# Patient Record
Sex: Female | Born: 1945
Health system: Southern US, Community
[De-identification: ages and names within clinical notes are randomized; demographics above are authoritative.]

## PROBLEM LIST (undated history)

## (undated) DIAGNOSIS — K219 Gastro-esophageal reflux disease without esophagitis: Secondary | ICD-10-CM

## (undated) DIAGNOSIS — M1990 Unspecified osteoarthritis, unspecified site: Secondary | ICD-10-CM

## (undated) DIAGNOSIS — F29 Unspecified psychosis not due to a substance or known physiological condition: Secondary | ICD-10-CM

## (undated) DIAGNOSIS — G4733 Obstructive sleep apnea (adult) (pediatric): Secondary | ICD-10-CM

## (undated) DIAGNOSIS — E041 Nontoxic single thyroid nodule: Secondary | ICD-10-CM

## (undated) DIAGNOSIS — G4700 Insomnia, unspecified: Secondary | ICD-10-CM

## (undated) DIAGNOSIS — M549 Dorsalgia, unspecified: Secondary | ICD-10-CM

## (undated) DIAGNOSIS — E785 Hyperlipidemia, unspecified: Secondary | ICD-10-CM

## (undated) DIAGNOSIS — I10 Essential (primary) hypertension: Secondary | ICD-10-CM

## (undated) DIAGNOSIS — R42 Dizziness and giddiness: Secondary | ICD-10-CM

## (undated) HISTORY — DX: Insomnia, unspecified: G47.00

## (undated) HISTORY — PX: UTERINE FIBROID SURGERY: SHX826

## (undated) HISTORY — DX: Unspecified psychosis not due to a substance or known physiological condition: F29

## (undated) HISTORY — DX: Nontoxic single thyroid nodule: E04.1

## (undated) HISTORY — DX: Obstructive sleep apnea (adult) (pediatric): G47.33

## (undated) HISTORY — DX: Unspecified osteoarthritis, unspecified site: M19.90

## (undated) HISTORY — PX: TONSILLECTOMY: SUR1361

## (undated) HISTORY — DX: Essential (primary) hypertension: I10

## (undated) HISTORY — DX: Hyperlipidemia, unspecified: E78.5

## (undated) HISTORY — DX: Dizziness and giddiness: R42

---

## 2004-01-06 ENCOUNTER — Encounter (HOSPITAL_COMMUNITY): Admission: RE | Admit: 2004-01-06 | Discharge: 2004-04-05 | Payer: Self-pay | Admitting: Family Medicine

## 2004-06-26 ENCOUNTER — Ambulatory Visit (HOSPITAL_COMMUNITY): Admission: RE | Admit: 2004-06-26 | Discharge: 2004-06-26 | Payer: Self-pay | Admitting: Internal Medicine

## 2004-06-26 ENCOUNTER — Ambulatory Visit: Payer: Self-pay | Admitting: Internal Medicine

## 2004-07-07 ENCOUNTER — Encounter (INDEPENDENT_AMBULATORY_CARE_PROVIDER_SITE_OTHER): Payer: Self-pay | Admitting: Cardiology

## 2004-07-07 ENCOUNTER — Ambulatory Visit (HOSPITAL_COMMUNITY): Admission: RE | Admit: 2004-07-07 | Discharge: 2004-07-07 | Payer: Self-pay | Admitting: Internal Medicine

## 2004-07-14 ENCOUNTER — Ambulatory Visit: Payer: Self-pay | Admitting: Internal Medicine

## 2004-07-27 ENCOUNTER — Ambulatory Visit: Payer: Self-pay | Admitting: Internal Medicine

## 2004-08-10 ENCOUNTER — Ambulatory Visit: Payer: Self-pay | Admitting: Internal Medicine

## 2004-09-24 ENCOUNTER — Ambulatory Visit: Payer: Self-pay | Admitting: Psychiatry

## 2004-09-24 ENCOUNTER — Ambulatory Visit: Payer: Self-pay | Admitting: Internal Medicine

## 2004-09-24 ENCOUNTER — Inpatient Hospital Stay (HOSPITAL_COMMUNITY): Admission: RE | Admit: 2004-09-24 | Discharge: 2004-10-01 | Payer: Self-pay | Admitting: Psychiatry

## 2004-11-18 ENCOUNTER — Ambulatory Visit: Payer: Self-pay | Admitting: Internal Medicine

## 2004-12-26 ENCOUNTER — Emergency Department (HOSPITAL_COMMUNITY): Admission: EM | Admit: 2004-12-26 | Discharge: 2004-12-26 | Payer: Self-pay | Admitting: Family Medicine

## 2005-03-05 ENCOUNTER — Ambulatory Visit: Payer: Self-pay | Admitting: Internal Medicine

## 2005-04-07 ENCOUNTER — Ambulatory Visit: Payer: Self-pay | Admitting: Internal Medicine

## 2005-04-09 ENCOUNTER — Ambulatory Visit: Payer: Self-pay | Admitting: Internal Medicine

## 2005-04-23 ENCOUNTER — Ambulatory Visit: Payer: Self-pay | Admitting: Internal Medicine

## 2005-05-07 ENCOUNTER — Ambulatory Visit: Payer: Self-pay | Admitting: Internal Medicine

## 2005-07-29 ENCOUNTER — Ambulatory Visit: Payer: Self-pay | Admitting: Internal Medicine

## 2005-11-30 DIAGNOSIS — E785 Hyperlipidemia, unspecified: Secondary | ICD-10-CM | POA: Insufficient documentation

## 2005-11-30 DIAGNOSIS — M67919 Unspecified disorder of synovium and tendon, unspecified shoulder: Secondary | ICD-10-CM | POA: Insufficient documentation

## 2005-11-30 DIAGNOSIS — R002 Palpitations: Secondary | ICD-10-CM | POA: Insufficient documentation

## 2005-11-30 DIAGNOSIS — F411 Generalized anxiety disorder: Secondary | ICD-10-CM | POA: Insufficient documentation

## 2005-11-30 DIAGNOSIS — I10 Essential (primary) hypertension: Secondary | ICD-10-CM | POA: Insufficient documentation

## 2005-11-30 DIAGNOSIS — G43909 Migraine, unspecified, not intractable, without status migrainosus: Secondary | ICD-10-CM | POA: Insufficient documentation

## 2005-11-30 DIAGNOSIS — R87615 Unsatisfactory cytologic smear of cervix: Secondary | ICD-10-CM | POA: Insufficient documentation

## 2005-11-30 DIAGNOSIS — M719 Bursopathy, unspecified: Secondary | ICD-10-CM

## 2005-11-30 DIAGNOSIS — M545 Low back pain, unspecified: Secondary | ICD-10-CM | POA: Insufficient documentation

## 2005-11-30 DIAGNOSIS — F29 Unspecified psychosis not due to a substance or known physiological condition: Secondary | ICD-10-CM | POA: Insufficient documentation

## 2005-12-10 ENCOUNTER — Ambulatory Visit: Payer: Self-pay | Admitting: Internal Medicine

## 2005-12-10 ENCOUNTER — Ambulatory Visit (HOSPITAL_COMMUNITY): Admission: RE | Admit: 2005-12-10 | Discharge: 2005-12-10 | Payer: Self-pay | Admitting: Internal Medicine

## 2006-01-20 ENCOUNTER — Ambulatory Visit: Payer: Self-pay | Admitting: Internal Medicine

## 2006-01-22 ENCOUNTER — Ambulatory Visit (HOSPITAL_COMMUNITY): Admission: RE | Admit: 2006-01-22 | Discharge: 2006-01-22 | Payer: Self-pay | Admitting: Internal Medicine

## 2006-01-25 ENCOUNTER — Ambulatory Visit: Payer: Self-pay | Admitting: Internal Medicine

## 2006-02-18 ENCOUNTER — Telehealth (INDEPENDENT_AMBULATORY_CARE_PROVIDER_SITE_OTHER): Payer: Self-pay | Admitting: *Deleted

## 2006-03-17 ENCOUNTER — Encounter: Admission: RE | Admit: 2006-03-17 | Discharge: 2006-03-17 | Payer: Self-pay | Admitting: Hospitalist

## 2006-03-17 ENCOUNTER — Encounter (INDEPENDENT_AMBULATORY_CARE_PROVIDER_SITE_OTHER): Payer: Self-pay | Admitting: Hospitalist

## 2006-03-17 ENCOUNTER — Ambulatory Visit: Payer: Self-pay | Admitting: Internal Medicine

## 2006-03-17 LAB — CONVERTED CEMR LAB
Cholesterol: 135 mg/dL (ref 0–200)
HDL: 58 mg/dL (ref >39)
LDL Cholesterol: 59 mg/dL (ref 0–99)
Total CHOL/HDL Ratio: 2.3
Triglycerides: 88 mg/dL (ref <150)
VLDL: 18 mg/dL (ref 0–40)

## 2006-03-21 ENCOUNTER — Encounter: Admission: RE | Admit: 2006-03-21 | Discharge: 2006-05-25 | Payer: Self-pay | Admitting: Sports Medicine

## 2006-03-23 ENCOUNTER — Telehealth (INDEPENDENT_AMBULATORY_CARE_PROVIDER_SITE_OTHER): Payer: Self-pay | Admitting: *Deleted

## 2006-04-14 ENCOUNTER — Ambulatory Visit: Payer: Self-pay | Admitting: Internal Medicine

## 2006-07-28 ENCOUNTER — Ambulatory Visit: Payer: Self-pay | Admitting: Internal Medicine

## 2006-07-28 DIAGNOSIS — G4700 Insomnia, unspecified: Secondary | ICD-10-CM | POA: Insufficient documentation

## 2006-08-26 ENCOUNTER — Encounter (INDEPENDENT_AMBULATORY_CARE_PROVIDER_SITE_OTHER): Payer: Self-pay | Admitting: *Deleted

## 2006-11-03 ENCOUNTER — Telehealth: Payer: Self-pay | Admitting: *Deleted

## 2006-12-15 ENCOUNTER — Telehealth: Payer: Self-pay | Admitting: *Deleted

## 2007-01-25 ENCOUNTER — Ambulatory Visit: Payer: Self-pay | Admitting: *Deleted

## 2007-01-25 DIAGNOSIS — T148XXA Other injury of unspecified body region, initial encounter: Secondary | ICD-10-CM | POA: Insufficient documentation

## 2007-01-25 LAB — CONVERTED CEMR LAB
ALT: 10 units/L (ref 0–35)
AST: 13 units/L (ref 0–37)
Albumin: 4.3 g/dL (ref 3.5–5.2)
Alkaline Phosphatase: 57 units/L (ref 39–117)
BUN: 9 mg/dL (ref 6–23)
CO2: 27 meq/L (ref 19–32)
Calcium: 9.4 mg/dL (ref 8.4–10.5)
Chloride: 106 meq/L (ref 96–112)
Creatinine, Ser: 0.76 mg/dL (ref 0.40–1.20)
Glucose, Bld: 84 mg/dL (ref 70–99)
HCT: 40.8 % (ref 36.0–46.0)
Hemoglobin: 13.2 g/dL (ref 12.0–15.0)
MCHC: 32.4 g/dL (ref 30.0–36.0)
MCV: 87.7 fL (ref 78.0–100.0)
Platelets: 191 10*3/uL (ref 150–400)
Potassium: 4.3 meq/L (ref 3.5–5.3)
RBC: 4.65 M/uL (ref 3.87–5.11)
RDW: 15.2 % (ref 11.5–15.5)
Sodium: 144 meq/L (ref 135–145)
Total Bilirubin: 0.4 mg/dL (ref 0.3–1.2)
Total Protein: 7 g/dL (ref 6.0–8.3)
WBC: 7.5 10*3/uL (ref 4.0–10.5)

## 2007-07-03 ENCOUNTER — Encounter: Admission: RE | Admit: 2007-07-03 | Discharge: 2007-07-03 | Payer: Self-pay | Admitting: *Deleted

## 2007-07-03 ENCOUNTER — Ambulatory Visit (HOSPITAL_COMMUNITY): Admission: RE | Admit: 2007-07-03 | Discharge: 2007-07-03 | Payer: Self-pay | Admitting: Internal Medicine

## 2007-07-03 ENCOUNTER — Encounter: Payer: Self-pay | Admitting: Internal Medicine

## 2007-07-03 ENCOUNTER — Telehealth (INDEPENDENT_AMBULATORY_CARE_PROVIDER_SITE_OTHER): Payer: Self-pay | Admitting: *Deleted

## 2007-07-03 ENCOUNTER — Ambulatory Visit: Payer: Self-pay | Admitting: *Deleted

## 2007-07-03 LAB — CONVERTED CEMR LAB
ALT: 13 units/L (ref 0–35)
AST: 19 units/L (ref 0–37)
Albumin: 4 g/dL (ref 3.5–5.2)
Alkaline Phosphatase: 56 units/L (ref 39–117)
BUN: 9 mg/dL (ref 6–23)
Basophils Absolute: 0.1 10*3/uL (ref 0.0–0.1)
Basophils Relative: 1 % (ref 0–1)
CO2: 28 meq/L (ref 19–32)
Calcium: 9.4 mg/dL (ref 8.4–10.5)
Chloride: 104 meq/L (ref 96–112)
Creatinine, Ser: 0.78 mg/dL (ref 0.40–1.20)
Eosinophils Absolute: 0.4 10*3/uL (ref 0.0–0.7)
Eosinophils Relative: 5 % (ref 0–5)
Glucose, Bld: 98 mg/dL (ref 70–99)
HCT: 39.5 % (ref 36.0–46.0)
Hemoglobin: 13.6 g/dL (ref 12.0–15.0)
INR: 1 (ref 0.0–1.5)
Lymphocytes Relative: 25 % (ref 12–46)
Lymphs Abs: 2.1 10*3/uL (ref 0.7–4.0)
MCHC: 34.3 g/dL (ref 30.0–36.0)
MCV: 86.9 fL (ref 78.0–100.0)
Monocytes Absolute: 0.6 10*3/uL (ref 0.1–1.0)
Monocytes Relative: 7 % (ref 3–12)
Neutro Abs: 5.4 10*3/uL (ref 1.7–7.7)
Neutrophils Relative %: 63 % (ref 43–77)
Platelets: 155 10*3/uL (ref 150–400)
Potassium: 4.2 meq/L (ref 3.5–5.3)
Prothrombin Time: 13.6 s (ref 11.6–15.2)
RBC: 4.54 M/uL (ref 3.87–5.11)
RDW: 14.7 % (ref 11.5–15.5)
Sodium: 140 meq/L (ref 135–145)
TSH: 0.167 microintl units/mL — ABNORMAL LOW (ref 0.350–5.50)
Total Bilirubin: 0.6 mg/dL (ref 0.3–1.2)
Total Protein: 6.7 g/dL (ref 6.0–8.3)
WBC: 8.6 10*3/uL (ref 4.0–10.5)
aPTT: 31 s

## 2007-07-25 ENCOUNTER — Encounter (INDEPENDENT_AMBULATORY_CARE_PROVIDER_SITE_OTHER): Payer: Self-pay | Admitting: *Deleted

## 2007-07-25 ENCOUNTER — Ambulatory Visit: Payer: Self-pay | Admitting: *Deleted

## 2007-07-25 DIAGNOSIS — R791 Abnormal coagulation profile: Secondary | ICD-10-CM | POA: Insufficient documentation

## 2007-07-25 LAB — CONVERTED CEMR LAB
ALT: 9 units/L (ref 0–35)
AST: 14 units/L (ref 0–37)
Albumin: 4.4 g/dL (ref 3.5–5.2)
Alkaline Phosphatase: 55 units/L (ref 39–117)
BUN: 8 mg/dL (ref 6–23)
CO2: 24 meq/L (ref 19–32)
Calcium: 9.2 mg/dL (ref 8.4–10.5)
Chlamydia, DNA Probe: NEGATIVE
Chloride: 106 meq/L (ref 96–112)
Cholesterol: 159 mg/dL (ref 0–200)
Creatinine, Ser: 0.66 mg/dL (ref 0.40–1.20)
Free T4: 1.12 ng/dL (ref 0.89–1.80)
GC Probe Amp, Genital: NEGATIVE
Glucose, Bld: 88 mg/dL (ref 70–99)
HDL: 74 mg/dL (ref >39)
LDL Cholesterol: 71 mg/dL (ref 0–99)
Potassium: 3.8 meq/L (ref 3.5–5.3)
Sodium: 142 meq/L (ref 135–145)
Total Bilirubin: 0.4 mg/dL (ref 0.3–1.2)
Total CHOL/HDL Ratio: 2.1
Total Protein: 7 g/dL (ref 6.0–8.3)
Triglycerides: 71 mg/dL (ref <150)
VLDL: 14 mg/dL (ref 0–40)

## 2007-09-29 ENCOUNTER — Encounter (INDEPENDENT_AMBULATORY_CARE_PROVIDER_SITE_OTHER): Payer: Self-pay | Admitting: *Deleted

## 2007-09-29 LAB — HM MAMMOGRAPHY: HM Mammogram: NEGATIVE

## 2007-10-04 ENCOUNTER — Telehealth (INDEPENDENT_AMBULATORY_CARE_PROVIDER_SITE_OTHER): Payer: Self-pay | Admitting: *Deleted

## 2007-12-06 ENCOUNTER — Telehealth: Payer: Self-pay | Admitting: *Deleted

## 2007-12-07 DIAGNOSIS — M79609 Pain in unspecified limb: Secondary | ICD-10-CM | POA: Insufficient documentation

## 2008-02-16 DIAGNOSIS — E041 Nontoxic single thyroid nodule: Secondary | ICD-10-CM

## 2008-02-16 HISTORY — DX: Nontoxic single thyroid nodule: E04.1

## 2008-03-19 ENCOUNTER — Encounter (INDEPENDENT_AMBULATORY_CARE_PROVIDER_SITE_OTHER): Payer: Self-pay | Admitting: *Deleted

## 2008-04-09 ENCOUNTER — Ambulatory Visit: Payer: Self-pay | Admitting: *Deleted

## 2008-04-10 LAB — CONVERTED CEMR LAB
ALT: 9 units/L (ref 0–35)
AST: 14 units/L (ref 0–37)
Albumin: 4.2 g/dL (ref 3.5–5.2)
Alkaline Phosphatase: 64 units/L (ref 39–117)
BUN: 7 mg/dL (ref 6–23)
CO2: 25 meq/L (ref 19–32)
Calcium: 9 mg/dL (ref 8.4–10.5)
Chloride: 104 meq/L (ref 96–112)
Cholesterol: 147 mg/dL (ref 0–200)
Creatinine, Ser: 0.68 mg/dL (ref 0.40–1.20)
Glucose, Bld: 72 mg/dL (ref 70–99)
HCT: 43.6 % (ref 36.0–46.0)
HDL: 66 mg/dL (ref >39)
Hemoglobin: 14 g/dL (ref 12.0–15.0)
LDL Cholesterol: 61 mg/dL (ref 0–99)
MCHC: 32.1 g/dL (ref 30.0–36.0)
MCV: 89.7 fL (ref 78.0–100.0)
Platelets: 169 10*3/uL (ref 150–400)
Potassium: 3.6 meq/L (ref 3.5–5.3)
RBC: 4.86 M/uL (ref 3.87–5.11)
RDW: 14.8 % (ref 11.5–15.5)
Sodium: 143 meq/L (ref 135–145)
TSH: 0.19 microintl units/mL — ABNORMAL LOW (ref 0.350–4.50)
Total Bilirubin: 0.4 mg/dL (ref 0.3–1.2)
Total CHOL/HDL Ratio: 2.2
Total Protein: 6.9 g/dL (ref 6.0–8.3)
Triglycerides: 98 mg/dL (ref <150)
VLDL: 20 mg/dL (ref 0–40)
WBC: 6.4 10*3/uL (ref 4.0–10.5)

## 2008-04-11 ENCOUNTER — Encounter: Admission: RE | Admit: 2008-04-11 | Discharge: 2008-04-11 | Payer: Self-pay

## 2008-04-11 ENCOUNTER — Other Ambulatory Visit: Admission: RE | Admit: 2008-04-11 | Discharge: 2008-04-11 | Payer: Self-pay | Admitting: Interventional Radiology

## 2008-04-11 ENCOUNTER — Encounter (INDEPENDENT_AMBULATORY_CARE_PROVIDER_SITE_OTHER): Payer: Self-pay | Admitting: *Deleted

## 2008-04-11 ENCOUNTER — Encounter (INDEPENDENT_AMBULATORY_CARE_PROVIDER_SITE_OTHER): Payer: Self-pay | Admitting: Interventional Radiology

## 2008-04-11 ENCOUNTER — Ambulatory Visit: Payer: Self-pay | Admitting: Internal Medicine

## 2008-04-18 LAB — CONVERTED CEMR LAB
Free T4: 1 ng/dL (ref 0.89–1.80)
TSH: 0.179 microintl units/mL — ABNORMAL LOW (ref 0.350–4.50)

## 2008-05-06 ENCOUNTER — Encounter (INDEPENDENT_AMBULATORY_CARE_PROVIDER_SITE_OTHER): Payer: Self-pay | Admitting: Internal Medicine

## 2008-05-06 ENCOUNTER — Telehealth: Payer: Self-pay | Admitting: *Deleted

## 2008-05-06 ENCOUNTER — Ambulatory Visit: Payer: Self-pay | Admitting: Internal Medicine

## 2008-05-07 LAB — CONVERTED CEMR LAB
BUN: 11 mg/dL (ref 6–23)
Basophils Absolute: 0 10*3/uL (ref 0.0–0.1)
Basophils Relative: 0 % (ref 0–1)
CO2: 28 meq/L (ref 19–32)
Calcium: 9.3 mg/dL (ref 8.4–10.5)
Chloride: 105 meq/L (ref 96–112)
Creatinine, Ser: 0.65 mg/dL (ref 0.40–1.20)
Eosinophils Absolute: 0.3 10*3/uL (ref 0.0–0.7)
Eosinophils Relative: 4 % (ref 0–5)
Free T4: 0.9 ng/dL (ref 0.89–1.80)
Glucose, Bld: 88 mg/dL (ref 70–99)
HCT: 40.1 % (ref 36.0–46.0)
Hemoglobin: 13.5 g/dL (ref 12.0–15.0)
Lymphocytes Relative: 37 % (ref 12–46)
Lymphs Abs: 3 10*3/uL (ref 0.7–4.0)
MCHC: 33.7 g/dL (ref 30.0–36.0)
MCV: 87 fL (ref 78.0–100.0)
Monocytes Absolute: 0.7 10*3/uL (ref 0.1–1.0)
Monocytes Relative: 8 % (ref 3–12)
Neutro Abs: 4.1 10*3/uL (ref 1.7–7.7)
Neutrophils Relative %: 51 % (ref 43–77)
Platelets: 156 10*3/uL (ref 150–400)
Potassium: 3.8 meq/L (ref 3.5–5.3)
RBC: 4.61 M/uL (ref 3.87–5.11)
RDW: 14.7 % (ref 11.5–15.5)
Sodium: 142 meq/L (ref 135–145)
TSH: 0.296 microintl units/mL — ABNORMAL LOW (ref 0.350–4.500)
WBC: 8 10*3/uL (ref 4.0–10.5)

## 2008-07-17 ENCOUNTER — Ambulatory Visit: Payer: Self-pay | Admitting: Internal Medicine

## 2008-07-17 ENCOUNTER — Ambulatory Visit (HOSPITAL_COMMUNITY): Admission: RE | Admit: 2008-07-17 | Discharge: 2008-07-17 | Payer: Self-pay | Admitting: Internal Medicine

## 2008-08-06 ENCOUNTER — Ambulatory Visit: Payer: Self-pay | Admitting: *Deleted

## 2008-08-06 LAB — CONVERTED CEMR LAB
OCCULT 1: NEGATIVE
OCCULT 2: NEGATIVE
OCCULT 3: NEGATIVE

## 2008-08-06 LAB — FECAL OCCULT BLOOD, GUAIAC: Fecal Occult Blood: NEGATIVE

## 2008-09-18 ENCOUNTER — Telehealth: Payer: Self-pay | Admitting: *Deleted

## 2008-10-14 ENCOUNTER — Telehealth: Payer: Self-pay | Admitting: Internal Medicine

## 2008-10-29 ENCOUNTER — Telehealth: Payer: Self-pay | Admitting: Internal Medicine

## 2008-10-29 DIAGNOSIS — M949 Disorder of cartilage, unspecified: Secondary | ICD-10-CM

## 2008-10-29 DIAGNOSIS — M899 Disorder of bone, unspecified: Secondary | ICD-10-CM | POA: Insufficient documentation

## 2008-11-05 ENCOUNTER — Encounter: Payer: Self-pay | Admitting: Internal Medicine

## 2008-11-07 ENCOUNTER — Telehealth: Payer: Self-pay | Admitting: Internal Medicine

## 2008-11-07 ENCOUNTER — Encounter: Payer: Self-pay | Admitting: Internal Medicine

## 2008-11-14 ENCOUNTER — Telehealth: Payer: Self-pay | Admitting: Internal Medicine

## 2008-11-15 ENCOUNTER — Emergency Department (HOSPITAL_COMMUNITY): Admission: EM | Admit: 2008-11-15 | Discharge: 2008-11-15 | Payer: Self-pay | Admitting: Emergency Medicine

## 2008-11-15 ENCOUNTER — Telehealth: Payer: Self-pay | Admitting: Internal Medicine

## 2008-11-19 ENCOUNTER — Ambulatory Visit: Payer: Self-pay | Admitting: Internal Medicine

## 2008-11-19 DIAGNOSIS — H8110 Benign paroxysmal vertigo, unspecified ear: Secondary | ICD-10-CM | POA: Insufficient documentation

## 2009-03-24 ENCOUNTER — Emergency Department (HOSPITAL_COMMUNITY): Admission: EM | Admit: 2009-03-24 | Discharge: 2009-03-25 | Payer: Self-pay | Admitting: Emergency Medicine

## 2009-03-24 ENCOUNTER — Emergency Department (HOSPITAL_COMMUNITY): Admission: EM | Admit: 2009-03-24 | Discharge: 2009-03-24 | Payer: Self-pay | Admitting: Emergency Medicine

## 2009-07-22 ENCOUNTER — Telehealth: Payer: Self-pay | Admitting: Internal Medicine

## 2009-08-08 ENCOUNTER — Other Ambulatory Visit: Payer: Self-pay | Admitting: Emergency Medicine

## 2009-08-08 ENCOUNTER — Ambulatory Visit: Payer: Self-pay | Admitting: Psychiatry

## 2009-08-08 ENCOUNTER — Inpatient Hospital Stay (HOSPITAL_COMMUNITY): Admission: AD | Admit: 2009-08-08 | Discharge: 2009-08-12 | Payer: Self-pay | Admitting: Psychiatry

## 2009-09-04 ENCOUNTER — Encounter: Payer: Self-pay | Admitting: Internal Medicine

## 2009-09-09 ENCOUNTER — Ambulatory Visit: Payer: Self-pay | Admitting: Psychiatry

## 2010-01-06 ENCOUNTER — Encounter: Payer: Self-pay | Admitting: Internal Medicine

## 2010-03-17 NOTE — Progress Notes (Signed)
Summary: med refill/gp  Phone Note Refill Request Message from:  Fax from Pharmacy on July 22, 2009 12:27 PM  Refills Requested: Medication #1:  ACCUPRIL 20 MG TABS Take 1 tablet by mouth once a day   Last Refilled: 06/17/2009  Medication #2:  ZOCOR 80 MG TABS Take 1 tablet by mouth once a day   Last Refilled: 06/17/2009 Last appt. Oct. 5, 2010.   Method Requested: Electronic Initial call taken by: Chinita Pester RN,  July 22, 2009 12:27 PM  Follow-up for Phone Call        completed refill, thank you Iskra  Follow-up by: Mliss Sax MD,  July 22, 2009 3:33 PM    Prescriptions: NORVASC 5 MG TABS (AMLODIPINE BESYLATE) Take 1 tablet by mouth once a day  #30 Tablet x 11   Entered and Authorized by:   Mliss Sax MD   Signed by:   Mliss Sax MD on 07/22/2009   Method used:   Electronically to        [facility] (retail)       [address]       [place], [place]  [address]       Ph: [phone]       Fax: [phone]   RxID:   [id number] ZOCOR 80 MG TABS (SIMVASTATIN) Take 1 tablet by mouth once a day  #30 x 11   Entered and Authorized by:   Mliss Sax MD   Signed by:   Mliss Sax MD on 07/22/2009   Method used:   Electronically to        [facility] (retail)       [address]       [place], [place]  [address]       Ph: [phone]       Fax: [phone]   RxID:   [id number] ACCUPRIL 20 MG TABS (QUINAPRIL HCL) Take 1 tablet by mouth once a day  #30 x 11   Entered and Authorized by:   Mliss Sax MD   Signed by:   Mliss Sax MD on 07/22/2009   Method used:   Electronically to        [facility] (retail)       [address]       [place], [place]  [address]       Ph: [phone]       Fax: [phone]   RxID:   [id number]

## 2010-03-17 NOTE — Assessment & Plan Note (Signed)
Summary: med change  Prescriptions: ZOCOR 40 MG TABS (SIMVASTATIN) Take 1 tab by mouth at bedtime  #30 x 5   Entered and Authorized by:   Mliss Sax MD   Signed by:   Mliss Sax MD on 09/04/2009   Method used:   Electronically to        [facility] [address] (retail)       [phone]       [place], [place]  [address]       Ph: [phone]       Fax: [phone]   RxID:   [id number]

## 2010-03-21 ENCOUNTER — Other Ambulatory Visit: Payer: Self-pay | Admitting: *Deleted

## 2010-03-23 MED ORDER — AMLODIPINE BESYLATE 5 MG PO TABS: 5.0000 mg | ORAL_TABLET | Freq: Every day | ORAL | Status: DC

## 2010-03-27 ENCOUNTER — Encounter: Payer: Self-pay | Admitting: Internal Medicine

## 2010-03-27 ENCOUNTER — Ambulatory Visit (INDEPENDENT_AMBULATORY_CARE_PROVIDER_SITE_OTHER): Payer: Medicare Other | Admitting: Internal Medicine

## 2010-03-27 VITALS — BP 125/85 | HR 86 | Temp 98.3°F | Ht 63.0 in | Wt 150.0 lb

## 2010-03-27 DIAGNOSIS — M545 Low back pain, unspecified: Secondary | ICD-10-CM

## 2010-03-27 DIAGNOSIS — G4700 Insomnia, unspecified: Secondary | ICD-10-CM

## 2010-03-27 DIAGNOSIS — E785 Hyperlipidemia, unspecified: Secondary | ICD-10-CM

## 2010-03-27 DIAGNOSIS — I10 Essential (primary) hypertension: Secondary | ICD-10-CM

## 2010-03-27 DIAGNOSIS — E041 Nontoxic single thyroid nodule: Secondary | ICD-10-CM

## 2010-03-27 LAB — COMPREHENSIVE METABOLIC PANEL
Alkaline Phosphatase: 67 U/L (ref 39–117)
Glucose, Bld: 86 mg/dL (ref 70–99)
Sodium: 142 mEq/L (ref 135–145)
Total Bilirubin: 0.4 mg/dL (ref 0.3–1.2)
Total Protein: 7.2 g/dL (ref 6.0–8.3)

## 2010-03-27 LAB — LIPID PANEL
Cholesterol: 179 mg/dL (ref 0–200)
HDL: 60 mg/dL (ref >39)
LDL Cholesterol: 97 mg/dL (ref 0–99)
Total CHOL/HDL Ratio: 3 Ratio
Triglycerides: 110 mg/dL (ref <150)
VLDL: 22 mg/dL (ref 0–40)

## 2010-03-27 MED ORDER — ZOLPIDEM TARTRATE 5 MG PO TABS: 5.0000 mg | ORAL_TABLET | Freq: Every evening | ORAL | Status: DC | PRN

## 2010-03-27 MED ORDER — ACETAMINOPHEN-CODEINE 300-30 MG PO TABS: 1.0000 | ORAL_TABLET | Freq: Three times a day (TID) | ORAL | Status: DC | PRN

## 2010-03-27 MED ORDER — CLORAZEPATE DIPOTASSIUM 7.5 MG PO TABS: 7.5000 mg | ORAL_TABLET | Freq: Three times a day (TID) | ORAL | Status: DC

## 2010-03-27 MED ORDER — MECLIZINE HCL 25 MG PO TABS: 25.0000 mg | ORAL_TABLET | Freq: Three times a day (TID) | ORAL | Status: DC | PRN

## 2010-03-27 MED ORDER — QUINAPRIL HCL 20 MG PO TABS: 20.0000 mg | ORAL_TABLET | Freq: Every day | ORAL | Status: DC

## 2010-03-27 MED ORDER — AMLODIPINE BESYLATE 5 MG PO TABS: 5.0000 mg | ORAL_TABLET | Freq: Every day | ORAL | Status: DC

## 2010-03-27 MED ORDER — RANITIDINE HCL 150 MG PO TABS: 150.0000 mg | ORAL_TABLET | Freq: Every day | ORAL | Status: DC

## 2010-03-27 MED ORDER — SIMVASTATIN 40 MG PO TABS: 40.0000 mg | ORAL_TABLET | Freq: Every day | ORAL | Status: DC

## 2010-03-27 MED ORDER — CITALOPRAM HYDROBROMIDE 40 MG PO TABS: 60.0000 mg | ORAL_TABLET | Freq: Every day | ORAL | Status: DC

## 2010-03-27 NOTE — Assessment & Plan Note (Signed)
Well controlled on current pain medication, I have also encouraged exercise daily and continuous weight loss.

## 2010-03-27 NOTE — Assessment & Plan Note (Signed)
Pt only takes as needed and not every day, will provide refills as needed.

## 2010-03-27 NOTE — Progress Notes (Signed)
  Subjective:    Patient ID: Doris [name], female    DOB: 10-16-1945, 65 y.o.   MRN: [id number]  HPI 65 yo female with PMH outlined below presents to [facility] for regular follow up and would like to get refills on her medications. She reports compliance with her BP and cholesterol medications and also occasionally has to take pain medication for her low back pain. She tells me that this is chronic pain for her and it is in the low back area, only occasionally radiates down to her right leg, pain medication help with symptoms and she takes 1-2 tablets per day. She is able to walk and work out and is trying to exercise daily. She denies any falls or recent traumas, no recent sicknesses or hospitalizations, no episodes of chest pain or palpitations, no abdominal or urinary concerns, no blood in urine or stool. She also reports having recent mammogram done which she was told has some densities and she would like to know the results.    Review of Systems  Constitutional: Negative.   HENT: Negative.   Respiratory: Negative.   Cardiovascular: Negative.   Gastrointestinal: Negative.   Genitourinary: Negative.   Musculoskeletal: Positive for back pain. Negative for joint swelling, arthralgias and gait problem.  Hematological: Negative.   Psychiatric/Behavioral: Negative.        Objective:   Physical Exam  Constitutional: She appears well-developed and well-nourished. No distress.  HENT:  Head: Normocephalic and atraumatic.  Right Ear: External ear normal.  Left Ear: External ear normal.  Nose: Nose normal.  Mouth/Throat: Oropharynx is clear and moist. No oropharyngeal exudate.  Eyes: Conjunctivae and EOM are normal. Pupils are equal, round, and reactive to light. Right eye exhibits no discharge. Left eye exhibits no discharge. No scleral icterus.  Neck: Normal range of motion. Neck supple. No JVD present. No tracheal deviation present. No thyromegaly present.  Cardiovascular: Normal rate,  regular rhythm, normal heart sounds and intact distal pulses.  Exam reveals no gallop and no friction rub.   No murmur heard. Pulmonary/Chest: Effort normal and breath sounds normal. No stridor. No respiratory distress. She has no wheezes. She has no rales. She exhibits tenderness.  Abdominal: Soft. Bowel sounds are normal. She exhibits no distension and no mass. There is no tenderness. There is no rebound and no guarding.  Musculoskeletal: Normal range of motion. She exhibits no edema.       Lumbar back: She exhibits tenderness and pain. She exhibits normal range of motion, no bony tenderness, no swelling, no edema, no deformity, no laceration, no spasm and normal pulse.  Lymphadenopathy:    She has no cervical adenopathy.  Skin: Skin is warm and dry. No rash noted. She is not diaphoretic. No erythema. No pallor.  Psychiatric: She has a normal mood and affect. Her behavior is normal. Judgment and thought content normal.          Assessment & Plan:

## 2010-03-27 NOTE — Assessment & Plan Note (Signed)
Will recheck TSH today and will follow up on results.

## 2010-03-27 NOTE — Patient Instructions (Signed)
Please schedule an appointment for follow up in 3 months.

## 2010-03-27 NOTE — Assessment & Plan Note (Signed)
Will check FLP today and will readjust the regimen as indicated.

## 2010-03-27 NOTE — Assessment & Plan Note (Signed)
At goal, will continue same medications today and will also check electrolyte panel.

## 2010-05-03 LAB — DIFFERENTIAL
Basophils Relative: 1 % (ref 0–1)
Eosinophils Absolute: 0.3 10*3/uL (ref 0.0–0.7)
Eosinophils Relative: 5 % (ref 0–5)
Monocytes Absolute: 0.5 10*3/uL (ref 0.1–1.0)
Monocytes Relative: 8 % (ref 3–12)
Neutro Abs: 3.3 10*3/uL (ref 1.7–7.7)

## 2010-05-03 LAB — COMPREHENSIVE METABOLIC PANEL
Alkaline Phosphatase: 55 U/L (ref 39–117)
BUN: 4 mg/dL — ABNORMAL LOW (ref 6–23)
CO2: 27 mEq/L (ref 19–32)
Calcium: 8.7 mg/dL (ref 8.4–10.5)
Creatinine, Ser: 0.65 mg/dL (ref 0.4–1.2)
GFR calc Af Amer: >60 mL/min (ref >60)
Glucose, Bld: 94 mg/dL (ref 70–99)
Total Bilirubin: 0.5 mg/dL (ref 0.3–1.2)

## 2010-05-03 LAB — URINALYSIS, ROUTINE W REFLEX MICROSCOPIC
Glucose, UA: NEGATIVE mg/dL
Ketones, ur: NEGATIVE mg/dL
pH: 7.5 (ref 5.0–8.0)

## 2010-05-03 LAB — CBC
HCT: 40.7 % (ref 36.0–46.0)
Hemoglobin: 13.8 g/dL (ref 12.0–15.0)
MCH: 30.3 pg (ref 26.0–34.0)
MCHC: 33.8 g/dL (ref 30.0–36.0)
MCV: 89.7 fL (ref 78.0–100.0)

## 2010-05-03 LAB — ACETAMINOPHEN LEVEL: Acetaminophen (Tylenol), Serum: <10 ug/mL — ABNORMAL LOW (ref 10–30)

## 2010-05-03 LAB — RAPID URINE DRUG SCREEN, HOSP PERFORMED
Benzodiazepines: POSITIVE — AB
Cocaine: NOT DETECTED

## 2010-05-06 LAB — POCT CARDIAC MARKERS
CKMB, poc: <1 ng/mL — ABNORMAL LOW (ref 1.0–8.0)
Myoglobin, poc: 44.6 ng/mL (ref 12–200)
Myoglobin, poc: 47.3 ng/mL (ref 12–200)

## 2010-05-06 LAB — POCT I-STAT, CHEM 8
BUN: 9 mg/dL (ref 6–23)
Creatinine, Ser: 0.9 mg/dL (ref 0.4–1.2)
Glucose, Bld: 89 mg/dL (ref 70–99)
Sodium: 138 mEq/L (ref 135–145)
TCO2: 28 mmol/L (ref 0–100)

## 2010-05-21 LAB — URINALYSIS, ROUTINE W REFLEX MICROSCOPIC
Glucose, UA: NEGATIVE mg/dL
Hgb urine dipstick: NEGATIVE
Ketones, ur: NEGATIVE mg/dL
Protein, ur: NEGATIVE mg/dL
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
## 2010-07-03 NOTE — Discharge Summary (Signed)
NAMESALONI, LABLANC              ACCOUNT NO.:  [id number]   MEDICAL RECORD NO.:  [id number]          PATIENT TYPE:  IPS   LOCATION:  0400                          [facility]ILITY:  [facility]   PHYSICIAN:  Anselm Jungling, MD  DATE OF BIRTH:  08/05/1945   DATE OF ADMISSION:  09/24/2004  DATE OF DISCHARGE:  10/01/2004                                 DISCHARGE SUMMARY   IDENTIFYING DATA/REASON FOR ADMISSION:  This was a [facility]  admission for Herbst, a 65 year old divorced African-American female who  was referred via the [facility].  She was admitted  due to increasing auditory and visual hallucinations, depression, agitation,  pacing, suicidal ideation, and decreased sleep and appetite.  She came to [place]  with a prior history of psychosis that was poorly defined in the historical  database at the time of admission.  She had been seeing Frederick Peers at  [facility] of the [place].   ADMISSION MEDICATIONS:  Prozac 20 mg daily, Valium 5 mg b.i.d. p.r.n.,  Tranxene 7.5 mg q.h.s., and medical medications including atenolol, Vytorin,  hydrochlorothiazide, Pepcid, ASA, lisinopril, calcium, and vitamin D.   Please refer to the admission note for further details pertaining to the  symptoms, circumstances and history that led to her hospitalization at [facility].   INITIAL DIAGNOSTIC IMPRESSION:  AXIS I:  Rule out psychosis not otherwise  specified.  Rule out conversion disorder.  AXIS II:  Deferred.  AXIS III:  History of hypertension and migraines.  AXIS IV:  Stressors:  Severe.  AXIS V:  GAF on admission 30.   MEDICAL/LABORATORY:  The patient had just been medically evaluated at [facility].  A TSH level was reduced at 0.14.  CBC was  within normal limits.   HOSPITAL COURSE:  The patient was admitted to the adult inpatient  psychiatric service.  She was continued on medications as above for  hypertension, GERD, and calcium  with vitamin D.  She was started on a trial  of Zyprexa 2.5 mg q.a.m. and 5 mg q.h.s.  The morning dose of Zyprexa  appeared to be too sedating for her, so her order was changed such that she  received all 7.5 mg of Zyprexa at q.h.s. which was better tolerated.  She  was also started on a trial of Risperdal 0.5 mg b.i.d.  On the fifth  hospital day, Valium was increased to 5 mg t.i.d.  At the time of admission,  she had not been continued on her usual Valium 5 mg b.i.d., but it was clear  that without Valium, her anxiety level was rising precipitously.  Also,  Cogentin 1 mg b.i.d. was instituted to address emerging EPS.   By the seventh hospital day, the patient was feeling fairly well, and  reported significantly reduced auditory hallucinations.  Her mood was also  improved with a reduction in depression and sadness.  Her eating and  sleeping were stable.   AFTERCARE:  The patient was discharged on the eighth hospital day.  She was  to have an appointment at the crisis  center in [place], [place] on  October 02, 2004.  She was to follow up with Frederick Peers on October 05, 2004.   DISCHARGE MEDICATIONS:  1.  Prozac 20 mg p.o. q.d.  2.  Zyprexa 7.5 mg q.h.s.  3.  Risperdal 0.5 mg b.i.d.  4.  Cogentin 1 mg b.i.d.  5.  Valium 5 mg t.i.d.  6.  Vytorin 10/40 mg q.d.  7.  Hydrochlorothiazide 25 mg.  8.  ASA 81 mg q.d.  9.  Calcium carbonate 1 tablet q.d.  10. Pepcid 40 mg q.d.  11. Lisinopril 20 mg q.d.  12. Tenormin 100 mg p.o. q.d.   DISCHARGE DIAGNOSES:  AXIS I:  Psychosis not otherwise specified.  Mood  disorder not otherwise specified.  AXIS II:  Deferred.  AXIS III:  History of hypertension, migraines, gastroesophageal reflux  disease.  AXIS IV:  Stressors:  Severe.  AXIS V:  GAF on discharge 55.   FOLLOW UP:  The patient was to continue to have medical follow-up at the  [facility].           ______________________________  Anselm Jungling, MD  Electronically Signed     SPB/MEDQ  D:  10/27/2004  T:  10/27/2004  Job:  [id number]

## 2010-09-28 ENCOUNTER — Ambulatory Visit (INDEPENDENT_AMBULATORY_CARE_PROVIDER_SITE_OTHER): Payer: Medicare Other | Admitting: Internal Medicine

## 2010-09-28 ENCOUNTER — Encounter: Payer: Self-pay | Admitting: Internal Medicine

## 2010-09-28 VITALS — BP 122/77 | HR 85 | Temp 98.3°F | Ht 65.0 in | Wt 150.1 lb

## 2010-09-28 DIAGNOSIS — E041 Nontoxic single thyroid nodule: Secondary | ICD-10-CM

## 2010-09-28 DIAGNOSIS — I10 Essential (primary) hypertension: Secondary | ICD-10-CM

## 2010-09-28 DIAGNOSIS — E785 Hyperlipidemia, unspecified: Secondary | ICD-10-CM

## 2010-09-28 DIAGNOSIS — M545 Low back pain, unspecified: Secondary | ICD-10-CM

## 2010-09-28 MED ORDER — MECLIZINE HCL 25 MG PO TABS: 25.0000 mg | ORAL_TABLET | Freq: Three times a day (TID) | ORAL | Status: DC | PRN

## 2010-09-28 MED ORDER — ALPRAZOLAM 0.5 MG PO TABS: 0.5000 mg | ORAL_TABLET | Freq: Every evening | ORAL | Status: DC | PRN

## 2010-09-28 MED ORDER — CITALOPRAM HYDROBROMIDE 40 MG PO TABS: 60.0000 mg | ORAL_TABLET | Freq: Every day | ORAL | Status: DC

## 2010-09-28 MED ORDER — ZOLPIDEM TARTRATE 5 MG PO TABS: 5.0000 mg | ORAL_TABLET | Freq: Every evening | ORAL | Status: DC | PRN

## 2010-09-28 MED ORDER — RANITIDINE HCL 150 MG PO TABS: 150.0000 mg | ORAL_TABLET | Freq: Every day | ORAL | Status: DC

## 2010-09-28 MED ORDER — AMLODIPINE BESYLATE 5 MG PO TABS: 5.0000 mg | ORAL_TABLET | Freq: Every day | ORAL | Status: DC

## 2010-09-28 MED ORDER — SIMVASTATIN 40 MG PO TABS: 40.0000 mg | ORAL_TABLET | Freq: Every day | ORAL | Status: DC

## 2010-09-28 MED ORDER — CLORAZEPATE DIPOTASSIUM 7.5 MG PO TABS: 7.5000 mg | ORAL_TABLET | Freq: Three times a day (TID) | ORAL | Status: DC

## 2010-09-28 MED ORDER — ACETAMINOPHEN-CODEINE 300-30 MG PO TABS: 1.0000 | ORAL_TABLET | Freq: Three times a day (TID) | ORAL | Status: DC | PRN

## 2010-09-28 MED ORDER — QUINAPRIL HCL 20 MG PO TABS: 20.0000 mg | ORAL_TABLET | Freq: Every day | ORAL | Status: DC

## 2010-09-28 NOTE — Assessment & Plan Note (Signed)
We will continue same medication regimen with Tylenol and codeine. Pain is well controlled on current medication. Encouraged exercise daily 20-30 minutes.

## 2010-09-28 NOTE — Assessment & Plan Note (Signed)
Blood pressure is well controlled on current medication regimen. We will provide refills today. I have encouraged patient to check blood pressure regularly and to call us back if the numbers are higher than 140/90. In addition will check electrolyte panel today.

## 2010-09-28 NOTE — Progress Notes (Signed)
  Subjective:    Patient ID: Doris [name], female    DOB: 09-11-1945, 65 y.o.   MRN: [id number]  HPI Patient 65 year old female with past medical history outlined below who presents to clinic for regular followup on blood pressure and preventive medicine. She denies recent sicknesses or hospitalizations, no episodes of chest pain or shortness of breath, no abdominal or urinary concerns, no systemic symptoms, no weight loss or weight gain. She reports compliance with current medications as well as recommended diet and exercise. She also needs refills on her medications.   Review of Systems  Constitutional: Denies fever, chills, diaphoresis, appetite change and fatigue.  HEENT: Denies photophobia, eye pain, redness, hearing loss, ear pain, congestion, sore throat, rhinorrhea, sneezing, mouth sores, trouble swallowing, neck pain, neck stiffness and tinnitus.   Respiratory: Denies SOB, DOE, cough, chest tightness,  and wheezing.   Cardiovascular: Denies chest pain, palpitations and leg swelling.  Gastrointestinal: Denies nausea, vomiting, abdominal pain, diarrhea, constipation, blood in stool and abdominal distention.  Genitourinary: Denies dysuria, urgency, frequency, hematuria, flank pain and difficulty urinating.  Musculoskeletal: Denies myalgias, back pain, joint swelling, arthralgias and gait problem.  Skin: Denies pallor, rash and wound.  Neurological: Denies dizziness, seizures, syncope, weakness, light-headedness, numbness and headaches.  Hematological: Denies adenopathy. Easy bruising, personal or family bleeding history  Psychiatric/Behavioral: Denies suicidal ideation, mood changes, confusion, nervousness, sleep disturbance and agitation        Objective:   Physical Exam  Constitutional: Vital signs reviewed.  Patient is a well-developed and well-nourished in no acute distress and cooperative with exam. Alert and oriented x3.  Cardiovascular: RRR, S1 normal, S2 normal, no MRG,  pulses symmetric and intact bilaterally Pulmonary/Chest: CTAB, no wheezes, rales, or rhonchi Abdominal: Soft. Non-tender, non-distended, bowel sounds are normal, no masses, organomegaly, or guarding present.  Psychiatric: Normal mood and affect. speech and behavior is normal. Judgment and thought content normal. Cognition and memory are normal.          Assessment & Plan:

## 2010-09-28 NOTE — Progress Notes (Deleted)
Addended byMliss Sax on: 09/28/2010 09:20 AM   Modules accepted: Orders

## 2010-09-28 NOTE — Assessment & Plan Note (Signed)
Cholesterol is well controlled on current medication regimen as well as recommended diet. Patient appears compliant with both. Will check fasting lipid panel today and readjust medication regimen is indicated.

## 2010-09-29 ENCOUNTER — Other Ambulatory Visit: Payer: Self-pay | Admitting: Internal Medicine

## 2010-09-29 LAB — COMPREHENSIVE METABOLIC PANEL
CO2: 26 mEq/L (ref 19–32)
Creat: 0.68 mg/dL (ref 0.50–1.10)
Glucose, Bld: 81 mg/dL (ref 70–99)
Total Bilirubin: 0.3 mg/dL (ref 0.3–1.2)

## 2010-09-29 LAB — LIPID PANEL
Cholesterol: 197 mg/dL (ref 0–200)
Total CHOL/HDL Ratio: 3.3 Ratio
Triglycerides: 118 mg/dL (ref <150)
VLDL: 24 mg/dL (ref 0–40)

## 2010-09-29 NOTE — Progress Notes (Signed)
Addended byMliss Sax on: 09/29/2010 01:21 PM   Modules accepted: Orders

## 2010-10-24 ENCOUNTER — Other Ambulatory Visit: Payer: Self-pay | Admitting: Internal Medicine

## 2010-11-17 ENCOUNTER — Encounter: Payer: Self-pay | Admitting: *Deleted

## 2010-11-17 ENCOUNTER — Emergency Department (HOSPITAL_COMMUNITY)
Admission: EM | Admit: 2010-11-17 | Discharge: 2010-11-17 | Disposition: A | Discharge status: Home / Self Care | Payer: Medicare Other | Attending: Emergency Medicine | Admitting: Emergency Medicine

## 2010-11-17 ENCOUNTER — Emergency Department (HOSPITAL_COMMUNITY): Payer: Medicare Other

## 2010-11-17 DIAGNOSIS — M545 Low back pain, unspecified: Secondary | ICD-10-CM | POA: Insufficient documentation

## 2010-11-17 DIAGNOSIS — N200 Calculus of kidney: Secondary | ICD-10-CM | POA: Insufficient documentation

## 2010-11-17 DIAGNOSIS — K8020 Calculus of gallbladder without cholecystitis without obstruction: Secondary | ICD-10-CM | POA: Insufficient documentation

## 2010-11-17 DIAGNOSIS — I10 Essential (primary) hypertension: Secondary | ICD-10-CM | POA: Insufficient documentation

## 2010-11-17 DIAGNOSIS — R109 Unspecified abdominal pain: Secondary | ICD-10-CM | POA: Insufficient documentation

## 2010-11-17 DIAGNOSIS — E785 Hyperlipidemia, unspecified: Secondary | ICD-10-CM | POA: Insufficient documentation

## 2010-11-17 DIAGNOSIS — R110 Nausea: Secondary | ICD-10-CM | POA: Insufficient documentation

## 2010-11-17 LAB — DIFFERENTIAL
Basophils Relative: 0 % (ref 0–1)
Lymphs Abs: 1.7 10*3/uL (ref 0.7–4.0)
Monocytes Relative: 10 % (ref 3–12)
Neutro Abs: 5.1 10*3/uL (ref 1.7–7.7)
Neutrophils Relative %: 65 % (ref 43–77)

## 2010-11-17 LAB — COMPREHENSIVE METABOLIC PANEL WITH GFR
ALT: 10 U/L (ref 0–35)
AST: 14 U/L (ref 0–37)
Albumin: 3.6 g/dL (ref 3.5–5.2)
Alkaline Phosphatase: 64 U/L (ref 39–117)
BUN: 9 mg/dL (ref 6–23)
CO2: 29 meq/L (ref 19–32)
Calcium: 9.4 mg/dL (ref 8.4–10.5)
Chloride: 105 meq/L (ref 96–112)
Creatinine, Ser: 0.58 mg/dL (ref 0.50–1.10)
GFR calc Af Amer: >90 mL/min
GFR calc non Af Amer: >90 mL/min
Glucose, Bld: 96 mg/dL (ref 70–99)
Potassium: 3.6 meq/L (ref 3.5–5.1)
Sodium: 141 meq/L (ref 135–145)
Total Bilirubin: 0.4 mg/dL (ref 0.3–1.2)
Total Protein: 6.7 g/dL (ref 6.0–8.3)

## 2010-11-17 LAB — URINALYSIS, ROUTINE W REFLEX MICROSCOPIC
Glucose, UA: NEGATIVE mg/dL
Hgb urine dipstick: NEGATIVE
Specific Gravity, Urine: 1.01 (ref 1.005–1.030)

## 2010-11-17 LAB — CBC
Hemoglobin: 13.4 g/dL (ref 12.0–15.0)
MCH: 28.9 pg (ref 26.0–34.0)
RBC: 4.64 MIL/uL (ref 3.87–5.11)

## 2010-11-17 LAB — LIPASE, BLOOD: Lipase: 25 U/L (ref 11–59)

## 2010-11-17 NOTE — Progress Notes (Unsigned)
Pt presents to front desk as walk-in, c/o low back pain and low abd pain since Saturday, denies all injuries. Admits to having pain in past but no where as bad as now. Has taken TY#3 w/ no relief also motrin w/ no relief. Last ty#3 10/1 pm, motrin Saturday. On scale 0-10, pt states "it's past 10, it's very crippling". She wishes to be seen. Spoke w/ dr Phillips Odor, will take to ED Taken via wh/ch to ED, report and snapshot given to triage in ED

## 2010-11-17 NOTE — Progress Notes (Signed)
Needs stat ED w/u. Agree with plan.

## 2010-11-24 ENCOUNTER — Ambulatory Visit (INDEPENDENT_AMBULATORY_CARE_PROVIDER_SITE_OTHER): Payer: Medicare Other | Admitting: Internal Medicine

## 2010-11-24 ENCOUNTER — Encounter: Payer: Self-pay | Admitting: Internal Medicine

## 2010-11-24 VITALS — BP 121/75 | HR 92 | Temp 98.7°F | Ht 65.0 in | Wt 152.3 lb

## 2010-11-24 DIAGNOSIS — M545 Low back pain, unspecified: Secondary | ICD-10-CM

## 2010-11-24 DIAGNOSIS — N200 Calculus of kidney: Secondary | ICD-10-CM

## 2010-11-24 DIAGNOSIS — I10 Essential (primary) hypertension: Secondary | ICD-10-CM

## 2010-11-24 DIAGNOSIS — E041 Nontoxic single thyroid nodule: Secondary | ICD-10-CM

## 2010-11-24 LAB — TSH: TSH: 0.106 u[IU]/mL — ABNORMAL LOW (ref 0.350–4.500)

## 2010-11-24 LAB — T4, FREE: Free T4: 0.75 ng/dL — ABNORMAL LOW (ref 0.80–1.80)

## 2010-11-24 MED ORDER — GABAPENTIN 300 MG PO CAPS: 300.0000 mg | ORAL_CAPSULE | Freq: Three times a day (TID) | ORAL | Status: DC

## 2010-11-24 MED ORDER — QUINAPRIL HCL 10 MG PO TABS: 10.0000 mg | ORAL_TABLET | Freq: Every day | ORAL | Status: DC

## 2010-11-24 NOTE — Progress Notes (Deleted)
  Subjective:    Patient ID: Doris [name], female    DOB: [birth year], 65 y.o.   MRN: [id number]  HPI    Review of Systems     Objective:   Physical Exam        Assessment & Plan:

## 2010-11-24 NOTE — Patient Instructions (Signed)
Take Gabapentin 300 mg once a day for one week. Then take 300 mg twice a day for one week . Then take 300 mg three times a day.

## 2010-11-24 NOTE — Assessment & Plan Note (Signed)
Patient thought that her hair loss is related to Accupril and she has been taking 10 mg instead of 20 mg. Her blood pressure is well controlled on current regimen. I will therefore continue he ron 10 mg Accupril.

## 2010-11-24 NOTE — Assessment & Plan Note (Signed)
Will recheck TSH and free T4. Thyroid nodule found on ultrasound in 2010 was noted as non-neoplastic goiter. No follow up imaging is needed at this point except if it is increasing in size and patient is symptomatic.

## 2010-11-24 NOTE — Assessment & Plan Note (Signed)
Patient noted that she would like to discontinue Tylenol/codeine but would like to have something stronger like oxycodone for her pain all over the body. I am reluctant to start her any narcotics especially when patient noted that ibuprofen was working sometimes better. I will start her on gabapentin. D/c narcotics. And will refer to pain clinic for further evaluation and management.

## 2010-11-24 NOTE — Progress Notes (Signed)
Subjective:   Patient ID: Doris [name] female   DOB: 07-27-1945 66 y.o.   MRN: [id number]  HPI: Doris [name] [name] is a 65 y.o. female with PMH significant as outlined below who presented to the clinic for a follow up from the ED after being evaluated for acute back  Pain associated with nausea. She was found to have on CT abdomen  Mild degenerative changes in the lower lumbar spine. Low-density lesions within the liver and left kidney most likely represents cysts although there cannot be fully characterize without IV contrast. Left nephrolithiasis. Cholelithiasis. She was discharged with pain (oxycodone) and nausea medication . She noted that her pain has improved significantly but she still has some aching pain in the left flank area. She further noted she would like to discontinue the tylenol/codeine since she thinks th tylenol is not good for liver. She further noted that she would like to have refills for oxycodone since that is helping with her pain in her back, knee, feet. She has been using ibuprofen which actually working well for the pain.    Past Medical History  Diagnosis Date  . Hypertension   . Insomnia   . Vertigo   . Migraine   . Thyroid nodule 2010    TSH 04/2008 = 0.296, no follow up noted in [facility]  . HLD (hyperlipidemia)   . Psychosis     followed by Dr. Harrison Mons [phone]   Current Outpatient Prescriptions  Medication Sig Dispense Refill  . Acetaminophen-Codeine (TYLENOL/CODEINE #3) 300-30 MG per tablet Take 1 tablet by mouth 3 (three) times daily as needed. For pain  90 tablet  5  . amLODipine (NORVASC) 5 MG tablet TAKE ONE TABLET BY MOUTH ONE TIME DAILY  30 tablet  4  . citalopram (CELEXA) 40 MG tablet Take 1.5 tablets (60 mg total) by mouth daily.  45 tablet  11  . ibuprofen (ADVIL,MOTRIN) 200 MG tablet Take 200 mg by mouth every 6 (six) hours as needed.        . orphenadrine (NORFLEX) 100 MG tablet Given by ER      . oxyCODONE (OXY IR/ROXICODONE) 5 MG  immediate release tablet Given in ER      . quinapril (ACCUPRIL) 20 MG tablet Take 1 tablet (20 mg total) by mouth daily.  30 tablet  11  . ranitidine (ZANTAC) 150 MG tablet Take 1 tablet (150 mg total) by mouth daily.  30 tablet  11  . simvastatin (ZOCOR) 40 MG tablet Take 1 tablet (40 mg total) by mouth at bedtime.  30 tablet  11  . zolpidem (AMBIEN) 5 MG tablet Take 1 tablet (5 mg total) by mouth at bedtime as needed.  30 tablet  5  . ALPRAZolam (XANAX) 0.5 MG tablet Take 1 tablet (0.5 mg total) by mouth at bedtime as needed.  30 tablet  5  . hydrOXYzine (ATARAX/VISTARIL) 50 MG tablet       Review of Systems: Constitutional: Denies fever, chills, diaphoresis, appetite change and fatigue.  HEENT: Denies photophobia, eye pain, redness, hearing loss, ear pain, eck pain, neck stiffness and tinnitus.   Respiratory: Denies SOB, DOE, cough, chest tightness,  and wheezing.   Cardiovascular: Denies chest pain, palpitations and leg swelling.  Gastrointestinal: Denies nausea, vomiting, abdominal pain, diarrhea, constipation, blood in stool and abdominal distention.  Genitourinary: Denies dysuria, urgency, frequency, hematuria, flank pain and difficulty urinating. .  Skin: Denies pallor, rash and wound.  Neurological: Denies dizziness, seizures, syncope, weakness, light-headedness, numbness and  headaches.    Objective:  Physical Exam: Filed Vitals:   11/24/10 1554  BP: 121/75  Pulse: 92  Temp: 98.7 F (37.1 C)  TempSrc: Oral  Height: 5\' 5"  (1.651 m)  Weight: 152 lb 4.8 oz (69.083 kg)   Constitutional: Vital signs reviewed.  Patient is a well-developed and well-nourished  in no acute distress and cooperative with exam. Alert and oriented x3.   Neck: Supple, Trachea midline normal ROM, No JVD, mass, thyromegaly, or carotid bruit present.  Cardiovascular: RRR, S1 normal, S2 normal, no MRG, pulses symmetric and intact bilaterally Pulmonary/Chest: CTAB, no wheezes, rales, or rhonchi Abdominal:  Soft. Non-tender, non-distended, bowel sounds are normal, no masses, organomegaly, or guarding present.  GU: no CVA tenderness Musculoskeletal: No joint deformities, erythema, or stiffness, ROM full and no nontender Neurological: A&O x3, , no focal motor deficit, sensory intact to light touch bilaterally.  Skin: Warm, dry and intact. No rash, cyanosis, or clubbing.

## 2010-11-24 NOTE — Assessment & Plan Note (Signed)
Likely the acute pain which patient presented with on 10/2 was due to Nephrolithiasis. Now improved. Patient not sure if she passed the stone. Will continue to monitor. First event.

## 2010-11-25 LAB — BASIC METABOLIC PANEL
BUN: 14 mg/dL (ref 6–23)
CO2: 25 mEq/L (ref 19–32)
Calcium: 9.5 mg/dL (ref 8.4–10.5)
Creat: 0.71 mg/dL (ref 0.50–1.10)
Glucose, Bld: 93 mg/dL (ref 70–99)
Sodium: 140 mEq/L (ref 135–145)

## 2010-11-30 ENCOUNTER — Telehealth: Payer: Self-pay | Admitting: Internal Medicine

## 2010-11-30 DIAGNOSIS — E041 Nontoxic single thyroid nodule: Secondary | ICD-10-CM

## 2010-11-30 NOTE — Telephone Encounter (Signed)
Informed patient about Thyroid function test. Will refer patient to Endocrinology for further evaluation and management.

## 2010-12-22 ENCOUNTER — Telehealth: Payer: Self-pay | Admitting: *Deleted

## 2010-12-22 NOTE — Telephone Encounter (Signed)
Pt calls and leaves message she wants a mammogram and bone density test, i noted in chart it is time for a f/u  From 10/9 appt, appt is made for 11/12 at 0915 w/ dr sidhu per chilonb. Pt is agreeable

## 2010-12-28 ENCOUNTER — Encounter: Payer: Self-pay | Admitting: Internal Medicine

## 2010-12-28 ENCOUNTER — Ambulatory Visit (INDEPENDENT_AMBULATORY_CARE_PROVIDER_SITE_OTHER): Payer: Medicare Other | Admitting: Internal Medicine

## 2010-12-28 VITALS — BP 141/89 | HR 84 | Temp 97.6°F | Ht 65.0 in | Wt 150.0 lb

## 2010-12-28 DIAGNOSIS — I10 Essential (primary) hypertension: Secondary | ICD-10-CM

## 2010-12-28 DIAGNOSIS — Z0000 Encounter for general adult medical examination without abnormal findings: Secondary | ICD-10-CM

## 2010-12-28 NOTE — Assessment & Plan Note (Signed)
Mammogram and Bone density scan have been scheduled.

## 2010-12-28 NOTE — Assessment & Plan Note (Signed)
Lab Results  Component Value Date   NA 140 11/24/2010   K 4.1 11/24/2010   CL 104 11/24/2010   CO2 25 11/24/2010   BUN 14 11/24/2010   CREATININE 0.71 11/24/2010   CREATININE 0.58 11/17/2010    BP Readings from Last 3 Encounters:  12/28/10 141/89  11/24/10 121/75  09/28/10 122/77    Assessment: Hypertension control:  controlled  Progress toward goals:  at goal Barriers to meeting goals:  no barriers identified  Plan: Hypertension treatment:  continue current medications

## 2010-12-28 NOTE — Progress Notes (Signed)
  Subjective:    Patient ID: Doris [name], female    DOB: Dec 14, 1945, 65 y.o.   MRN: [id number]  HPI [name] is a 65 year old woman with past medical history significant for hyperlipidemia, hypertension and lower back pain that presents today for request of mammogram and bone density scan. Patient does not have any complaints or concerns today. Patient states that she is feeling well and is compliant with her medication therapy.    Review of Systems  All other systems reviewed and are negative.       Objective:   Physical Exam  Constitutional: She appears well-developed.  Cardiovascular: Normal rate.   Pulmonary/Chest: Effort normal.          Assessment & Plan:

## 2010-12-31 NOTE — Progress Notes (Signed)
Addended by: Dorie Rank E on: 12/31/2010 04:01 PM   Modules accepted: Orders

## 2011-02-19 DIAGNOSIS — R946 Abnormal results of thyroid function studies: Secondary | ICD-10-CM | POA: Diagnosis not present

## 2011-02-19 DIAGNOSIS — E042 Nontoxic multinodular goiter: Secondary | ICD-10-CM | POA: Diagnosis not present

## 2011-03-04 ENCOUNTER — Encounter: Payer: Self-pay | Admitting: *Deleted

## 2011-03-22 ENCOUNTER — Other Ambulatory Visit: Payer: Self-pay | Admitting: Internal Medicine

## 2011-03-22 DIAGNOSIS — E042 Nontoxic multinodular goiter: Secondary | ICD-10-CM | POA: Insufficient documentation

## 2011-03-22 DIAGNOSIS — E039 Hypothyroidism, unspecified: Secondary | ICD-10-CM | POA: Insufficient documentation

## 2011-03-22 DIAGNOSIS — E0590 Thyrotoxicosis, unspecified without thyrotoxic crisis or storm: Secondary | ICD-10-CM

## 2011-03-26 ENCOUNTER — Telehealth: Payer: Self-pay | Admitting: *Deleted

## 2011-03-26 NOTE — Telephone Encounter (Signed)
Pt dropped off letter today from her insurance co([employer]) stating they will not longer cover Celexa (Citalopram) 40mg . I called the co; stated  Prior authorization  Needs to be done or change in medication. Thanks

## 2011-03-29 ENCOUNTER — Other Ambulatory Visit: Payer: Self-pay | Admitting: *Deleted

## 2011-03-29 NOTE — Telephone Encounter (Signed)
Hi Glenda- As discussed, just let me know how we can get preapproval for the celexa  Thanks, ben

## 2011-03-29 NOTE — Telephone Encounter (Signed)
Last dispensed 2/8

## 2011-03-29 NOTE — Telephone Encounter (Signed)
[facility] states pt's insurance will cover 1 tab(40mg ) not 1.5tab. But a request for an exception can be called to [phone] by pt's MD.

## 2011-03-30 MED ORDER — ALPRAZOLAM 0.5 MG PO TABS: 0.5000 mg | ORAL_TABLET | Freq: Every evening | ORAL | Status: DC | PRN

## 2011-03-30 NOTE — Telephone Encounter (Signed)
Hi Doris [name]-  I tried to refill this but I'm not sure it worked.  What are you seeing from your end?  Thanks, [employer]

## 2011-04-01 ENCOUNTER — Encounter: Payer: Self-pay | Admitting: Internal Medicine

## 2011-04-01 DIAGNOSIS — F323 Major depressive disorder, single episode, severe with psychotic features: Secondary | ICD-10-CM | POA: Insufficient documentation

## 2011-04-01 NOTE — Telephone Encounter (Signed)
Spoke with insurance co for preapproval.  My request has to go through their clinical dept.  Reference # is [id number], and we are expecting a fax to [phone] with their response in 24 hours.  Please let me know when it comes.

## 2011-04-05 NOTE — Telephone Encounter (Signed)
This med has been called into pharmacy. thanks

## 2011-04-08 ENCOUNTER — Encounter (HOSPITAL_COMMUNITY)
Admission: RE | Admit: 2011-04-08 | Discharge: 2011-04-08 | Disposition: A | Discharge status: Home / Self Care | Payer: Medicare Other | Source: Ambulatory Visit | Attending: Internal Medicine | Admitting: Internal Medicine

## 2011-04-08 DIAGNOSIS — E0590 Thyrotoxicosis, unspecified without thyrotoxic crisis or storm: Secondary | ICD-10-CM

## 2011-04-08 DIAGNOSIS — E042 Nontoxic multinodular goiter: Secondary | ICD-10-CM

## 2011-04-08 MED ORDER — SODIUM IODIDE I 131 CAPSULE
9.8000 | Freq: Once | INTRAVENOUS | Status: AC | PRN
  Administered 2011-04-08: 9.8 via ORAL

## 2011-04-08 NOTE — Telephone Encounter (Signed)
Received PA approval for pt's Citalopram 40 mg which will remain in effect as long as patient is on this insurance policy.Kingsley Spittle Cassady2/21/20139:33 AM

## 2011-04-09 ENCOUNTER — Ambulatory Visit (HOSPITAL_COMMUNITY)
Admission: RE | Admit: 2011-04-09 | Discharge: 2011-04-09 | Disposition: A | Discharge status: Home / Self Care | Payer: Medicare Other | Source: Ambulatory Visit | Attending: Internal Medicine | Admitting: Internal Medicine

## 2011-04-09 DIAGNOSIS — E0590 Thyrotoxicosis, unspecified without thyrotoxic crisis or storm: Secondary | ICD-10-CM | POA: Insufficient documentation

## 2011-04-09 MED ORDER — SODIUM IODIDE I 131 CAPSULE
9.8000 | Freq: Once | INTRAVENOUS | Status: AC | PRN
  Administered 2011-04-08: 9.8 via ORAL

## 2011-04-09 MED ORDER — SODIUM PERTECHNETATE TC 99M INJECTION
10.0000 | Freq: Once | INTRAVENOUS | Status: AC | PRN
  Administered 2011-04-09: 10 via INTRAVENOUS

## 2011-04-13 ENCOUNTER — Telehealth: Payer: Self-pay | Admitting: *Deleted

## 2011-04-13 NOTE — Telephone Encounter (Signed)
Pt called in with c/o SOB for past 4 days.   This is new for her, she "can't catch her breath with exertion".   Denies any swelling in ankles, she did say she had episode of chest pain last night with radiation to left arm.  She has Hx of angina and used nitro.but she has lost weight and no longer uses nitro.   She had thyroid uptake test last Thursday and SOB after that. She got SOB just with our conversation. Pt wants to be seen tomorrow.  Pt # [id number] She does NOT want to go to ED

## 2011-04-13 NOTE — Telephone Encounter (Signed)
Pt called back and I told her Dr Aundria Rud Strongly advises ED now, he does not want her to wait until tomorrow for clinic appointment. Pt states she will probably go to ED I

## 2011-04-13 NOTE — Telephone Encounter (Signed)
This patient should be strongly advised to go to the ER now, probably by 911.  ONLY if she refuses should she be encouraged to come here in the morning.

## 2011-04-16 DIAGNOSIS — E042 Nontoxic multinodular goiter: Secondary | ICD-10-CM | POA: Diagnosis not present

## 2011-04-22 ENCOUNTER — Other Ambulatory Visit: Payer: Self-pay | Admitting: *Deleted

## 2011-04-23 MED ORDER — ALPRAZOLAM 0.5 MG PO TABS: 0.5000 mg | ORAL_TABLET | Freq: Every evening | ORAL | Status: DC | PRN

## 2011-04-27 ENCOUNTER — Other Ambulatory Visit: Payer: Self-pay | Admitting: *Deleted

## 2011-04-27 MED ORDER — ZOLPIDEM TARTRATE 5 MG PO TABS: 5.0000 mg | ORAL_TABLET | Freq: Every evening | ORAL | Status: DC | PRN

## 2011-04-27 NOTE — Telephone Encounter (Signed)
Phoned pharmacy

## 2011-04-30 NOTE — Telephone Encounter (Signed)
Called into pharmacy

## 2011-05-11 ENCOUNTER — Other Ambulatory Visit: Payer: Self-pay | Admitting: *Deleted

## 2011-05-11 MED ORDER — SIMVASTATIN 20 MG PO TABS: 20.0000 mg | ORAL_TABLET | Freq: Every day | ORAL | Status: DC

## 2011-05-11 NOTE — Telephone Encounter (Signed)
Pt aware of dose change on simvastatin from 40mg  to 20mg  and will call and sch appt per Dr Meredith Pel.

## 2011-05-11 NOTE — Telephone Encounter (Signed)
Note on refill - pt receiving simvastatin 40mg  daily and amlodipine 5 mg daily - per update FDA guidelines for pt receiving amlodipine - simvastatin dose should not exceed 20mg /day due to risk of muscle toxicity. Would you like to change the dose on Rx?

## 2011-05-11 NOTE — Telephone Encounter (Signed)
I changed simvastatin dose to 20 mg daily.  Please inform patient; also, please schedule a follow up appointment with her PCP.

## 2011-05-14 ENCOUNTER — Encounter (HOSPITAL_COMMUNITY): Payer: Self-pay | Admitting: *Deleted

## 2011-05-14 ENCOUNTER — Emergency Department (INDEPENDENT_AMBULATORY_CARE_PROVIDER_SITE_OTHER)
Admission: EM | Admit: 2011-05-14 | Discharge: 2011-05-14 | Disposition: A | Discharge status: Home / Self Care | Payer: Medicare Other | Source: Home / Self Care | Attending: Family Medicine | Admitting: Family Medicine

## 2011-05-14 DIAGNOSIS — M5430 Sciatica, unspecified side: Secondary | ICD-10-CM | POA: Diagnosis not present

## 2011-05-14 MED ORDER — KETOROLAC TROMETHAMINE 30 MG/ML IJ SOLN
30.0000 mg | Freq: Once | INTRAMUSCULAR | Status: AC
  Administered 2011-05-14: 30 mg via INTRAMUSCULAR

## 2011-05-14 MED ORDER — KETOROLAC TROMETHAMINE 30 MG/ML IJ SOLN
INTRAMUSCULAR | Status: AC
  Filled 2011-05-14: qty 1

## 2011-05-14 MED ORDER — METHYLPREDNISOLONE 4 MG PO KIT: PACK | ORAL | Status: AC

## 2011-05-14 NOTE — ED Notes (Signed)
Pt  Reports pain/  Numbness   r  Leg         For  sev  Days  She  denys  Any  specefic  Injury      She  Is  Sitting  Upright on  The  Exam  Table  In no  Severe  Distress   Speaking  In  Complete  sentances

## 2011-05-14 NOTE — ED Provider Notes (Signed)
History     CSN: [id number]  Arrival date & time 05/14/11  1024   First MD Initiated Contact with Patient 05/14/11 1306      Chief Complaint  Patient presents with  . Leg Pain    (Consider location/radiation/quality/duration/timing/severity/associated sxs/prior treatment) Patient is a 66 y.o. female presenting with leg pain. The history is provided by the patient.  Leg Pain  The incident occurred more than 2 days ago (known bulging disc in back, has had shooting pains in leg but noticed numbness for past 2 days.). The incident occurred at home. There was no injury mechanism. The pain is present in the right foot. The quality of the pain is described as sharp. The pain is mild. Associated symptoms include numbness and tingling. Pertinent negatives include no inability to bear weight and no muscle weakness.    Past Medical History  Diagnosis Date  . Hypertension   . Insomnia   . Vertigo   . Migraine   . Thyroid nodule 2010    TSH 04/2008 = 0.296, no follow up noted in [facility]  . HLD (hyperlipidemia)   . Psychosis     followed by Dr. Harrison Mons [phone]    History reviewed. No pertinent past surgical history.  Family History  Problem Relation Age of Onset  . Stroke Neg Hx   . Heart disease Neg Hx   . Cancer Neg Hx     History  Substance Use Topics  . Smoking status: Never Smoker   . Smokeless tobacco: Not on file  . Alcohol Use: No    OB History    Grav Para Term Preterm Abortions TAB SAB Ect Mult Living                  Review of Systems  Genitourinary: Negative.   Musculoskeletal: Positive for back pain. Negative for joint swelling and gait problem.  Neurological: Positive for tingling and numbness. Negative for weakness.    Allergies  Lidocaine and Olanzapine  Home Medications   Current Outpatient Rx  Name Route Sig Dispense Refill  . ALPRAZOLAM 0.5 MG PO TABS Oral Take 1 tablet (0.5 mg total) by mouth at bedtime as needed. 30 tablet 1    Please  do not fill until 04/27/11  . AMLODIPINE BESYLATE 5 MG PO TABS  TAKE ONE TABLET BY MOUTH ONE TIME DAILY 30 tablet 4  . CITALOPRAM HYDROBROMIDE 40 MG PO TABS Oral Take 1.5 tablets (60 mg total) by mouth daily. 45 tablet 11  . GABAPENTIN 300 MG PO CAPS Oral Take 1 capsule (300 mg total) by mouth 3 (three) times daily. 90 capsule 3  . HYDROXYZINE HCL 50 MG PO TABS      . IBUPROFEN 200 MG PO TABS Oral Take 200 mg by mouth every 6 (six) hours as needed.      . METHYLPREDNISOLONE 4 MG PO KIT  follow package directions until finished, start on sat.3/30. 21 tablet 0  . ORPHENADRINE CITRATE ER 100 MG PO TB12  Given by ER    . QUINAPRIL HCL 10 MG PO TABS Oral Take 1 tablet (10 mg total) by mouth daily. 30 tablet 11  . RANITIDINE HCL 150 MG PO TABS Oral Take 1 tablet (150 mg total) by mouth daily. 30 tablet 11  . SIMVASTATIN 20 MG PO TABS Oral Take 1 tablet (20 mg total) by mouth at bedtime. 30 tablet 1  . ZOLPIDEM TARTRATE 5 MG PO TABS Oral Take 1 tablet (5 mg total) by  mouth at bedtime as needed. 30 tablet 1    BP 171/98  Pulse 76  Temp(Src) 98.3 F (36.8 C) (Oral)  Resp 14  SpO2 100%  LMP 03/27/1984  Physical Exam  Nursing note and vitals reviewed. Constitutional: She is oriented to person, place, and time. She appears well-developed and well-nourished.  Abdominal: Soft. Bowel sounds are normal. There is no tenderness.  Musculoskeletal: Normal range of motion. She exhibits no tenderness.       Back:  Neurological: She is alert and oriented to person, place, and time.  Skin: Skin is warm and dry.  Psychiatric: She has a normal mood and affect.    ED Course  Procedures (including critical care time)  Labs Reviewed - No data to display No results found.   1. Sciatica neuralgia       MDM          Linna Hoff, MD 05/17/11 1530

## 2011-05-14 NOTE — Discharge Instructions (Signed)
Take medicine as prescribed and see your doctor if further problems

## 2011-05-25 ENCOUNTER — Encounter: Payer: Self-pay | Admitting: Internal Medicine

## 2011-05-25 ENCOUNTER — Ambulatory Visit (INDEPENDENT_AMBULATORY_CARE_PROVIDER_SITE_OTHER): Payer: Medicare Other | Admitting: Internal Medicine

## 2011-05-25 VITALS — BP 121/78 | HR 91 | Temp 99.2°F | Ht 65.0 in | Wt 154.6 lb

## 2011-05-25 DIAGNOSIS — I10 Essential (primary) hypertension: Secondary | ICD-10-CM | POA: Diagnosis not present

## 2011-05-25 DIAGNOSIS — F323 Major depressive disorder, single episode, severe with psychotic features: Secondary | ICD-10-CM | POA: Diagnosis not present

## 2011-05-25 DIAGNOSIS — K219 Gastro-esophageal reflux disease without esophagitis: Secondary | ICD-10-CM | POA: Diagnosis not present

## 2011-05-25 DIAGNOSIS — R52 Pain, unspecified: Secondary | ICD-10-CM | POA: Diagnosis not present

## 2011-05-25 DIAGNOSIS — M545 Low back pain, unspecified: Secondary | ICD-10-CM | POA: Diagnosis not present

## 2011-05-25 MED ORDER — QUINAPRIL HCL 10 MG PO TABS: 5.0000 mg | ORAL_TABLET | Freq: Every day | ORAL | Status: DC

## 2011-05-25 MED ORDER — QUINAPRIL HCL 5 MG PO TABS: 5.0000 mg | ORAL_TABLET | Freq: Every day | ORAL | Status: DC

## 2011-05-25 MED ORDER — IBUPROFEN 800 MG PO TABS: 800.0000 mg | ORAL_TABLET | Freq: Three times a day (TID) | ORAL | Status: DC | PRN

## 2011-05-25 MED ORDER — AMITRIPTYLINE HCL 25 MG PO TABS: 25.0000 mg | ORAL_TABLET | Freq: Every day | ORAL | Status: DC

## 2011-05-25 MED ORDER — OMEPRAZOLE 20 MG PO CPDR: 20.0000 mg | DELAYED_RELEASE_CAPSULE | Freq: Every day | ORAL | Status: DC

## 2011-05-25 NOTE — Assessment & Plan Note (Signed)
Pt currently on xanax prn, celexa 60mg  daily for depression and anxiety.  Clearly anxious on exam, but overall well appearing.  Consoled by my reassurance.  Her total body pain and lumbago is likely related to psychiatric disease rather than physiologic process.  I don't think she has true fibromyalgia, but she has muscular tenderness bilaterally.  Also not sleeping well, ambien occasionally causing sleepwalking.  After d/w Dr. Josem Kaufmann, do not think rheum/inflammatory w/u is indicated. - amitriptyline 25mg  qhs - con't celexa - d/c ambien - see low back pain for further details

## 2011-05-25 NOTE — Assessment & Plan Note (Signed)
BP Readings from Last 3 Encounters:  05/25/11 121/78  05/14/11 171/98  12/28/10 141/89   BP well controlled today despite pain/anxiety.  C/o orthostatic symptoms, but wary to change meds given concern over heart. - lower quinapril to 5mg  - con't norvasc

## 2011-05-25 NOTE — Assessment & Plan Note (Signed)
Seen in ED for sciatica, but negative SLR test and good ROM on exam.  Says steroids did not help very much.  Likely a combination of organic lumbago and psychiatric disease.  Pt is hesitant to use NSAIDS, and we discussed that higher doses of NSAIDS in limited doses could be helpful for her.  She is amenable. - ibuprofen 800mg  TID PRN - switch H2 blocker to PPI - amitriptyline as outlined in depression section

## 2011-05-25 NOTE — Progress Notes (Signed)
Subjective:     Patient ID: Doris [name], female   DOB: 06-06-1945, 66 y.o.   MRN: [id number]  HPI Pt is a 66 yo F with a h/o depression with psychotic features, lumbago, and subclinical hyperthyroidism who presents for f/u.  Pt was recently seen in the ED for sciatica, given a steroid injection and taper.  Pt now c/o con't shooting pains down the back of the R leg as well as total body pain.  She has pain in her b/l biceps, b/l calves, back, and R leg.  She is follwed by Dr. Sharl Ma for subclinical hyperthyroidism, and recently had a thyroid scan that was normal.  Also complaining of orthostatic symptoms when she sits up or stands too fast.  Review of Systems Palpitations, difficulty breathing following thyroid scan    Objective:   Physical Exam Gen: very pleasant, mildly anxious appearing MSK: TTP over b/l bicep, b/l calves, back.  Good ROM and strength of RLE, negative SLR test.    Assessment:         Plan:

## 2011-06-21 DIAGNOSIS — E042 Nontoxic multinodular goiter: Secondary | ICD-10-CM | POA: Diagnosis not present

## 2011-06-24 DIAGNOSIS — E042 Nontoxic multinodular goiter: Secondary | ICD-10-CM | POA: Diagnosis not present

## 2011-06-24 DIAGNOSIS — E0590 Thyrotoxicosis, unspecified without thyrotoxic crisis or storm: Secondary | ICD-10-CM | POA: Diagnosis not present

## 2011-07-21 ENCOUNTER — Other Ambulatory Visit: Payer: Self-pay | Admitting: *Deleted

## 2011-07-21 MED ORDER — ALPRAZOLAM 0.5 MG PO TABS: 0.5000 mg | ORAL_TABLET | Freq: Every evening | ORAL | Status: DC | PRN

## 2011-07-22 NOTE — Telephone Encounter (Signed)
Rx called in to pharmacy. 

## 2011-08-02 ENCOUNTER — Ambulatory Visit (INDEPENDENT_AMBULATORY_CARE_PROVIDER_SITE_OTHER): Payer: Medicare Other | Admitting: Internal Medicine

## 2011-08-02 ENCOUNTER — Encounter: Payer: Self-pay | Admitting: Internal Medicine

## 2011-08-02 VITALS — BP 128/82 | HR 76 | Temp 98.9°F | Ht 65.0 in | Wt 153.1 lb

## 2011-08-02 DIAGNOSIS — M545 Low back pain, unspecified: Secondary | ICD-10-CM | POA: Diagnosis not present

## 2011-08-02 DIAGNOSIS — M5430 Sciatica, unspecified side: Secondary | ICD-10-CM

## 2011-08-02 MED ORDER — OXYCODONE HCL 5 MG PO TABS: 5.0000 mg | ORAL_TABLET | Freq: Three times a day (TID) | ORAL | Status: DC | PRN

## 2011-08-02 MED ORDER — PREGABALIN 75 MG PO CAPS: 75.0000 mg | ORAL_CAPSULE | Freq: Two times a day (BID) | ORAL | Status: DC

## 2011-08-02 NOTE — Assessment & Plan Note (Signed)
Persistent symptoms consistent with sciatica. Had tried Elavil with no relief.  Has tried gabapentin with poor response in the past. - Lyrica 75 mg twice a day, can titrate up - Patient wanted multiple prescriptions for oxycodone. I explained this was not a good solution to her pain. I did prescribe her 15 pills, as she says that she wants to use them on occasion when her pain is very bad. In my opinion, this patient should not be on daily narcotics. Please try to resist this in the future. - Physical therapy referral - RTC 6 weeks to reevaluate - If continued pain, consider MRI and pain clinic referral, possibly sports medicine (provider preference)

## 2011-08-02 NOTE — Progress Notes (Signed)
Subjective:     Patient ID: Doris [name], female   DOB: [birth year], 66 y.o.   MRN: [id number]  HPI Patient is a 66 year old woman with history of depression, subclinical hyperthyroidism, and sciatica who presents for followup.  Patient presents with persistent right-sided sciatica, with sharp pain in the buttock and electric radiation down all the way to the toes. She had tried Elavil without relief. The pain is constant, worse with lying down. The pain is interfering with her daily life.  Review of Systems Stable mood    Objective:   Physical Exam Gen: NAD, cooperative MSK: good rom, neg SLR, mild TTP in R lumbar spine    Assessment:         Plan:

## 2011-08-30 ENCOUNTER — Ambulatory Visit: Payer: Medicare Other | Attending: Internal Medicine

## 2011-08-30 DIAGNOSIS — M2569 Stiffness of other specified joint, not elsewhere classified: Secondary | ICD-10-CM | POA: Insufficient documentation

## 2011-08-30 DIAGNOSIS — R293 Abnormal posture: Secondary | ICD-10-CM | POA: Diagnosis not present

## 2011-08-30 DIAGNOSIS — R5381 Other malaise: Secondary | ICD-10-CM | POA: Insufficient documentation

## 2011-08-30 DIAGNOSIS — IMO0001 Reserved for inherently not codable concepts without codable children: Secondary | ICD-10-CM | POA: Diagnosis not present

## 2011-08-30 DIAGNOSIS — M6281 Muscle weakness (generalized): Secondary | ICD-10-CM | POA: Diagnosis not present

## 2011-08-30 DIAGNOSIS — M2550 Pain in unspecified joint: Secondary | ICD-10-CM | POA: Diagnosis not present

## 2011-09-07 DIAGNOSIS — E042 Nontoxic multinodular goiter: Secondary | ICD-10-CM | POA: Diagnosis not present

## 2011-09-09 DIAGNOSIS — E0590 Thyrotoxicosis, unspecified without thyrotoxic crisis or storm: Secondary | ICD-10-CM | POA: Diagnosis not present

## 2011-09-09 DIAGNOSIS — E042 Nontoxic multinodular goiter: Secondary | ICD-10-CM | POA: Diagnosis not present

## 2011-09-11 ENCOUNTER — Emergency Department (INDEPENDENT_AMBULATORY_CARE_PROVIDER_SITE_OTHER)
Admission: EM | Admit: 2011-09-11 | Discharge: 2011-09-11 | Disposition: A | Discharge status: Home / Self Care | Payer: Medicare Other | Source: Home / Self Care | Attending: Emergency Medicine | Admitting: Emergency Medicine

## 2011-09-11 ENCOUNTER — Emergency Department (INDEPENDENT_AMBULATORY_CARE_PROVIDER_SITE_OTHER): Payer: Medicare Other

## 2011-09-11 ENCOUNTER — Encounter (HOSPITAL_COMMUNITY): Payer: Self-pay | Admitting: *Deleted

## 2011-09-11 DIAGNOSIS — S42209A Unspecified fracture of upper end of unspecified humerus, initial encounter for closed fracture: Secondary | ICD-10-CM | POA: Diagnosis not present

## 2011-09-11 DIAGNOSIS — S42213A Unspecified displaced fracture of surgical neck of unspecified humerus, initial encounter for closed fracture: Secondary | ICD-10-CM

## 2011-09-11 DIAGNOSIS — M79609 Pain in unspecified limb: Secondary | ICD-10-CM | POA: Diagnosis not present

## 2011-09-11 DIAGNOSIS — M25529 Pain in unspecified elbow: Secondary | ICD-10-CM | POA: Diagnosis not present

## 2011-09-11 HISTORY — DX: Dorsalgia, unspecified: M54.9

## 2011-09-11 HISTORY — DX: Gastro-esophageal reflux disease without esophagitis: K21.9

## 2011-09-11 MED ORDER — OXYCODONE-ACETAMINOPHEN 5-325 MG PO TABS: 1.0000 | ORAL_TABLET | Freq: Four times a day (QID) | ORAL | Status: DC | PRN

## 2011-09-11 NOTE — ED Notes (Signed)
Shoulder immobilizer applied while pt in xray - pt comfortable at discharge  Had taken pain medication at home prior to arrival once immobilizer in place pt pain level decreased

## 2011-09-11 NOTE — ED Notes (Signed)
Dr Lestine Box paged

## 2011-09-11 NOTE — ED Notes (Signed)
Dr Lestine Box pages x 2

## 2011-09-11 NOTE — ED Notes (Signed)
Pt fell this am tripped on box landed on left shoulder arm on hard kitchen floor - c/o left hand weakness since fall - unable to raise left arm - n other injury

## 2011-09-13 ENCOUNTER — Emergency Department (HOSPITAL_COMMUNITY)
Admission: EM | Admit: 2011-09-13 | Discharge: 2011-09-13 | Disposition: A | Discharge status: Home / Self Care | Payer: Medicare Other | Attending: Emergency Medicine | Admitting: Emergency Medicine

## 2011-09-13 ENCOUNTER — Emergency Department (HOSPITAL_COMMUNITY): Payer: Medicare Other

## 2011-09-13 ENCOUNTER — Encounter (HOSPITAL_COMMUNITY): Payer: Self-pay | Admitting: Physical Medicine and Rehabilitation

## 2011-09-13 DIAGNOSIS — G8929 Other chronic pain: Secondary | ICD-10-CM | POA: Diagnosis not present

## 2011-09-13 DIAGNOSIS — M549 Dorsalgia, unspecified: Secondary | ICD-10-CM | POA: Diagnosis not present

## 2011-09-13 DIAGNOSIS — K219 Gastro-esophageal reflux disease without esophagitis: Secondary | ICD-10-CM | POA: Insufficient documentation

## 2011-09-13 DIAGNOSIS — M79609 Pain in unspecified limb: Secondary | ICD-10-CM | POA: Diagnosis not present

## 2011-09-13 DIAGNOSIS — S42309A Unspecified fracture of shaft of humerus, unspecified arm, initial encounter for closed fracture: Secondary | ICD-10-CM

## 2011-09-13 DIAGNOSIS — E785 Hyperlipidemia, unspecified: Secondary | ICD-10-CM | POA: Diagnosis not present

## 2011-09-13 DIAGNOSIS — F29 Unspecified psychosis not due to a substance or known physiological condition: Secondary | ICD-10-CM | POA: Diagnosis not present

## 2011-09-13 DIAGNOSIS — I10 Essential (primary) hypertension: Secondary | ICD-10-CM | POA: Diagnosis not present

## 2011-09-13 DIAGNOSIS — S42213A Unspecified displaced fracture of surgical neck of unspecified humerus, initial encounter for closed fracture: Secondary | ICD-10-CM | POA: Insufficient documentation

## 2011-09-13 DIAGNOSIS — S42209A Unspecified fracture of upper end of unspecified humerus, initial encounter for closed fracture: Secondary | ICD-10-CM | POA: Diagnosis not present

## 2011-09-13 DIAGNOSIS — M7989 Other specified soft tissue disorders: Secondary | ICD-10-CM | POA: Diagnosis not present

## 2011-09-13 DIAGNOSIS — W19XXXA Unspecified fall, initial encounter: Secondary | ICD-10-CM | POA: Insufficient documentation

## 2011-09-13 NOTE — ED Provider Notes (Signed)
History     CSN: [id number]  Arrival date & time 09/13/11  6295   First MD Initiated Contact with Patient 09/13/11 0831      Chief Complaint  Patient presents with  . Arm Pain    (Consider location/radiation/quality/duration/timing/severity/associated sxs/prior treatment) HPI Comments: Pt presents for evaluation of left arm pain.  She states she was treated on 7/27 at the urgent care after falling at home.  She tripped over a box at home and fell landing on her left arm.  She was diagnosed with a humerus fracture and given instructions to follow-up with a local orthopedic specialist.  She states since leaving the urgent care she has experienced increased swelling, mildly increased pain, and feels as if her condition has worsened.  She denies numbness or weakness in her left hand, fever, CP, SOB, or tense swelling of the arm.    She also adds that she called the orthopedist's office to arrange a follow-up appointment and felt as if the receptionist was rude.  She states that there was a shift in the tenor of their conversation when she told her that she used medicaid as her Solicitor.  She is concerned that she may not receive good care at that office.  Patient is a 66 y.o. female presenting with arm pain. The history is provided by the patient. No language interpreter was used.  Arm Pain This is a new problem. The current episode started more than 2 days ago. The problem occurs constantly. The problem has been gradually worsening. Pertinent negatives include no chest pain, no abdominal pain and no shortness of breath. The symptoms are aggravated by walking and stress (moving the arm). Relieved by: partially by percocet. The treatment provided mild relief.    Past Medical History  Diagnosis Date  . Hypertension   . Insomnia   . Vertigo   . Migraine   . Thyroid nodule 2010    TSH 04/2008 = 0.296, no follow up noted in [facility]  . HLD (hyperlipidemia)   . Psychosis     followed  by Dr. Harrison Mons [phone]  . Acid reflux   . Back pain     Past Surgical History  Procedure Date  . Uterine fibroid surgery   . Tonsillectomy     Family History  Problem Relation Age of Onset  . Stroke Neg Hx   . Heart disease Neg Hx   . Cancer Neg Hx     History  Substance Use Topics  . Smoking status: Never Smoker   . Smokeless tobacco: Not on file  . Alcohol Use: No    OB History    Grav Para Term Preterm Abortions TAB SAB Ect Mult Living                  Review of Systems  Constitutional: Negative.   HENT: Negative.   Respiratory: Negative for cough, chest tightness and shortness of breath.   Cardiovascular: Negative for chest pain and palpitations.  Gastrointestinal: Negative.  Negative for abdominal pain.  Genitourinary: Negative.   Musculoskeletal: Positive for back pain. Negative for joint swelling.       Chronic  Neurological: Negative for dizziness, syncope, weakness, light-headedness and numbness.    Allergies  Lidocaine and Olanzapine  Home Medications   Current Outpatient Rx  Name Route Sig Dispense Refill  . ALPRAZOLAM 0.5 MG PO TABS Oral Take 0.5 mg by mouth at bedtime as needed. For anxiety    . AMLODIPINE BESYLATE 5 MG  PO TABS  TAKE ONE TABLET BY MOUTH ONE TIME DAILY 30 tablet 4  . CALCIUM CARBONATE 1250 MG PO TABS Oral Take 1 tablet by mouth daily.    Marland Kitchen CITALOPRAM HYDROBROMIDE 40 MG PO TABS Oral Take 1.5 tablets (60 mg total) by mouth daily. 45 tablet 11  . IBUPROFEN 800 MG PO TABS Oral Take 1 tablet (800 mg total) by mouth 3 (three) times daily as needed. 30 tablet 2  . ADULT MULTIVITAMIN W/MINERALS CH Oral Take 1 tablet by mouth daily.    Marland Kitchen OMEPRAZOLE 20 MG PO CPDR Oral Take 1 capsule (20 mg total) by mouth daily. 30 capsule 1  . OXYCODONE-ACETAMINOPHEN 5-325 MG PO TABS Oral Take 1 tablet by mouth every 4 (four) hours as needed. For pain    . PREGABALIN 75 MG PO CAPS Oral Take 1 capsule (75 mg total) by mouth 2 (two) times daily. 90  capsule 2  . QUINAPRIL HCL 5 MG PO TABS Oral Take 1 tablet (5 mg total) by mouth daily. 30 tablet 11  . RANITIDINE HCL 150 MG PO TABS Oral Take 150 mg by mouth 2 (two) times daily.    Marland Kitchen SIMVASTATIN 20 MG PO TABS Oral Take 1 tablet (20 mg total) by mouth at bedtime. 30 tablet 1    BP 139/71  Pulse 96  Temp 98.6 F (37 C) (Oral)  Resp 18  SpO2 98%  LMP 03/27/1984  Physical Exam  Constitutional: She is oriented to person, place, and time. She appears well-developed and well-nourished. No distress.  HENT:  Head: Normocephalic and atraumatic.  Right Ear: External ear normal.  Left Ear: External ear normal.  Mouth/Throat: Oropharynx is clear and moist. No oropharyngeal exudate.  Eyes: Conjunctivae and EOM are normal. Pupils are equal, round, and reactive to light. Right eye exhibits no discharge. Left eye exhibits no discharge. No scleral icterus.  Neck: Normal range of motion. Neck supple. No JVD present. No tracheal deviation present.  Cardiovascular: Normal rate, regular rhythm and intact distal pulses.  Exam reveals no gallop and no friction rub.   No murmur heard. Pulmonary/Chest: Effort normal and breath sounds normal. No respiratory distress. She has no wheezes. She has no rales. She exhibits no tenderness.  Abdominal: Soft. Bowel sounds are normal.  Musculoskeletal: She exhibits tenderness. She exhibits no edema.       Left shoulder: She exhibits decreased range of motion, tenderness, bony tenderness and swelling. She exhibits no effusion, no crepitus, no laceration, no spasm, normal pulse and normal strength.       Arms:      Noted swelling and bruising associated with are  Of tenderness that extends distally almost to elbow  Neurological: She is alert and oriented to person, place, and time. She has normal strength. She displays no tremor. No cranial nerve deficit or sensory deficit. She exhibits normal muscle tone. GCS eye subscore is 4. GCS verbal subscore is 5. GCS motor  subscore is 6.       Sensation  In left forearm and hand are intact.  Skin: Skin is warm and dry. She is not diaphoretic. No erythema. No pallor.       Mild bruising noted of left upper arm with associated circumferential swelling.  Compartments of arm are however soft.  Psychiatric: She has a normal mood and affect. Her behavior is normal. Judgment and thought content normal.    ED Course  Procedures (including critical care time)  Labs Reviewed - No data to display  Dg Elbow Complete Left  09/11/2011  *RADIOLOGY REPORT*  Clinical Data: Fall,.  pain, proximal humeral fracture  LEFT ELBOW - COMPLETE 3+ VIEW  Comparison: None.  Findings: Two views of the left elbow submitted.  No acute fracture or subluxation.  No posterior fat pad sign.  IMPRESSION: No definite fracture or subluxation.  No posterior fat pad sign.  Original Report Authenticated By: Natasha Mead, M.D.   Dg Humerus Left  09/11/2011  *RADIOLOGY REPORT*  Clinical Data: Fall with left humerus pain.  LEFT HUMERUS - 2+ VIEW  Comparison: None  Findings: An oblique fracture through the humeral neck extending into the proximal diaphysis is noted.  2 mm medial-anterior displacement is noted. This fracture does not appear to extend into the humeral head. There is no evidence of subluxation or dislocation.  IMPRESSION: Humeral neck/proximal diaphyseal fracture with 2 mm medial - anterior displacement.  Original Report Authenticated By: Rosendo Gros, M.D.     No diagnosis found.    MDM  Pt stable, NAD.  Will repeat xray to see if there has been further displacement of this fracture that may explain worsening discomfort.  As results become available, will contact the on-call orthopedic surgeon to secure appropriate f/u.  Pt stable, NAD.  No signif changes noted on xray, plan d/c home to f/u with the orthopedic specialist for further evaluation and treatment of this fracture.        Tobin Chad, MD 09/13/11 1045

## 2011-09-13 NOTE — ED Notes (Signed)
Pt discharged home. Had no further questions. 

## 2011-09-13 NOTE — ED Notes (Signed)
Pt presents to department for evaluation of L arm pain. States she was seen on 09/11/11 for fractured L humerus. States shat she has appointment with [facility] this afternoon, but pain became worse this morning. 7/10 at the time. Sling in place upon arrival. Pt also requesting medication refill on oxycodone for chronic back pain. She is alert and oriented x4.

## 2011-09-13 NOTE — ED Notes (Signed)
Pt resting quietly at the time. 4/10 L arm pain. No signs of distress noted.

## 2011-09-14 ENCOUNTER — Encounter: Payer: Medicare Other | Admitting: Rehabilitation

## 2011-09-15 ENCOUNTER — Encounter: Payer: Medicare Other | Admitting: Rehabilitation

## 2011-09-16 ENCOUNTER — Encounter: Payer: Medicare Other | Admitting: Rehabilitation

## 2011-09-17 NOTE — ED Provider Notes (Signed)
History     CSN: [id number]  Arrival date & time 09/11/11  1300   First MD Initiated Contact with Patient 09/11/11 1705      Chief Complaint  Patient presents with  . Fall  . Arm Pain    (Consider location/radiation/quality/duration/timing/severity/associated sxs/prior treatment) HPI Comments: Pt tripped over a box in her kitchen, landing on L side, injuring L upper arm/elbow  Patient is a 66 y.o. female presenting with fall. The history is provided by the patient.  Fall The accident occurred 3 to 5 hours ago. The fall occurred while walking. Distance fallen: from standing. She landed on a hard floor. There was no blood loss. Point of impact: L upper arm. The pain is present in the left elbow (L upper arm). The pain is at a severity of 9/10. The pain is severe. She was ambulatory at the scene. Pertinent negatives include no visual change, no fever, no numbness and no vomiting. The symptoms are aggravated by use of the injured limb (palpation of injured area). She has tried nothing for the symptoms.    Past Medical History  Diagnosis Date  . Hypertension   . Insomnia   . Vertigo   . Migraine   . Thyroid nodule 2010    TSH 04/2008 = 0.296, no follow up noted in [facility]  . HLD (hyperlipidemia)   . Psychosis     followed by Dr. Harrison Mons [phone]  . Acid reflux   . Back pain     Past Surgical History  Procedure Date  . Uterine fibroid surgery   . Tonsillectomy     Family History  Problem Relation Age of Onset  . Stroke Neg Hx   . Heart disease Neg Hx   . Cancer Neg Hx     History  Substance Use Topics  . Smoking status: Never Smoker   . Smokeless tobacco: Not on file  . Alcohol Use: No    OB History    Grav Para Term Preterm Abortions TAB SAB Ect Mult Living                  Review of Systems  Constitutional: Negative for fever and chills.  Gastrointestinal: Negative for vomiting.  Musculoskeletal:       L upper arm pain/injury  Skin: Negative for  color change and wound.  Neurological: Negative for numbness.    Allergies  Lidocaine and Olanzapine  Home Medications   Current Outpatient Rx  Name Route Sig Dispense Refill  . AMLODIPINE BESYLATE 5 MG PO TABS  TAKE ONE TABLET BY MOUTH ONE TIME DAILY 30 tablet 4  . CITALOPRAM HYDROBROMIDE 40 MG PO TABS Oral Take 1.5 tablets (60 mg total) by mouth daily. 45 tablet 11  . IBUPROFEN 800 MG PO TABS Oral Take 1 tablet (800 mg total) by mouth 3 (three) times daily as needed. 30 tablet 2  . PREGABALIN 75 MG PO CAPS Oral Take 1 capsule (75 mg total) by mouth 2 (two) times daily. 90 capsule 2  . QUINAPRIL HCL 5 MG PO TABS Oral Take 1 tablet (5 mg total) by mouth daily. 30 tablet 11  . RANITIDINE HCL 150 MG PO TABS Oral Take 150 mg by mouth 2 (two) times daily.    Marland Kitchen SIMVASTATIN 20 MG PO TABS Oral Take 1 tablet (20 mg total) by mouth at bedtime. 30 tablet 1  . ALPRAZOLAM 0.5 MG PO TABS Oral Take 0.5 mg by mouth at bedtime as needed. For anxiety    .  CALCIUM CARBONATE 1250 MG PO TABS Oral Take 1 tablet by mouth daily.    . ADULT MULTIVITAMIN W/MINERALS CH Oral Take 1 tablet by mouth daily.    Marland Kitchen OMEPRAZOLE 20 MG PO CPDR Oral Take 1 capsule (20 mg total) by mouth daily. 30 capsule 1  . OXYCODONE-ACETAMINOPHEN 5-325 MG PO TABS Oral Take 1 tablet by mouth every 4 (four) hours as needed. For pain      BP 132/77  Pulse 92  Temp 98.7 F (37.1 C) (Oral)  Resp 22  SpO2 100%  LMP 03/27/1984  Physical Exam  Constitutional: She appears well-developed and well-nourished. No distress.       Uncomfortable appearing  HENT:  Head: Normocephalic and atraumatic.  Pulmonary/Chest: Effort normal.  Musculoskeletal:       Left shoulder: She exhibits no tenderness, no bony tenderness and no deformity.       Left elbow: She exhibits swelling. She exhibits normal range of motion and no deformity. tenderness found.       Left upper arm: She exhibits tenderness and bony tenderness. She exhibits no swelling, no  deformity and no laceration.       Vague, generalized tenderness to L elbow, laxity L elbow with ROM. Significantly more tender to palp L upper arm  Skin: Skin is warm, dry and intact.    ED Course  Procedures (including critical care time)  Labs Reviewed - No data to display No results found.   1. Fx humeral neck       MDM  Discussed with Dr. Magnus Ivan, who requests L shoulder immobilizer and pt to f/u in clinic.         Cathlyn Parsons, NP 09/17/11 2234

## 2011-09-18 NOTE — ED Provider Notes (Signed)
Medical screening examination/treatment/procedure(s) were performed by non-physician practitioner and as supervising physician I was immediately available for consultation/collaboration.  Raynald Blend, MD 09/18/11 1038

## 2011-09-22 ENCOUNTER — Encounter: Payer: Medicare Other | Admitting: Rehabilitation

## 2011-09-23 ENCOUNTER — Telehealth: Payer: Self-pay | Admitting: *Deleted

## 2011-09-23 NOTE — Telephone Encounter (Signed)
Thanks

## 2011-09-23 NOTE — Telephone Encounter (Signed)
Pt's daughter calls and states pt fractured L arm 09/13/2011 and is in need of home health assist, she has not had f/u w/ primary since this so she will be here for eval and referral, daughter states she is not receiving physical therapy at this time, may need a home health eval and treat visit. appt 8/9 1315 dr Dorise Hiss per chilonb.

## 2011-09-27 ENCOUNTER — Ambulatory Visit (INDEPENDENT_AMBULATORY_CARE_PROVIDER_SITE_OTHER): Payer: Medicare Other | Admitting: Internal Medicine

## 2011-09-27 ENCOUNTER — Encounter: Payer: Medicare Other | Admitting: Internal Medicine

## 2011-09-27 ENCOUNTER — Encounter: Payer: Self-pay | Admitting: Licensed Clinical Social Worker

## 2011-09-27 ENCOUNTER — Encounter: Payer: Self-pay | Admitting: Internal Medicine

## 2011-09-27 VITALS — BP 133/84 | HR 90 | Temp 98.8°F | Ht 65.0 in | Wt 155.5 lb

## 2011-09-27 DIAGNOSIS — S42309A Unspecified fracture of shaft of humerus, unspecified arm, initial encounter for closed fracture: Secondary | ICD-10-CM | POA: Diagnosis not present

## 2011-09-27 DIAGNOSIS — S42302A Unspecified fracture of shaft of humerus, left arm, initial encounter for closed fracture: Secondary | ICD-10-CM

## 2011-09-27 MED ORDER — OXYCODONE-ACETAMINOPHEN 7.5-325 MG PO TABS: 1.0000 | ORAL_TABLET | ORAL | Status: DC | PRN

## 2011-09-27 NOTE — Patient Instructions (Signed)
You were seen today for your arm pain. We are going to give you a stronger pain medicine that you can use every 4-6 hours. We will give you a prescription for a cane and also for some help with personal care at home. Please go to see the orthopedic doctor. If you have any questions or problems please call us at [phone]. Please come back to see [place] in 1 month to check on your arm.

## 2011-09-27 NOTE — Progress Notes (Signed)
Doris [name] presents has a walk-in to [facility] today.  Pt has appt scheduled today at 1345 with Dr. Dorise Hiss.  Doris [name] recently had an ED visit for a fractured humerus, it was immobilized in the ED.  Pt has cancelled several Rehab appt's, CSW unsure if related to this injury or previous.  CSW encouraged pt to discuss with physician today regarding rehab services outpt/home.  Pt in to speak with CSW today for [facility] services.  Doris [name] states she is unable to provide her personal care or home care.  CSW will initiate request and route to Dr. Dorise Hiss.

## 2011-09-27 NOTE — Progress Notes (Signed)
Subjective:     Patient ID: Doris [name], female   DOB: 12-06-1945, 67 y.o.   MRN: [id number]  HPI The patient is a 66 year old woman who comes in for some home health assistance while her left arm is fractured at the humeral neck. She did fall and break her arm end of July and is coming in because she has it in an immobilizer and cannot take care of all her needs. She is having moderate relief with her current pain medication however cannot have it controlled all the time. She is doing well but needs some help. No other concerns at today's visit. No other refills needed.   Review of Systems  Constitutional: Positive for activity change. Negative for fever, chills, diaphoresis, appetite change, fatigue and unexpected weight change.       Patient is unable to do her usual activities with recent fracture.   Respiratory: Negative for cough, choking, chest tightness and shortness of breath.   Cardiovascular: Negative for chest pain, palpitations and leg swelling.  Gastrointestinal: Negative.   Musculoskeletal: Positive for arthralgias. Negative for myalgias, back pain, joint swelling and gait problem.  Skin: Negative.   Neurological: Negative.   Hematological: Negative.        Objective:   Physical Exam  Constitutional: She is oriented to person, place, and time. She appears well-developed and well-nourished. No distress.  HENT:  Head: Normocephalic and atraumatic.  Eyes: EOM are normal. Pupils are equal, round, and reactive to light.  Neck: Normal range of motion. Neck supple.  Cardiovascular: Normal rate and regular rhythm.   Pulmonary/Chest: Effort normal and breath sounds normal.  Abdominal: Soft. Bowel sounds are normal.  Musculoskeletal: She exhibits tenderness.       L arm is in an immobilizer.   Neurological: She is alert and oriented to person, place, and time.  Skin: Skin is warm. She is not diaphoretic.       Assessment:   1. L humeral fracture - The patient does have  follow up with orthopedics to determine stability of her arm fracture and for further plan. She is currently in immobilizer and is unable to perform her ADLs and housework. Will put in referral to social work and fill out paperwork regarding home health and PCS services. Will also give her refill of percocet 7.5/325 60 tabs. Strongly encouraged the importance of following with orthopedics for this fracture as it may need surgery if it is not healing appropriately.   2. Disposition - Will see the patient back in 1 month for follow up and to see if she is still needing PCS services. Refill percocet 7.5/325 60 tabs.

## 2011-09-29 DIAGNOSIS — S42209A Unspecified fracture of upper end of unspecified humerus, initial encounter for closed fracture: Secondary | ICD-10-CM | POA: Diagnosis not present

## 2011-10-08 ENCOUNTER — Other Ambulatory Visit: Payer: Self-pay | Admitting: *Deleted

## 2011-10-08 DIAGNOSIS — I10 Essential (primary) hypertension: Secondary | ICD-10-CM

## 2011-10-11 MED ORDER — AMLODIPINE BESYLATE 5 MG PO TABS: 5.0000 mg | ORAL_TABLET | Freq: Every day | ORAL | Status: DC

## 2011-10-13 ENCOUNTER — Other Ambulatory Visit: Payer: Self-pay | Admitting: *Deleted

## 2011-10-13 MED ORDER — ALPRAZOLAM 0.5 MG PO TABS: 0.5000 mg | ORAL_TABLET | Freq: Every evening | ORAL | Status: DC | PRN

## 2011-10-13 NOTE — Telephone Encounter (Signed)
Patient was prescribed 0.25mg  xanax for help with insomnia and anxiety before bedtime.

## 2011-10-13 NOTE — Telephone Encounter (Signed)
Last dispensed 09/15/11

## 2011-10-13 NOTE — Telephone Encounter (Signed)
Rx called in 

## 2011-10-14 DIAGNOSIS — S42209A Unspecified fracture of upper end of unspecified humerus, initial encounter for closed fracture: Secondary | ICD-10-CM | POA: Diagnosis not present

## 2011-11-08 ENCOUNTER — Other Ambulatory Visit: Payer: Self-pay | Admitting: *Deleted

## 2011-11-08 DIAGNOSIS — S42209A Unspecified fracture of upper end of unspecified humerus, initial encounter for closed fracture: Secondary | ICD-10-CM | POA: Diagnosis not present

## 2011-11-08 MED ORDER — CITALOPRAM HYDROBROMIDE 40 MG PO TABS: 60.0000 mg | ORAL_TABLET | Freq: Every day | ORAL | Status: DC

## 2011-11-08 NOTE — Telephone Encounter (Signed)
Pt has an appt 12/09/11.

## 2011-11-11 ENCOUNTER — Ambulatory Visit: Payer: Medicare Other | Attending: Internal Medicine | Admitting: Physical Therapy

## 2011-11-11 DIAGNOSIS — R5381 Other malaise: Secondary | ICD-10-CM | POA: Insufficient documentation

## 2011-11-11 DIAGNOSIS — M6281 Muscle weakness (generalized): Secondary | ICD-10-CM | POA: Insufficient documentation

## 2011-11-11 DIAGNOSIS — M2569 Stiffness of other specified joint, not elsewhere classified: Secondary | ICD-10-CM | POA: Insufficient documentation

## 2011-11-11 DIAGNOSIS — M2550 Pain in unspecified joint: Secondary | ICD-10-CM | POA: Diagnosis not present

## 2011-11-11 DIAGNOSIS — IMO0001 Reserved for inherently not codable concepts without codable children: Secondary | ICD-10-CM | POA: Diagnosis not present

## 2011-11-11 DIAGNOSIS — R293 Abnormal posture: Secondary | ICD-10-CM | POA: Diagnosis not present

## 2011-11-25 ENCOUNTER — Ambulatory Visit: Payer: Medicare Other | Attending: Internal Medicine

## 2011-11-25 DIAGNOSIS — M2569 Stiffness of other specified joint, not elsewhere classified: Secondary | ICD-10-CM | POA: Diagnosis not present

## 2011-11-25 DIAGNOSIS — M2550 Pain in unspecified joint: Secondary | ICD-10-CM | POA: Diagnosis not present

## 2011-11-25 DIAGNOSIS — R293 Abnormal posture: Secondary | ICD-10-CM | POA: Insufficient documentation

## 2011-11-25 DIAGNOSIS — M6281 Muscle weakness (generalized): Secondary | ICD-10-CM | POA: Diagnosis not present

## 2011-11-25 DIAGNOSIS — IMO0001 Reserved for inherently not codable concepts without codable children: Secondary | ICD-10-CM | POA: Insufficient documentation

## 2011-11-25 DIAGNOSIS — R5381 Other malaise: Secondary | ICD-10-CM | POA: Insufficient documentation

## 2011-11-30 ENCOUNTER — Ambulatory Visit: Payer: Medicare Other | Admitting: Physical Therapy

## 2011-12-02 ENCOUNTER — Ambulatory Visit: Payer: Medicare Other | Admitting: Physical Therapy

## 2011-12-07 ENCOUNTER — Ambulatory Visit: Payer: Medicare Other | Admitting: Physical Therapy

## 2011-12-08 ENCOUNTER — Ambulatory Visit: Payer: Medicare Other | Admitting: Physical Therapy

## 2011-12-09 ENCOUNTER — Encounter: Payer: Self-pay | Admitting: Internal Medicine

## 2011-12-09 ENCOUNTER — Ambulatory Visit (INDEPENDENT_AMBULATORY_CARE_PROVIDER_SITE_OTHER): Payer: Medicare Other | Admitting: Internal Medicine

## 2011-12-09 ENCOUNTER — Other Ambulatory Visit: Payer: Self-pay | Admitting: *Deleted

## 2011-12-09 VITALS — BP 141/82 | HR 86 | Temp 98.4°F | Ht 65.0 in | Wt 158.3 lb

## 2011-12-09 DIAGNOSIS — F411 Generalized anxiety disorder: Secondary | ICD-10-CM | POA: Diagnosis not present

## 2011-12-09 DIAGNOSIS — E785 Hyperlipidemia, unspecified: Secondary | ICD-10-CM

## 2011-12-09 DIAGNOSIS — K219 Gastro-esophageal reflux disease without esophagitis: Secondary | ICD-10-CM

## 2011-12-09 DIAGNOSIS — Z23 Encounter for immunization: Secondary | ICD-10-CM | POA: Diagnosis not present

## 2011-12-09 DIAGNOSIS — I10 Essential (primary) hypertension: Secondary | ICD-10-CM

## 2011-12-09 DIAGNOSIS — F419 Anxiety disorder, unspecified: Secondary | ICD-10-CM

## 2011-12-09 LAB — COMPLETE METABOLIC PANEL WITH GFR
ALT: 12 U/L (ref 0–35)
CO2: 27 mEq/L (ref 19–32)
Chloride: 102 mEq/L (ref 96–112)
GFR, Est African American: >89 mL/min
Sodium: 139 mEq/L (ref 135–145)
Total Bilirubin: 0.3 mg/dL (ref 0.3–1.2)
Total Protein: 6.9 g/dL (ref 6.0–8.3)

## 2011-12-09 LAB — CBC WITH DIFFERENTIAL/PLATELET
Basophils Relative: 0 % (ref 0–1)
Eosinophils Absolute: 0.2 10*3/uL (ref 0.0–0.7)
Eosinophils Relative: 3 % (ref 0–5)
Hemoglobin: 13.9 g/dL (ref 12.0–15.0)
MCH: 28.5 pg (ref 26.0–34.0)
MCHC: 34.1 g/dL (ref 30.0–36.0)
MCV: 83.6 fL (ref 78.0–100.0)
Monocytes Absolute: 0.6 10*3/uL (ref 0.1–1.0)
Monocytes Relative: 10 % (ref 3–12)
Neutrophils Relative %: 57 % (ref 43–77)

## 2011-12-09 LAB — LIPID PANEL
HDL: 55 mg/dL (ref >39)
LDL Cholesterol: 191 mg/dL — ABNORMAL HIGH (ref 0–99)

## 2011-12-09 MED ORDER — CITALOPRAM HYDROBROMIDE 40 MG PO TABS: 60.0000 mg | ORAL_TABLET | Freq: Every day | ORAL | Status: DC

## 2011-12-09 MED ORDER — QUINAPRIL HCL 5 MG PO TABS: 5.0000 mg | ORAL_TABLET | Freq: Every day | ORAL | Status: DC

## 2011-12-09 MED ORDER — SIMVASTATIN 20 MG PO TABS: 20.0000 mg | ORAL_TABLET | Freq: Every day | ORAL | Status: DC

## 2011-12-09 MED ORDER — LORAZEPAM 0.5 MG PO TABS: 0.5000 mg | ORAL_TABLET | Freq: Every day | ORAL | Status: DC | PRN

## 2011-12-09 MED ORDER — AMLODIPINE BESYLATE 5 MG PO TABS: 5.0000 mg | ORAL_TABLET | Freq: Every day | ORAL | Status: DC

## 2011-12-09 MED ORDER — RANITIDINE HCL 150 MG PO TABS: 150.0000 mg | ORAL_TABLET | Freq: Two times a day (BID) | ORAL | Status: DC

## 2011-12-09 NOTE — Progress Notes (Signed)
Subjective:    Patient ID: Doris [name], female    DOB: [birth year], 66 y.o.   MRN: [id number]  HPI Feels well. She states she is following physical therapy for left shoulder fracture.  She states she is following with [facility] Dr. Amanda Pea for her left humerus fx.  She states she fell after tripping over a box while she was doing "spring cleaning". Denies any further falls.  She feels a bit unsteady at times, but no further falls.  Denies fever, chills. Denies N/V/D/C.  Denies CP, SOB. Denies melena, hematochezia. She states she has not had a colonoscopy and wants to defer until "after the holidays". I advised her I would prefer her to have it done now, but she does not want to do this now. I advised her this is against my advice. She has not had the flu vaccine and states she would agree to one today. She has not had pneumovax and states she would agree to it today. She would like to defer mammogram for now until her left arm is more mobile. She would like to defer PAP smear at this time.  She states she "was going to behavioral health", but cannot at this time due to inability to drive.  She states we have been giving her Xanax.  Review of narcotic database shows she last filled Xanax 0.5 mg on 10/13/11 #30.  She states she takes it only when she feels "very anxious", but not daily.  She states she feels that [place] made her fall. She admits that she combines therapy with an antihistamine at times to help her sleep. I advised her of the consequences of these medications and increased risk of falls.  Follows with Dr. Sharl Ma for her thyroid. She is not currently on any medication.  Review of Systems Complete 12 points review of systems otherwise negative except for that stated in the HPI.    Objective:   Physical Exam Filed Vitals:   12/09/11 0945  BP: 141/82  Pulse: 86  Temp: 98.4 F (36.9 C)  GEN: AAOx3, NAD. HEENT: EOMI, PERRLA, no icterus, no masses, no adenopathy. CV:  S1S2, no m/r/g, RRR. PULM: CTA bilat. ABD/GI: Soft, NT, +BS, no guarding, no HSM. LE/UE: 2/4 pulses, no c/c/e. LUE with immobilizer proximally. NEURO: CN II - XII intact, no focal deficits.  She is able to get up out of chair in less than 3 secs, no instability in gait noted.     Assessment & Plan:  66 yr. Old female w/ hx depression w/ psychotic features, anxiety, GERD, subclinical hyperthyrodism (Dr. Sharl Ma), HL, HTN, hx proximal left humerus neck/diaphyseal fracture with mild  Displacement in 7/13, presents for follow up. 1) HTN: Refill her current medications, admits pain increases her BP. 2) HL: currently on statin, check lipid panel, LFT's. 3) GERD: refill zantac, states it helps her. I advised her to decrease the use of ibuprofen daily and attempt to wean off. 4) Left humerus fracture: She is undergoing physical therapy. She is following with Dr. Amanda Pea. 5) Subclinical hyperthyroidism: following with Dr. Sharl Ma. 6) Depression/anxiety: I would continue Celexa right now. I had a long discussion about use of BZD and falls, she states xanax helps her and takes it seldomly. I advised her she needs to go to behavioral health. Change to ativan 0.5 mg to take daily as needed. Advised her to take this with caution, not to drive while taking this. 7) Recent fall: Refer to physical therapy, but she refuses at this  time. I advised her this is important, but she declines. 8) Health Maintenance: Flu vaccine today, pneumovax today. She does not want colonoscopy or mammogram referral today, I discussed importance of this.   Return in 3 months. Jonah Blue

## 2011-12-09 NOTE — Telephone Encounter (Signed)
review 

## 2011-12-09 NOTE — Patient Instructions (Addendum)
-  blood work today. -pneumovax and flu vaccine today. -return in 3 months.

## 2011-12-13 ENCOUNTER — Ambulatory Visit: Payer: Medicare Other | Admitting: Physical Therapy

## 2011-12-15 ENCOUNTER — Ambulatory Visit: Payer: Medicare Other | Admitting: Physical Therapy

## 2011-12-16 DIAGNOSIS — S42209A Unspecified fracture of upper end of unspecified humerus, initial encounter for closed fracture: Secondary | ICD-10-CM | POA: Diagnosis not present

## 2011-12-21 ENCOUNTER — Ambulatory Visit: Payer: Medicare Other | Attending: Internal Medicine | Admitting: Physical Therapy

## 2011-12-21 DIAGNOSIS — IMO0001 Reserved for inherently not codable concepts without codable children: Secondary | ICD-10-CM | POA: Diagnosis not present

## 2011-12-21 DIAGNOSIS — R5381 Other malaise: Secondary | ICD-10-CM | POA: Insufficient documentation

## 2011-12-21 DIAGNOSIS — R293 Abnormal posture: Secondary | ICD-10-CM | POA: Insufficient documentation

## 2011-12-21 DIAGNOSIS — M2569 Stiffness of other specified joint, not elsewhere classified: Secondary | ICD-10-CM | POA: Insufficient documentation

## 2011-12-21 DIAGNOSIS — M6281 Muscle weakness (generalized): Secondary | ICD-10-CM | POA: Insufficient documentation

## 2011-12-21 DIAGNOSIS — M2550 Pain in unspecified joint: Secondary | ICD-10-CM | POA: Insufficient documentation

## 2011-12-22 ENCOUNTER — Ambulatory Visit: Payer: Medicare Other | Admitting: Physical Therapy

## 2011-12-28 ENCOUNTER — Ambulatory Visit: Payer: Medicare Other | Admitting: Physical Therapy

## 2011-12-30 ENCOUNTER — Ambulatory Visit: Payer: Medicare Other | Admitting: Physical Therapy

## 2012-01-04 ENCOUNTER — Ambulatory Visit: Payer: Medicare Other | Admitting: Physical Therapy

## 2012-01-05 DIAGNOSIS — F411 Generalized anxiety disorder: Secondary | ICD-10-CM | POA: Diagnosis not present

## 2012-01-05 DIAGNOSIS — F330 Major depressive disorder, recurrent, mild: Secondary | ICD-10-CM | POA: Diagnosis not present

## 2012-01-06 ENCOUNTER — Ambulatory Visit: Payer: Medicare Other | Admitting: Physical Therapy

## 2012-01-18 ENCOUNTER — Ambulatory Visit: Payer: Medicare Other | Attending: Internal Medicine | Admitting: Physical Therapy

## 2012-01-18 DIAGNOSIS — M6281 Muscle weakness (generalized): Secondary | ICD-10-CM | POA: Diagnosis not present

## 2012-01-18 DIAGNOSIS — R5381 Other malaise: Secondary | ICD-10-CM | POA: Diagnosis not present

## 2012-01-18 DIAGNOSIS — IMO0001 Reserved for inherently not codable concepts without codable children: Secondary | ICD-10-CM | POA: Diagnosis not present

## 2012-01-18 DIAGNOSIS — M2550 Pain in unspecified joint: Secondary | ICD-10-CM | POA: Insufficient documentation

## 2012-01-18 DIAGNOSIS — M2569 Stiffness of other specified joint, not elsewhere classified: Secondary | ICD-10-CM | POA: Diagnosis not present

## 2012-01-18 DIAGNOSIS — R293 Abnormal posture: Secondary | ICD-10-CM | POA: Diagnosis not present

## 2012-01-20 ENCOUNTER — Ambulatory Visit: Payer: Medicare Other | Admitting: Physical Therapy

## 2012-01-21 DIAGNOSIS — IMO0001 Reserved for inherently not codable concepts without codable children: Secondary | ICD-10-CM | POA: Diagnosis not present

## 2012-01-25 ENCOUNTER — Ambulatory Visit: Payer: Medicare Other | Admitting: Physical Therapy

## 2012-02-01 DIAGNOSIS — F330 Major depressive disorder, recurrent, mild: Secondary | ICD-10-CM | POA: Diagnosis not present

## 2012-02-01 DIAGNOSIS — F411 Generalized anxiety disorder: Secondary | ICD-10-CM | POA: Diagnosis not present

## 2012-02-02 ENCOUNTER — Ambulatory Visit: Payer: Medicare Other | Admitting: Physical Therapy

## 2012-02-17 ENCOUNTER — Ambulatory Visit: Payer: Medicare Other | Attending: Internal Medicine | Admitting: Physical Therapy

## 2012-02-17 DIAGNOSIS — M2550 Pain in unspecified joint: Secondary | ICD-10-CM | POA: Insufficient documentation

## 2012-02-17 DIAGNOSIS — R293 Abnormal posture: Secondary | ICD-10-CM | POA: Diagnosis not present

## 2012-02-17 DIAGNOSIS — M6281 Muscle weakness (generalized): Secondary | ICD-10-CM | POA: Insufficient documentation

## 2012-02-17 DIAGNOSIS — R5381 Other malaise: Secondary | ICD-10-CM | POA: Insufficient documentation

## 2012-02-17 DIAGNOSIS — M2569 Stiffness of other specified joint, not elsewhere classified: Secondary | ICD-10-CM | POA: Diagnosis not present

## 2012-02-17 DIAGNOSIS — IMO0001 Reserved for inherently not codable concepts without codable children: Secondary | ICD-10-CM | POA: Diagnosis not present

## 2012-02-22 ENCOUNTER — Ambulatory Visit: Payer: Medicare Other | Admitting: Physical Therapy

## 2012-02-22 DIAGNOSIS — IMO0001 Reserved for inherently not codable concepts without codable children: Secondary | ICD-10-CM | POA: Diagnosis not present

## 2012-02-22 DIAGNOSIS — M2569 Stiffness of other specified joint, not elsewhere classified: Secondary | ICD-10-CM | POA: Diagnosis not present

## 2012-02-22 DIAGNOSIS — R293 Abnormal posture: Secondary | ICD-10-CM | POA: Diagnosis not present

## 2012-02-22 DIAGNOSIS — M6281 Muscle weakness (generalized): Secondary | ICD-10-CM | POA: Diagnosis not present

## 2012-02-22 DIAGNOSIS — R5381 Other malaise: Secondary | ICD-10-CM | POA: Diagnosis not present

## 2012-02-22 DIAGNOSIS — M2550 Pain in unspecified joint: Secondary | ICD-10-CM | POA: Diagnosis not present

## 2012-02-24 ENCOUNTER — Ambulatory Visit: Payer: Medicare Other | Admitting: Physical Therapy

## 2012-02-24 DIAGNOSIS — M6281 Muscle weakness (generalized): Secondary | ICD-10-CM | POA: Diagnosis not present

## 2012-02-24 DIAGNOSIS — R293 Abnormal posture: Secondary | ICD-10-CM | POA: Diagnosis not present

## 2012-02-24 DIAGNOSIS — R5381 Other malaise: Secondary | ICD-10-CM | POA: Diagnosis not present

## 2012-02-24 DIAGNOSIS — M2550 Pain in unspecified joint: Secondary | ICD-10-CM | POA: Diagnosis not present

## 2012-02-24 DIAGNOSIS — IMO0001 Reserved for inherently not codable concepts without codable children: Secondary | ICD-10-CM | POA: Diagnosis not present

## 2012-02-24 DIAGNOSIS — M2569 Stiffness of other specified joint, not elsewhere classified: Secondary | ICD-10-CM | POA: Diagnosis not present

## 2012-02-29 DIAGNOSIS — F411 Generalized anxiety disorder: Secondary | ICD-10-CM | POA: Diagnosis not present

## 2012-02-29 DIAGNOSIS — F330 Major depressive disorder, recurrent, mild: Secondary | ICD-10-CM | POA: Diagnosis not present

## 2012-03-01 ENCOUNTER — Encounter: Payer: Self-pay | Admitting: Internal Medicine

## 2012-03-01 ENCOUNTER — Ambulatory Visit: Payer: Medicare Other | Admitting: Internal Medicine

## 2012-03-01 ENCOUNTER — Ambulatory Visit (INDEPENDENT_AMBULATORY_CARE_PROVIDER_SITE_OTHER): Payer: Medicare Other | Admitting: Internal Medicine

## 2012-03-01 VITALS — BP 129/78 | HR 84 | Temp 98.4°F | Ht 62.0 in | Wt 158.2 lb

## 2012-03-01 DIAGNOSIS — M1990 Unspecified osteoarthritis, unspecified site: Secondary | ICD-10-CM | POA: Insufficient documentation

## 2012-03-01 DIAGNOSIS — E785 Hyperlipidemia, unspecified: Secondary | ICD-10-CM

## 2012-03-01 DIAGNOSIS — F323 Major depressive disorder, single episode, severe with psychotic features: Secondary | ICD-10-CM

## 2012-03-01 NOTE — Assessment & Plan Note (Signed)
Greatly appreciate Psych assistance.

## 2012-03-01 NOTE — Patient Instructions (Signed)
General Instructions: Your knee pain is likely due to osteoarthritis.  We have sent a referral to orthopedic surgery for this issue.  I recommended a knee x-ray, but given your preference to have that performed at [facility], we can pursue that course of action.  For your cholesterol, it is very important to take your Simvastatin medication every day.  Walking daily can also help lower your cholesterol.   Treatment Goals:  Goals (1 Years of Data) as of 03/01/2012          As of Today 12/09/11 09/27/11 09/13/11 09/11/11     Blood Pressure    . Blood Pressure < 140/90  129/78 141/82 133/84 139/71 132/77     Result Component    . LDL CALC < 130   191         Progress Toward Treatment Goals:  Treatment Goal 03/01/2012  Blood pressure at goal    Self Care Goals & Plans:  Self Care Goal 03/01/2012  Manage my medications take my medicines as prescribed; bring my medications to every visit; refill my medications on time  Eat healthy foods drink diet soda or water instead of juice or soda; eat more vegetables       Care Management & Community Referrals:  Referral 03/01/2012  Referrals made for care management support none needed

## 2012-03-01 NOTE — Progress Notes (Signed)
HPI The patient is a 67 y.o. female with a history of HTN, HL, subclinical hyperthyroidism, depression, presenting for a follow-up visit, requesting orthopedic evaluation.  The patient notes a >10 year history of bilateral knee pain, left worse than right, secondary to osteoarthritis.  She previously worked as a Engineer, civil (consulting), and states she was "on my feet all the time".  The pain is worse when standing after being seated for a long time, and she notes morning stiffness of about 10 minutes.  She requests referral to an orthopedic surgeon, and already sees Dr. Amanda Pea.  The patient was previously taking ibuprofen, but has discontinued this at the advice of a previous physician.  The patient sees Psychiatry, Dr. Jannifer Franklin at the [facility] (p [phone]), who has prescribed clonazepam.  The patient has a history of HL, with high LDL noted at her last visit.  She is currently on simvastatin, but admits to occasional medication non-compliance.  She notes that has now made dietary changes and is starting to walk more.  ROS: General: no fevers, chills, changes in weight, changes in appetite Skin: no rash HEENT: no blurry vision, hearing changes, sore throat Pulm: no dyspnea, coughing, wheezing CV: no chest pain, palpitations, shortness of breath Abd: no abdominal pain, nausea/vomiting, diarrhea/constipation GU: no dysuria, hematuria, polyuria Ext: no arthralgias, myalgias Neuro: no weakness, numbness, or tingling  Filed Vitals:   03/01/12 1010  BP: 129/78  Pulse: 84  Temp: 98.4 F (36.9 C)    PEX General: alert, cooperative, and in no apparent distress HEENT: pupils equal round and reactive to light, vision grossly intact, oropharynx clear and non-erythematous  Neck: supple, no lymphadenopathy Lungs: clear to ascultation bilaterally, normal work of respiration, no wheezes, rales, ronchi Heart: regular rate and rhythm, no murmurs, gallops, or rubs Abdomen: soft, non-tender,  non-distended, normal bowel sounds Extremities: no cyanosis, clubbing, or edema.  Left knee with crepitus on passive ROM, though with no pain elicited on palpation. Neurologic: alert & oriented X3, cranial nerves II-XII intact, strength grossly intact, sensation intact to light touch  Current Outpatient Prescriptions on File Prior to Visit  Medication Sig Dispense Refill  . amLODipine (NORVASC) 5 MG tablet Take 1 tablet (5 mg total) by mouth daily.  30 tablet  4  . calcium carbonate (OS-CAL - DOSED IN MG OF ELEMENTAL CALCIUM) 1250 MG tablet Take 1 tablet by mouth daily.      . citalopram (CELEXA) 40 MG tablet Take 1.5 tablets (60 mg total) by mouth daily.  45 tablet  3  . ibuprofen (ADVIL,MOTRIN) 800 MG tablet Take 1 tablet (800 mg total) by mouth 3 (three) times daily as needed.  30 tablet  2  . LORazepam (ATIVAN) 0.5 MG tablet Take 1 tablet (0.5 mg total) by mouth daily as needed for anxiety.  30 tablet  0  . Multiple Vitamin (MULTIVITAMIN WITH MINERALS) TABS Take 1 tablet by mouth daily.      Marland Kitchen oxyCODONE-acetaminophen (PERCOCET) 7.5-325 MG per tablet Take 1 tablet by mouth every 4 (four) hours as needed for pain. Use every 4-6 hours as needed for pain.  60 tablet  0  . pregabalin (LYRICA) 75 MG capsule Take 1 capsule (75 mg total) by mouth 2 (two) times daily.  90 capsule  2  . quinapril (ACCUPRIL) 5 MG tablet Take 1 tablet (5 mg total) by mouth daily.  30 tablet  3  . ranitidine (ZANTAC) 150 MG tablet Take 1 tablet (150 mg total) by mouth 2 (two) times  daily.  60 tablet  3  . simvastatin (ZOCOR) 20 MG tablet Take 1 tablet (20 mg total) by mouth at bedtime.  30 tablet  3    Assessment/Plan

## 2012-03-01 NOTE — Assessment & Plan Note (Signed)
The patient has a history of bilateral knee osteoarthritis, L>R.  The patient requests referral to orthopedic surgery (patient already sees [facility] for her arms).  Patient seems resistant and almost hostile to discussing ways in which we can address her pain, and keeps repeating that she wants a referral.  At this point, I think referral is reasonable for knee osteoarthritis.  I advised the patient to obtain knee x-rays prior to appointment, but patient states she'd rather get the x-ray at [facility]. -will refer to orthopedics -patient declines all interventions from [place]

## 2012-03-01 NOTE — Assessment & Plan Note (Signed)
LDL 191 at last check is significantly higher than prior values.  May be an outlier or may represent patient's admission of medication non-compliance.  We discussed the importance of this medication and cholesterol control. -continue simvastatin, stressed the importance of daily compliance -encouraged patient to continue dietary/exercise changes -will recheck lipids in about 6 months, after patient has made the above changes

## 2012-03-03 DIAGNOSIS — M1710 Unilateral primary osteoarthritis, unspecified knee: Secondary | ICD-10-CM | POA: Diagnosis not present

## 2012-03-08 DIAGNOSIS — E042 Nontoxic multinodular goiter: Secondary | ICD-10-CM | POA: Diagnosis not present

## 2012-03-13 DIAGNOSIS — E042 Nontoxic multinodular goiter: Secondary | ICD-10-CM | POA: Diagnosis not present

## 2012-03-13 DIAGNOSIS — E0590 Thyrotoxicosis, unspecified without thyrotoxic crisis or storm: Secondary | ICD-10-CM | POA: Diagnosis not present

## 2012-03-13 DIAGNOSIS — G4700 Insomnia, unspecified: Secondary | ICD-10-CM | POA: Diagnosis not present

## 2012-04-19 DIAGNOSIS — F330 Major depressive disorder, recurrent, mild: Secondary | ICD-10-CM | POA: Diagnosis not present

## 2012-04-19 DIAGNOSIS — F411 Generalized anxiety disorder: Secondary | ICD-10-CM | POA: Diagnosis not present

## 2012-04-27 ENCOUNTER — Emergency Department (INDEPENDENT_AMBULATORY_CARE_PROVIDER_SITE_OTHER)
Admission: EM | Admit: 2012-04-27 | Discharge: 2012-04-27 | Disposition: A | Discharge status: Home / Self Care | Payer: Medicaid Other | Source: Home / Self Care | Attending: Emergency Medicine | Admitting: Emergency Medicine

## 2012-04-27 ENCOUNTER — Emergency Department (INDEPENDENT_AMBULATORY_CARE_PROVIDER_SITE_OTHER): Payer: Medicare Other

## 2012-04-27 ENCOUNTER — Encounter (HOSPITAL_COMMUNITY): Payer: Self-pay | Admitting: Emergency Medicine

## 2012-04-27 DIAGNOSIS — R059 Cough, unspecified: Secondary | ICD-10-CM | POA: Diagnosis not present

## 2012-04-27 DIAGNOSIS — J40 Bronchitis, not specified as acute or chronic: Secondary | ICD-10-CM

## 2012-04-27 DIAGNOSIS — H35369 Drusen (degenerative) of macula, unspecified eye: Secondary | ICD-10-CM | POA: Diagnosis not present

## 2012-04-27 DIAGNOSIS — H2510 Age-related nuclear cataract, unspecified eye: Secondary | ICD-10-CM | POA: Diagnosis not present

## 2012-04-27 DIAGNOSIS — R05 Cough: Secondary | ICD-10-CM | POA: Diagnosis not present

## 2012-04-27 MED ORDER — HYDROCODONE-HOMATROPINE 5-1.5 MG/5ML PO SYRP: 5.0000 mL | ORAL_SOLUTION | Freq: Four times a day (QID) | ORAL | Status: DC | PRN

## 2012-04-27 MED ORDER — AZITHROMYCIN 250 MG PO TABS: 250.0000 mg | ORAL_TABLET | Freq: Every day | ORAL | Status: DC

## 2012-04-27 NOTE — ED Notes (Signed)
Pt c/o cough x1 week  Cough is productive w/yellow mucous, chest/nasal congestion, runny nose, vomiting due to cough, occasional SOB due to cough Denies: f/n/d, wheezing Taking OTC allergy meds and cold meds w/little relief  She is alert w/no signs of acute respiratory distress.

## 2012-04-27 NOTE — ED Provider Notes (Signed)
History     CSN: [id number]  Arrival date & time 04/27/12  1646   First MD Initiated Contact with Patient 04/27/12 1720      Chief Complaint  Patient presents with  . Cough    (Consider location/radiation/quality/duration/timing/severity/associated sxs/prior treatment) Patient is a 67 y.o. female presenting with cough. The history is provided by the patient. No language interpreter was used.  Cough Cough characteristics:  Productive Sputum characteristics:  Green Severity:  Moderate Onset quality:  Sudden Duration:  1 week Timing:  Constant Progression:  Worsening Smoker: no   Relieved by:  Nothing Worsened by:  Deep breathing Ineffective treatments:  None tried Pt complains of a cough and congestion.  Pt reports hard cough.  Pt reports she can't hold back cough  Past Medical History  Diagnosis Date  . Hypertension   . Insomnia   . Vertigo   . Migraine   . Thyroid nodule 2010    TSH 04/2008 = 0.296, no follow up noted in [facility]  . HLD (hyperlipidemia)   . Psychosis     followed by Dr. Harrison Mons [phone]  . Acid reflux   . Back pain     Past Surgical History  Procedure Laterality Date  . Uterine fibroid surgery    . Tonsillectomy      Family History  Problem Relation Age of Onset  . Stroke Neg Hx   . Heart disease Neg Hx   . Cancer Neg Hx     History  Substance Use Topics  . Smoking status: Never Smoker   . Smokeless tobacco: Not on file  . Alcohol Use: No    OB History   Grav Para Term Preterm Abortions TAB SAB Ect Mult Living                  Review of Systems  Respiratory: Positive for cough.   All other systems reviewed and are negative.    Allergies  Lidocaine and Olanzapine  Home Medications   Current Outpatient Rx  Name  Route  Sig  Dispense  Refill  . amLODipine (NORVASC) 5 MG tablet   Oral   Take 1 tablet (5 mg total) by mouth daily.   30 tablet   4   . citalopram (CELEXA) 40 MG tablet   Oral   Take 1.5 tablets  (60 mg total) by mouth daily.   45 tablet   3   . simvastatin (ZOCOR) 20 MG tablet   Oral   Take 1 tablet (20 mg total) by mouth at bedtime.   30 tablet   3   . calcium carbonate (OS-CAL - DOSED IN MG OF ELEMENTAL CALCIUM) 1250 MG tablet   Oral   Take 1 tablet by mouth daily.         . clonazePAM (KLONOPIN) 0.5 MG tablet   Oral   Take 0.5 mg by mouth 2 (two) times daily as needed.         Marland Kitchen ibuprofen (ADVIL,MOTRIN) 800 MG tablet   Oral   Take 1 tablet (800 mg total) by mouth 3 (three) times daily as needed.   30 tablet   2   . Multiple Vitamin (MULTIVITAMIN WITH MINERALS) TABS   Oral   Take 1 tablet by mouth daily.         Marland Kitchen oxyCODONE-acetaminophen (PERCOCET) 7.5-325 MG per tablet   Oral   Take 1 tablet by mouth every 4 (four) hours as needed for pain. Use every 4-6 hours as needed  for pain.   60 tablet   0   . pregabalin (LYRICA) 75 MG capsule   Oral   Take 1 capsule (75 mg total) by mouth 2 (two) times daily.   90 capsule   2   . quinapril (ACCUPRIL) 5 MG tablet   Oral   Take 1 tablet (5 mg total) by mouth daily.   30 tablet   3   . ranitidine (ZANTAC) 150 MG tablet   Oral   Take 1 tablet (150 mg total) by mouth 2 (two) times daily.   60 tablet   3     BP 156/87  Pulse 91  Temp(Src) 98.7 F (37.1 C) (Oral)  Resp 24  SpO2 98%  LMP 03/27/1984  Physical Exam  Nursing note and vitals reviewed. Constitutional: She is oriented to person, place, and time. She appears well-developed and well-nourished.  HENT:  Head: Normocephalic.  Right Ear: External ear normal.  Left Ear: External ear normal.  Nose: Nose normal.  Mouth/Throat: Oropharynx is clear and moist.  Eyes: Conjunctivae are normal. Pupils are equal, round, and reactive to light.  Neck: Normal range of motion. Neck supple.  Cardiovascular: Normal rate.   Pulmonary/Chest: Effort normal.  Abdominal: Soft. Bowel sounds are normal.  Musculoskeletal: Normal range of motion.   Neurological: She is alert and oriented to person, place, and time.  Skin: Skin is warm.  Psychiatric: She has a normal mood and affect.    ED Course  Procedures (including critical care time)  Labs Reviewed - No data to display Dg Chest 2 View  04/27/2012  *RADIOLOGY REPORT*  Clinical Data: Cough, congestion  CHEST - 2 VIEW  Comparison: 03/24/2009  Findings: Normal heart size and vascularity.  Medial right lower lobe calcified granuloma noted.  No superimposed pneumonia, collapse, consolidation, edema, effusion or pneumothorax.  Right apical pleural scarring evident.  IMPRESSION: Remote granulomatous disease.  No acute process  Right apical scarring   Original Report Authenticated By: Judie Petit. Shick, M.D.      1. Bronchitis       MDM  Pt given rx for zithromax and hycodan.   I advised follow up with primary Md for recheck in 3-4 days        Elson Areas, [place] 04/27/12 9604

## 2012-04-28 DIAGNOSIS — Z1231 Encounter for screening mammogram for malignant neoplasm of breast: Secondary | ICD-10-CM | POA: Diagnosis not present

## 2012-04-28 NOTE — ED Provider Notes (Signed)
Medical screening examination/treatment/procedure(s) were performed by non-physician practitioner and as supervising physician I was immediately available for consultation/collaboration.  Leslee Home, M.D.  Reuben Likes, MD 04/28/12 [phone]

## 2012-05-01 ENCOUNTER — Encounter: Payer: Self-pay | Admitting: Gastroenterology

## 2012-05-01 ENCOUNTER — Ambulatory Visit (INDEPENDENT_AMBULATORY_CARE_PROVIDER_SITE_OTHER): Payer: Medicare Other | Admitting: Internal Medicine

## 2012-05-01 ENCOUNTER — Encounter: Payer: Self-pay | Admitting: Internal Medicine

## 2012-05-01 VITALS — BP 139/86 | HR 80 | Temp 98.2°F | Resp 20 | Ht 64.5 in | Wt 154.8 lb

## 2012-05-01 DIAGNOSIS — A150 Tuberculosis of lung: Secondary | ICD-10-CM

## 2012-05-01 DIAGNOSIS — Z1211 Encounter for screening for malignant neoplasm of colon: Secondary | ICD-10-CM

## 2012-05-01 DIAGNOSIS — J209 Acute bronchitis, unspecified: Secondary | ICD-10-CM

## 2012-05-01 DIAGNOSIS — Z0000 Encounter for general adult medical examination without abnormal findings: Secondary | ICD-10-CM | POA: Diagnosis not present

## 2012-05-01 DIAGNOSIS — Z227 Latent tuberculosis: Secondary | ICD-10-CM | POA: Insufficient documentation

## 2012-05-01 DIAGNOSIS — I10 Essential (primary) hypertension: Secondary | ICD-10-CM

## 2012-05-01 NOTE — Assessment & Plan Note (Signed)
BP Readings from Last 3 Encounters:  05/01/12 139/86  04/27/12 156/87  03/01/12 129/78   Lab Results  Component Value Date   NA 139 12/09/2011   K 4.2 12/09/2011   CREATININE 0.64 12/09/2011    Assessment:  Blood pressure control: controlled  Progress toward BP goal:  at goal  Plan:  Medications:  continue current medications on norvasc 5mg , accupril 5mg  daily  Educational resources provided: handout  Self management tools provided: home blood pressure logbook

## 2012-05-01 NOTE — Progress Notes (Signed)
Subjective:   Patient ID: Doris [name] female   DOB: Aug 03, 1945 67 y.o.   MRN: [id number]  HPI: [name] is a 67 y.o. female with PMH of HTN, subclincial hypothyroidism, and HLD presenting to clinic today for urgent care follow up.  She was seen in urgent care on 04/27/12 and treated for acute bronchitis with zithromax and hycodan.  Since starting treatment, she claims to have much improved cough, less in frequency and also less productive.  She did request to review her cxr which did show remote granulomatous disease noted in medial right lower lobe that was not present in prior cxr 2011.   She is a non-smoker.    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  She said it was not looked into any further but she does recall that to happen.  i have been unable to find any supporting documentation of this at this time.  Based on PPD hx and current cxr findings, concern for ?latent TB--will get quantiferon assay today and discuss with ID.  She denies any weight loss, fever, hemoptysis.  Does report occasional chills and sweating at night time but also says she has been having that chronically and due to her thyroid issues.    Otherwise, she claims to be doing okay, has mild sob with exertion since she has not been exercising much after her L arm fracture 2013.  She denies any chest pain, abdominal pain, or any urinary complaints at this time.  She continues to have a productive cough today but she says much improved than last week when she had to go to urgent care.  Past Medical History  Diagnosis Date  . Hypertension   . Insomnia   . Vertigo   . Migraine   . Thyroid nodule 2010    TSH 04/2008 = 0.296, no follow up noted in [facility]  . HLD (hyperlipidemia)   . Psychosis     followed by Dr. Harrison Mons [phone]  . Acid reflux   . Back pain    Current Outpatient Prescriptions   Medication Sig Dispense Refill  . amLODipine (NORVASC) 5 MG tablet Take 1 tablet (5 mg total) by mouth daily.  30 tablet  4  . azithromycin (ZITHROMAX) 250 MG tablet Take 1 tablet (250 mg total) by mouth daily. Take first 2 tablets together, then 1 every day until finished.  6 tablet  0  . calcium carbonate (OS-CAL - DOSED IN MG OF ELEMENTAL CALCIUM) 1250 MG tablet Take 1 tablet by mouth daily.      . citalopram (CELEXA) 40 MG tablet Take 1.5 tablets (60 mg total) by mouth daily.  45 tablet  3  . clonazePAM (KLONOPIN) 0.5 MG tablet Take 0.5 mg by mouth 2 (two) times daily as needed.      Marland Kitchen HYDROcodone-homatropine (HYCODAN) 5-1.5 MG/5ML syrup Take 5 mLs by mouth every 6 (six) hours as needed for cough.  120 mL  0  . ibuprofen (ADVIL,MOTRIN) 800 MG tablet Take 1 tablet (800 mg total) by mouth 3 (three) times daily as needed.  30 tablet  2  . Multiple Vitamin (MULTIVITAMIN WITH MINERALS) TABS Take 1 tablet by mouth daily.      Marland Kitchen oxyCODONE-acetaminophen (PERCOCET) 7.5-325 MG per tablet Take 1 tablet by mouth every 4 (four) hours as needed for pain. Use every 4-6 hours as needed for pain.  60 tablet  0  . pregabalin (LYRICA) 75 MG capsule Take 1 capsule (75 mg total) by mouth 2 (two) times daily.  90 capsule  2  . quinapril (ACCUPRIL) 5 MG tablet Take 1 tablet (5 mg total) by mouth daily.  30 tablet  3  . ranitidine (ZANTAC) 150 MG tablet Take 1 tablet (150 mg total) by mouth 2 (two) times daily.  60 tablet  3  . simvastatin (ZOCOR) 20 MG tablet Take 1 tablet (20 mg total) by mouth at bedtime.  30 tablet  3   No current facility-administered medications for this visit.   Family History  Problem Relation Age of Onset  . Stroke Neg Hx   . Heart disease Neg Hx   . Cancer Neg Hx    History   Social History  . Marital Status: Divorced    Spouse Name: N/A    Number of Children: N/A  . Years of Education: N/A   Social History Main Topics  . Smoking status: Never Smoker   . Smokeless tobacco:  Not on file  . Alcohol Use: No  . Drug Use: No  . Sexually Active: Not on file   Other Topics Concern  . Not on file   Social History Narrative  . No narrative on file   Review of Systems: Constitutional: occasional chills.  Denies fever, diaphoresis, appetite change and fatigue.  HEENT: Denies photophobia, eye pain, redness, hearing loss, ear pain, congestion, sore throat, rhinorrhea, sneezing, mouth sores, trouble swallowing, neck pain, neck stiffness and tinnitus.  Denies hemoptysis.   Respiratory: productive cough and sob with exertion.  Denies SOB, chest tightness,  and wheezing.   Cardiovascular: Denies chest pain, palpitations and leg swelling.  Gastrointestinal: Denies nausea, vomiting, abdominal pain, diarrhea, constipation, blood in stool and abdominal distention.  Genitourinary: Denies dysuria, urgency, frequency, hematuria, flank pain and difficulty urinating.  Musculoskeletal: Denies myalgias, back pain, joint swelling, arthralgias and gait problem.  Skin: Denies pallor, rash and wound.  Neurological: Denies dizziness, seizures, syncope, weakness, light-headedness, numbness and headaches.  Hematological: Denies adenopathy. Easy bruising, personal or family bleeding history  Psychiatric/Behavioral: Denies suicidal ideation, mood changes, confusion, nervousness, sleep disturbance and agitation  Objective:  Physical Exam: Filed Vitals:   05/01/12 0831  BP: 139/86  Pulse: 80  Temp: 98.2 F (36.8 C)  TempSrc: Oral  Resp: 20  Height: 5' 4.5" (1.638 m)  Weight: 154 lb 12.8 oz (70.217 kg)  SpO2: 97%   Constitutional: Vital signs reviewed.  Patient is a well-developed and well-nourished female in no acute distress and cooperative with exam. Alert and oriented x3.  Head: Normocephalic and atraumatic Ear: TM normal bilaterally Mouth: no erythema or exudates, MMM Eyes: PERRL, EOMI, conjunctivae normal, No scleral icterus.  Neck: Supple, Trachea midline normal ROM   Cardiovascular: RRR, S1 normal, S2 normal, no MRG, pulses symmetric and intact bilaterally Pulmonary/Chest: CTAB, no wheezes, rales, or rhonchi Abdominal: Soft. Non-tender, non-distended, bowel sounds are normal, no masses, organomegaly, or guarding present.  GU: no CVA tenderness Musculoskeletal: No joint deformities, erythema, or stiffness, ROM full and nontender Hematology: no cervical, inginal, or axillary adenopathy.  Neurological: A&O x3, Strength is normal and symmetric bilaterally, cranial nerve II-XII are grossly intact, no focal motor deficit, sensory intact to light touch bilaterally.  Skin: Warm, dry and intact. No rash, cyanosis, or clubbing.  Psychiatric: Normal mood and affect. speech and behavior is normal. Judgment and thought content normal. Cognition and memory are normal.   Assessment & Plan:  Discussed with  Dr. Aundria Rud  -?latent TB: new granulomatous findings on cxr with hx of ?positive ppd 5 years ago.  Discussed with Dr. Drue Second from ID--agreed with quantiferon and re-evaluated based on results.  -bronchitis--resolving.  On zithromax.

## 2012-05-01 NOTE — Assessment & Plan Note (Signed)
Treated with zithromax and hycodan syrup at urgent care visit on 04/27/12.  cxr showed Remote granulomatous disease. Cough improving per patient, mild productive sputum white-yellow.  Denies hemoptysis.  Baseline SOB on exertion, especially since she has not been exercising lately she claims.  Denies any chest pain.  Afebrile.  Occasional chills.    -complete course of zithromax -continue to monitor

## 2012-05-01 NOTE — Assessment & Plan Note (Addendum)
?  Latent TB given history of ppd reaction, new cxr finding, and working in healthcare setting.  Denies weightloss or hemoptysis.  Occasional sweating at night but says she has been having that since her thyroid problems.    -quantiferon assay today -discussed with ID on recommendations with Dr. Drue Second this morning: agreed with quantiferon assay and awaiting results--if negative likely may not need treatment.  Any concern for sarcoidosis? Non-smoker, no hypercalcemia noted in history or labs.   -will notify pcp as well

## 2012-05-01 NOTE — Assessment & Plan Note (Signed)
Colonoscopy referral made today  -defer tdap and zostavax for next visit with pcp

## 2012-05-01 NOTE — Patient Instructions (Addendum)
We will get a quantiferon TB test today just in case given the history you have provided and your cxr  I will also consult Infectious Disease clinic and get back to you in regards to their recommendations  We will refer you for colonoscopy today  Please complete your course of antibiotics as prescribed

## 2012-05-04 LAB — QUANTIFERON TB GOLD ASSAY (BLOOD)
Interferon Gamma Release Assay: NEGATIVE
Quantiferon Tb Ag Minus Nil Value: 0 IU/mL

## 2012-05-23 ENCOUNTER — Other Ambulatory Visit: Payer: Self-pay | Admitting: *Deleted

## 2012-05-23 DIAGNOSIS — E785 Hyperlipidemia, unspecified: Secondary | ICD-10-CM

## 2012-05-24 ENCOUNTER — Ambulatory Visit (AMBULATORY_SURGERY_CENTER): Payer: Medicare Other | Admitting: *Deleted

## 2012-05-24 VITALS — Ht 65.0 in | Wt 154.0 lb

## 2012-05-24 DIAGNOSIS — Z1211 Encounter for screening for malignant neoplasm of colon: Secondary | ICD-10-CM

## 2012-05-24 MED ORDER — MOVIPREP 100 G PO SOLR: ORAL | Status: DC

## 2012-05-24 MED ORDER — SIMVASTATIN 20 MG PO TABS: 20.0000 mg | ORAL_TABLET | Freq: Every day | ORAL | Status: DC

## 2012-06-07 ENCOUNTER — Encounter: Payer: Self-pay | Admitting: Gastroenterology

## 2012-06-07 ENCOUNTER — Ambulatory Visit (AMBULATORY_SURGERY_CENTER): Payer: Medicare Other | Admitting: Gastroenterology

## 2012-06-07 VITALS — BP 137/92 | HR 62 | Temp 98.6°F | Resp 14 | Ht 65.0 in | Wt 154.0 lb

## 2012-06-07 DIAGNOSIS — F329 Major depressive disorder, single episode, unspecified: Secondary | ICD-10-CM | POA: Diagnosis not present

## 2012-06-07 DIAGNOSIS — Z1211 Encounter for screening for malignant neoplasm of colon: Secondary | ICD-10-CM | POA: Diagnosis not present

## 2012-06-07 DIAGNOSIS — F3289 Other specified depressive episodes: Secondary | ICD-10-CM | POA: Diagnosis not present

## 2012-06-07 DIAGNOSIS — D126 Benign neoplasm of colon, unspecified: Secondary | ICD-10-CM

## 2012-06-07 DIAGNOSIS — I10 Essential (primary) hypertension: Secondary | ICD-10-CM | POA: Diagnosis not present

## 2012-06-07 DIAGNOSIS — F29 Unspecified psychosis not due to a substance or known physiological condition: Secondary | ICD-10-CM | POA: Diagnosis not present

## 2012-06-07 MED ORDER — SODIUM CHLORIDE 0.9 % IV SOLN: 500.0000 mL | INTRAVENOUS | Status: DC

## 2012-06-07 NOTE — Progress Notes (Signed)
Patient did not experience any of the following events: a burn prior to discharge; a fall within the facility; wrong site/side/patient/procedure/implant event; or a hospital transfer or hospital admission upon discharge from the facility. (G8907) Patient did not have preoperative order for IV antibiotic SSI prophylaxis. (G8918)  

## 2012-06-07 NOTE — Progress Notes (Signed)
Stable to RR 

## 2012-06-07 NOTE — Patient Instructions (Addendum)

## 2012-06-07 NOTE — Op Note (Signed)
[facility] [address] [place], [address]   COLONOSCOPY PROCEDURE REPORT  PATIENT: Doris [name], Doris [name]  MR#: [id number] BIRTHDATE: 1945/08/06 , 66  yrs. old GENDER: Female ENDOSCOPIST: Meryl Dare, MD, [facility] REFERRED BY:  Darden Palmer, MD PROCEDURE DATE:  06/07/2012 PROCEDURE:   Colonoscopy with biopsy and snare polypectomy ASA CLASS:   Class II INDICATIONS:average risk screening. MEDICATIONS: MAC sedation, administered by CRNA and propofol (Diprivan) 200mg  IV DESCRIPTION OF PROCEDURE:   After the risks benefits and alternatives of the procedure were thoroughly explained, informed consent was obtained.  A digital rectal exam revealed no abnormalities of the rectum.   The LB [id number]  endoscope was introduced through the anus and advanced to the cecum, which was identified by both the appendix and ileocecal valve. No adverse events experienced.   The quality of the prep was good, using MoviPrep  The instrument was then slowly withdrawn as the colon was fully examined.  COLON FINDINGS: A semi-pedunculated polyp measuring 6 mm in size was found in the sigmoid colon.  A polypectomy was performed with a cold snare.  The resection was complete and the polyp tissue was completely retrieved.   A sessile polyp measuring 4 mm in size was found in the distal sigmoid colon.  A polypectomy was performed with cold forceps.  The resection was complete and the polyp tissue was completely retrieved.   The colon was otherwise normal.  There was no diverticulosis, inflammation, polyps or cancers unless previously stated.  Retroflexed views revealed no abnormalities. The time to cecum=1 minutes 35 seconds.  Withdrawal time=11 minutes 31 seconds.  The scope was withdrawn and the procedure completed. COMPLICATIONS: There were no complications.  ENDOSCOPIC IMPRESSION: 1.   Semi-pedunculated polyp measuring 6 mm in the sigmoid colon; polypectomy performed with a cold  snare 2.   Sessile polyp measuring 4 mm in the distal sigmoid colon; polypectomy performed with cold forceps 3.   The colon was otherwise normal  RECOMMENDATIONS: 1.  Await pathology results 2.  Repeat colonoscopy in 5 years if polyp(s) adenomatous; otherwise 10 years   eSigned:  Meryl Dare, MD, [facility] 06/07/2012 1:42 PM

## 2012-06-08 ENCOUNTER — Telehealth: Payer: Self-pay | Admitting: *Deleted

## 2012-06-08 NOTE — Telephone Encounter (Signed)
  Follow up Call-  Call back number 06/07/2012  Post procedure Call Back phone  # [phone]  Permission to leave phone message Yes     Patient questions:  Do you have a fever, pain , or abdominal swelling? no Pain Score  0 *  Have you tolerated food without any problems? yes  Have you been able to return to your normal activities? yes  Do you have any questions about your discharge instructions: Diet   no Medications  no Follow up visit  no  Do you have questions or concerns about your Care? no  Actions: * If pain score is 4 or above: No action needed, pain <4.

## 2012-06-13 ENCOUNTER — Encounter: Payer: Self-pay | Admitting: Gastroenterology

## 2012-06-14 DIAGNOSIS — F330 Major depressive disorder, recurrent, mild: Secondary | ICD-10-CM | POA: Diagnosis not present

## 2012-06-14 DIAGNOSIS — F411 Generalized anxiety disorder: Secondary | ICD-10-CM | POA: Diagnosis not present

## 2012-07-13 ENCOUNTER — Telehealth: Payer: Self-pay | Admitting: Gastroenterology

## 2012-07-13 DIAGNOSIS — F411 Generalized anxiety disorder: Secondary | ICD-10-CM | POA: Diagnosis not present

## 2012-07-13 DIAGNOSIS — F330 Major depressive disorder, recurrent, mild: Secondary | ICD-10-CM | POA: Diagnosis not present

## 2012-07-13 NOTE — Telephone Encounter (Signed)
I have reviewed the path report and procedure report with the patient all questions answered.  She will call back for any additional questions or concerns

## 2012-09-12 DIAGNOSIS — F330 Major depressive disorder, recurrent, mild: Secondary | ICD-10-CM | POA: Diagnosis not present

## 2012-09-12 DIAGNOSIS — F411 Generalized anxiety disorder: Secondary | ICD-10-CM | POA: Diagnosis not present

## 2012-09-13 ENCOUNTER — Other Ambulatory Visit: Payer: Self-pay | Admitting: Internal Medicine

## 2012-09-13 DIAGNOSIS — E0590 Thyrotoxicosis, unspecified without thyrotoxic crisis or storm: Secondary | ICD-10-CM | POA: Diagnosis not present

## 2012-09-14 ENCOUNTER — Other Ambulatory Visit: Payer: Self-pay | Admitting: *Deleted

## 2012-09-14 DIAGNOSIS — I10 Essential (primary) hypertension: Secondary | ICD-10-CM

## 2012-09-14 DIAGNOSIS — K219 Gastro-esophageal reflux disease without esophagitis: Secondary | ICD-10-CM

## 2012-09-14 MED ORDER — AMLODIPINE BESYLATE 5 MG PO TABS: 5.0000 mg | ORAL_TABLET | Freq: Every day | ORAL | Status: DC

## 2012-09-14 MED ORDER — RANITIDINE HCL 150 MG PO TABS: 150.0000 mg | ORAL_TABLET | Freq: Two times a day (BID) | ORAL | Status: DC

## 2012-09-29 ENCOUNTER — Ambulatory Visit (INDEPENDENT_AMBULATORY_CARE_PROVIDER_SITE_OTHER): Payer: Medicare Other | Admitting: Internal Medicine

## 2012-09-29 ENCOUNTER — Encounter: Payer: Self-pay | Admitting: Internal Medicine

## 2012-09-29 ENCOUNTER — Other Ambulatory Visit: Payer: Self-pay | Admitting: *Deleted

## 2012-09-29 VITALS — BP 139/83 | HR 81 | Temp 99.5°F | Ht 63.0 in | Wt 155.4 lb

## 2012-09-29 DIAGNOSIS — R0602 Shortness of breath: Secondary | ICD-10-CM | POA: Diagnosis not present

## 2012-09-29 DIAGNOSIS — I10 Essential (primary) hypertension: Secondary | ICD-10-CM

## 2012-09-29 MED ORDER — QUINAPRIL HCL 5 MG PO TABS: 5.0000 mg | ORAL_TABLET | Freq: Every day | ORAL | Status: DC

## 2012-09-29 NOTE — Progress Notes (Signed)
Subjective:   Patient ID: Doris [name] female   DOB: Jan 14, 1946 67 y.o.   MRN: [id number]  HPI: Doris [name] is a 67 y.o.   Woman with a past medical history of thyroid nodule followed by Dr. Sharl Ma, psychosis followed by Dr. Harrison Mons, recent fracture in February of 2013, hypertension, and questionable granulomatous disease on previous chest x-ray on 04/2012 coming in for an acute visit regarding shortness of breath. Patient states that usually shortness of breath associated with exertion she does feel overly deconditioned since her fractures and multiple osteoarthritic pain that is semi-well-controlled. She does have a two-pillow orthopnea that is her baseline has been compliant with her blood pressure medications and was recently evaluated at Dr. Sharl Ma his office in regards to her thyroid disease and will be having a followup visit Dr. Sharl Ma "some new changes or very low readings." she does not have chest pain with shortness of breath, lower extremity edema, dizziness, loss of consciousness or presyncopal episodes. She does not have a personal history of previous heart attack. She is not a smoker and is not around smokers. Her episodes of shortness of breath or sometimes related to her anxiety and nothing really seems to exacerbate or relieve the symptoms. She was scheduled at some point to have a stress test but "had superhigh blood pressure readings and had to be hospitalized and therefore the test was canceled." The patient states that this was very remote. She does not have a very strong history of heart disease in her family.  Medications, past medical history, social history, and family history were all personally reviewed with the patient.    Past Medical History  Diagnosis Date  . Hypertension   . Insomnia   . Vertigo   . Migraine   . Thyroid nodule 2010    TSH 04/2008 = 0.296, no follow up noted in [facility]  . HLD (hyperlipidemia)   . Psychosis     followed by Dr. Harrison Mons [phone]   . Acid reflux   . Back pain   . Osteoarthritis     knee   Current Outpatient Prescriptions  Medication Sig Dispense Refill  . amLODipine (NORVASC) 5 MG tablet Take 1 tablet (5 mg total) by mouth daily.  30 tablet  4  . calcium carbonate (OS-CAL - DOSED IN MG OF ELEMENTAL CALCIUM) 1250 MG tablet Take 1 tablet by mouth daily.      . citalopram (CELEXA) 40 MG tablet Take 1.5 tablets (60 mg total) by mouth daily.  45 tablet  3  . clonazePAM (KLONOPIN) 0.5 MG tablet Take 0.5 mg by mouth 2 (two) times daily as needed.      Marland Kitchen ibuprofen (ADVIL,MOTRIN) 800 MG tablet Take 1 tablet (800 mg total) by mouth 3 (three) times daily as needed.  30 tablet  2  . Multiple Vitamin (MULTIVITAMIN WITH MINERALS) TABS Take 1 tablet by mouth daily.      . quinapril (ACCUPRIL) 5 MG tablet Take 1 tablet (5 mg total) by mouth daily.  30 tablet  3  . ranitidine (ZANTAC) 150 MG tablet Take 1 tablet (150 mg total) by mouth 2 (two) times daily.  60 tablet  3  . simvastatin (ZOCOR) 20 MG tablet TAKE 1 TABLET (20 MG TOTAL) BY MOUTH AT BEDTIME.  30 tablet  2   No current facility-administered medications for this visit.   Family History  Problem Relation Age of Onset  . Stroke Neg Hx   . Heart disease Neg Hx   .  Cancer Neg Hx   . Colon cancer Neg Hx    History   Social History  . Marital Status: Divorced    Spouse Name: N/A    Number of Children: N/A  . Years of Education: N/A   Social History Main Topics  . Smoking status: Never Smoker   . Smokeless tobacco: Never Used  . Alcohol Use: No  . Drug Use: No  . Sexual Activity: No   Other Topics Concern  . None   Social History Narrative  . None   Review of Systems: Constitutional: Denies fever, chills, diaphoresis, appetite change and fatigue.  HEENT: Denies photophobia, eye pain, redness, hearing loss, ear pain, congestion, sore throat, rhinorrhea, sneezing, mouth sores, trouble swallowing, neck pain, neck stiffness and tinnitus.   Respiratory: Denies  SOB, DOE, cough, chest tightness,  and wheezing.   Cardiovascular: Denies chest pain, palpitations and leg swelling.  Gastrointestinal: Denies nausea, vomiting, abdominal pain, diarrhea, constipation, blood in stool and abdominal distention.  Genitourinary: Denies dysuria, urgency, frequency, hematuria, flank pain and difficulty urinating.  Endocrine: Denies: hot or cold intolerance, sweats, changes in hair or nails, polyuria, polydipsia. Musculoskeletal: Denies myalgias, back pain, joint swelling, arthralgias and gait problem.  Skin: Denies pallor, rash and wound.  Neurological: Denies dizziness, seizures, syncope, weakness, light-headedness, numbness and headaches.  Hematological: Denies adenopathy. Easy bruising, personal or family bleeding history  Psychiatric/Behavioral: Denies suicidal ideation, mood changes, confusion, nervousness, sleep disturbance and agitation  Objective:  Physical Exam: Filed Vitals:   09/29/12 1316  BP: 139/83  Pulse: 81  Temp: 99.5 F (37.5 C)  TempSrc: Oral  Height: 5\' 3"  (1.6 m)  Weight: 155 lb 6.4 oz (70.489 kg)  SpO2: 97%   Constitutional: Vital signs reviewed.  Patient is a well-developed and well-nourished in no acute distress and cooperative with exam. Alert and oriented x3.  Head: Normocephalic and atraumatic Neck: Supple, Trachea midline normal ROM, No JVD, mass, thyromegaly, or carotid bruit present.  Cardiovascular: RRR, S1 normal, S2 normal, no MRG, pulses symmetric and intact bilaterally Pulmonary/Chest: normal respiratory effort, CTAB, no wheezes, rales, or rhonchi Abdominal: Soft. Non-tender, non-distended, bowel sounds are normal, no masses, organomegaly, or guarding present.  Skin: Warm, dry and intact. No rash, cyanosis, or clubbing.  Psychiatric: pt slightly anxious with examination and during interview,   Assessment & Plan:   1. SOB: multifactorial. Pt has been severly deconditioned given recent fractures and multiple osteoarthritic  joints that continued to have pain. There is also questionable change in regards to her hyperthyroidism which can also cause some of the patient's symptoms. The patient also has very strong psych history including psychosis and anxiety even throughout the interview and physical exam patient seemed anxious and was able to be reassured in terms of her symptoms. -echo outpatient -continue to reassure patient and followup if symptoms get worse or is associated with chest pain, lower extremity edema or increased orthopnea. -pt refused albuterol inhaler for symptomatic management -f/u with Dr. Sharl Ma in regards to thyroid disease -TB test were reviewed with patient and were negativealso very unlikely that granulomatous disease or the found nodules on the 04/2012 chest x-ray would be causing patient's symptoms  Pt discussed with Dr. Aundria Rud

## 2012-10-03 ENCOUNTER — Ambulatory Visit (HOSPITAL_COMMUNITY): Payer: Medicare Other | Attending: Internal Medicine

## 2012-10-12 DIAGNOSIS — M25569 Pain in unspecified knee: Secondary | ICD-10-CM | POA: Diagnosis not present

## 2012-10-12 DIAGNOSIS — M25539 Pain in unspecified wrist: Secondary | ICD-10-CM | POA: Diagnosis not present

## 2012-10-12 DIAGNOSIS — S42209A Unspecified fracture of upper end of unspecified humerus, initial encounter for closed fracture: Secondary | ICD-10-CM | POA: Diagnosis not present

## 2012-10-27 ENCOUNTER — Encounter: Payer: Medicare Other | Admitting: Internal Medicine

## 2012-11-25 DIAGNOSIS — Z23 Encounter for immunization: Secondary | ICD-10-CM | POA: Diagnosis not present

## 2012-12-14 DIAGNOSIS — F330 Major depressive disorder, recurrent, mild: Secondary | ICD-10-CM | POA: Diagnosis not present

## 2012-12-14 DIAGNOSIS — F411 Generalized anxiety disorder: Secondary | ICD-10-CM | POA: Diagnosis not present

## 2012-12-20 ENCOUNTER — Emergency Department (HOSPITAL_COMMUNITY): Payer: Medicare Other

## 2012-12-20 ENCOUNTER — Encounter (HOSPITAL_COMMUNITY): Payer: Self-pay | Admitting: Emergency Medicine

## 2012-12-20 ENCOUNTER — Emergency Department (HOSPITAL_COMMUNITY)
Admission: EM | Admit: 2012-12-20 | Discharge: 2012-12-20 | Disposition: A | Discharge status: Home / Self Care | Payer: Medicare Other | Attending: Emergency Medicine | Admitting: Emergency Medicine

## 2012-12-20 DIAGNOSIS — IMO0002 Reserved for concepts with insufficient information to code with codable children: Secondary | ICD-10-CM | POA: Diagnosis not present

## 2012-12-20 DIAGNOSIS — R079 Chest pain, unspecified: Secondary | ICD-10-CM | POA: Insufficient documentation

## 2012-12-20 DIAGNOSIS — J8410 Pulmonary fibrosis, unspecified: Secondary | ICD-10-CM | POA: Diagnosis not present

## 2012-12-20 DIAGNOSIS — Z79899 Other long term (current) drug therapy: Secondary | ICD-10-CM | POA: Diagnosis not present

## 2012-12-20 DIAGNOSIS — M1710 Unilateral primary osteoarthritis, unspecified knee: Secondary | ICD-10-CM | POA: Insufficient documentation

## 2012-12-20 DIAGNOSIS — K219 Gastro-esophageal reflux disease without esophagitis: Secondary | ICD-10-CM | POA: Diagnosis not present

## 2012-12-20 DIAGNOSIS — F411 Generalized anxiety disorder: Secondary | ICD-10-CM | POA: Insufficient documentation

## 2012-12-20 DIAGNOSIS — I10 Essential (primary) hypertension: Secondary | ICD-10-CM | POA: Diagnosis not present

## 2012-12-20 DIAGNOSIS — E785 Hyperlipidemia, unspecified: Secondary | ICD-10-CM | POA: Insufficient documentation

## 2012-12-20 DIAGNOSIS — R0789 Other chest pain: Secondary | ICD-10-CM | POA: Diagnosis not present

## 2012-12-20 LAB — POCT I-STAT TROPONIN I: Troponin i, poc: 0 ng/mL (ref 0.00–0.08)

## 2012-12-20 LAB — BASIC METABOLIC PANEL
Calcium: 9.6 mg/dL (ref 8.4–10.5)
Chloride: 102 mEq/L (ref 96–112)
GFR calc non Af Amer: 89 mL/min — ABNORMAL LOW (ref >90)
Glucose, Bld: 123 mg/dL — ABNORMAL HIGH (ref 70–99)
Potassium: 3.4 mEq/L — ABNORMAL LOW (ref 3.5–5.1)
Sodium: 140 mEq/L (ref 135–145)

## 2012-12-20 LAB — CBC
Hemoglobin: 14 g/dL (ref 12.0–15.0)
MCH: 29.3 pg (ref 26.0–34.0)
MCHC: 33.9 g/dL (ref 30.0–36.0)
Platelets: 170 10*3/uL (ref 150–400)
RBC: 4.78 MIL/uL (ref 3.87–5.11)
WBC: 9.4 10*3/uL (ref 4.0–10.5)

## 2012-12-20 NOTE — ED Notes (Signed)
Pt. reports left chest pain onset this evening radiating to left shoulder / left face and left arm . Denies SOB , nausea or diaphoresis .

## 2012-12-20 NOTE — ED Provider Notes (Signed)
CSN: [id number]     Arrival date & time 12/20/12  1909 History   First MD Initiated Contact with Patient 12/20/12 1958     Chief Complaint  Patient presents with  . Chest Pain   (Consider location/radiation/quality/duration/timing/severity/associated sxs/prior Treatment) HPI Comments: Doris [name] is a 67 y.o. female who complains of intermittent sensation of chest pressure radiating to jaw and left arm for one day. She has had 2 episodes. She has been using a combination of her usual medicines, including Ativan, Restoril, Norvasc, aspirin, and antacids with success in reducing the pain. The first episode resolved last night after she went to sleep. The second episode occurred today after work, today, at 5 PM,  and has resolved since arriving in the emergency department. She denies associated fever, chills, nausea, vomiting, diaphoresis, weakness, or dizziness. She denies cough or shortness of breath. She is somewhat concerned about an abnormal chest x-ray that was done in March 2014. She is a remote history of stress test that was incomplete. She believes that she has an enlarged heart. She follows with her PCP, regularly. There are no other known modifying factors.  Patient is a 67 y.o. female presenting with chest pain. The history is provided by the patient.  Chest Pain   Past Medical History  Diagnosis Date  . Hypertension   . Insomnia   . Vertigo   . Migraine   . Thyroid nodule 2010    TSH 04/2008 = 0.296, no follow up noted in [facility]  . HLD (hyperlipidemia)   . Psychosis     followed by Dr. Harrison Mons [phone]  . Acid reflux   . Back pain   . Osteoarthritis     knee   Past Surgical History  Procedure Laterality Date  . Uterine fibroid surgery    . Tonsillectomy     Family History  Problem Relation Age of Onset  . Stroke Neg Hx   . Heart disease Neg Hx   . Cancer Neg Hx   . Colon cancer Neg Hx    History  Substance Use Topics  . Smoking status: Never Smoker   .  Smokeless tobacco: Never Used  . Alcohol Use: No   OB History   Grav Para Term Preterm Abortions TAB SAB Ect Mult Living                 Review of Systems  Cardiovascular: Positive for chest pain.  All other systems reviewed and are negative.    Allergies  Lidocaine and Olanzapine  Home Medications   Current Outpatient Rx  Name  Route  Sig  Dispense  Refill  . acetaminophen-codeine (TYLENOL #3) 300-30 MG per tablet   Oral   Take 1 tablet by mouth every 4 (four) hours as needed for moderate pain.         Marland Kitchen amLODipine (NORVASC) 5 MG tablet   Oral   Take 1 tablet (5 mg total) by mouth daily.   30 tablet   4   . BIOTIN PO   Oral   Take 1 tablet by mouth daily.         . calcium carbonate (OS-CAL - DOSED IN MG OF ELEMENTAL CALCIUM) 1250 MG tablet   Oral   Take 1 tablet by mouth daily.         . citalopram (CELEXA) 40 MG tablet   Oral   Take 1.5 tablets (60 mg total) by mouth daily.   45 tablet   3   .  LORazepam (ATIVAN) 1 MG tablet   Oral   Take 1 mg by mouth every 8 (eight) hours. For pain per patient         . Multiple Vitamin (MULTIVITAMIN WITH MINERALS) TABS   Oral   Take 1 tablet by mouth daily.         Marland Kitchen omeprazole (PRILOSEC) 20 MG capsule   Oral   Take 20 mg by mouth daily.         . quinapril (ACCUPRIL) 5 MG tablet   Oral   Take 5 mg by mouth daily.         . ranitidine (ZANTAC) 150 MG tablet   Oral   Take 1 tablet (150 mg total) by mouth 2 (two) times daily.   60 tablet   3   . simvastatin (ZOCOR) 20 MG tablet      TAKE 1 TABLET (20 MG TOTAL) BY MOUTH AT BEDTIME.   30 tablet   2   . temazepam (RESTORIL) 7.5 MG capsule   Oral   Take 7.5 mg by mouth at bedtime as needed for sleep.          BP 134/77  Pulse 80  Temp(Src) 98.7 F (37.1 C) (Oral)  Resp 17  SpO2 99%  LMP 03/27/1984 Physical Exam  Nursing note and vitals reviewed. Constitutional: She is oriented to person, place, and time. She appears well-developed  and well-nourished.  HENT:  Head: Normocephalic and atraumatic.  Eyes: Conjunctivae and EOM are normal. Pupils are equal, round, and reactive to light.  Neck: Normal range of motion and phonation normal. Neck supple.  Cardiovascular: Normal rate, regular rhythm and intact distal pulses.   Pulmonary/Chest: Effort normal and breath sounds normal. She exhibits no tenderness.  Abdominal: Soft. She exhibits no distension. There is no tenderness. There is no guarding.  Musculoskeletal: Normal range of motion.  Neurological: She is alert and oriented to person, place, and time. She exhibits normal muscle tone.  Skin: Skin is warm and dry.  Psychiatric: Her behavior is normal. Judgment and thought content normal.  Anxious    ED Course  Procedures (including critical care time)   22:05- I offered the patient further testing. She decided that since she had no pain now; she was comfortable and wanted to go home.       Date: 12/20/12  Rate: 98  Rhythm: normal sinus rhythm  QRS Axis: normal  PR and QT Intervals: normal  ST/T Wave abnormalities: normal  PR and QRS Conduction Disutrbances:none  Narrative Interpretation:   Old EKG Reviewed: unchanged    Labs Review Labs Reviewed  BASIC METABOLIC PANEL - Abnormal; Notable for the following:    Potassium 3.4 (*)    Glucose, Bld 123 (*)    GFR calc non Af Amer 89 (*)    All other components within normal limits  CBC  POCT I-STAT TROPONIN I   Imaging Review Dg Chest 2 View  12/20/2012   CLINICAL DATA:  Left chest pain  EXAM: CHEST  2 VIEW  COMPARISON:  04/27/2012  FINDINGS: Normal heart size and vascularity. Minor right base scarring versus atelectasis. Negative for CHF, pneumonia, collapse or consolidation. No effusion or pneumothorax. Trachea midline. Medial right base calcified granuloma again evident.  IMPRESSION: Stable exam.  No acute process.   Electronically Signed   By: Ruel Favors M.D.   On: 12/20/2012 20:27    EKG  Interpretation   None       MDM  1. Chest pain      Nonspecific chest pain, with very low risk for cardiac disease and unlikely clinical scenario for ACS. More likely etiologies include anxiety related chest pain or GERD. She is stable for discharge with outpatient management.   Nursing Notes Reviewed/ Care Coordinated, and agree without changes. Applicable Imaging Reviewed.  Interpretation of Laboratory Data incorporated into ED treatment   Plan: Home Medications- usual; Home Treatments and Observation. Rest; return here if the recommended treatment, does not improve the symptoms; Recommended follow up- PCP followup, one week     Flint Melter, MD 12/21/12 [phone]

## 2012-12-21 ENCOUNTER — Other Ambulatory Visit: Payer: Self-pay

## 2013-01-02 ENCOUNTER — Encounter: Payer: Self-pay | Admitting: Internal Medicine

## 2013-01-02 ENCOUNTER — Ambulatory Visit (INDEPENDENT_AMBULATORY_CARE_PROVIDER_SITE_OTHER): Payer: Medicare Other | Admitting: Internal Medicine

## 2013-01-02 VITALS — BP 158/75 | HR 95 | Temp 98.2°F | Wt 156.9 lb

## 2013-01-02 DIAGNOSIS — M79609 Pain in unspecified limb: Secondary | ICD-10-CM

## 2013-01-02 DIAGNOSIS — I10 Essential (primary) hypertension: Secondary | ICD-10-CM

## 2013-01-02 DIAGNOSIS — M79641 Pain in right hand: Secondary | ICD-10-CM

## 2013-01-02 DIAGNOSIS — K219 Gastro-esophageal reflux disease without esophagitis: Secondary | ICD-10-CM | POA: Diagnosis not present

## 2013-01-02 DIAGNOSIS — G4700 Insomnia, unspecified: Secondary | ICD-10-CM

## 2013-01-02 DIAGNOSIS — F323 Major depressive disorder, single episode, severe with psychotic features: Secondary | ICD-10-CM | POA: Diagnosis not present

## 2013-01-02 MED ORDER — HYDROXYZINE PAMOATE 50 MG PO CAPS: 100.0000 mg | ORAL_CAPSULE | Freq: Every evening | ORAL | Status: DC | PRN

## 2013-01-02 MED ORDER — PANTOPRAZOLE SODIUM 40 MG PO TBEC: 40.0000 mg | DELAYED_RELEASE_TABLET | Freq: Every day | ORAL | Status: DC

## 2013-01-02 MED ORDER — ACETAMINOPHEN-CODEINE #3 300-30 MG PO TABS: 1.0000 | ORAL_TABLET | Freq: Four times a day (QID) | ORAL | Status: DC | PRN

## 2013-01-02 MED ORDER — AMLODIPINE BESYLATE 5 MG PO TABS: 5.0000 mg | ORAL_TABLET | Freq: Every day | ORAL | Status: DC

## 2013-01-02 NOTE — Assessment & Plan Note (Signed)
Requesting Vistaril.  Reports awaking at night and  Would like to take something other than Restoril or Benadryl to help return to sleep. She is not sure of what is preventing a good nights rest.  Does have some anxiety. Follow by Psychiatry. -Prescribed Vistaril 50 mg qhs prn anxiety, sedation

## 2013-01-02 NOTE — Assessment & Plan Note (Signed)
Will prescribe pantoprazole 40 mg qd.  Pt reports needing a prescription agent as the OTC prilosec is too costly.   -Consider referral to GI if still no release

## 2013-01-02 NOTE — Patient Instructions (Addendum)
General Instructions: The Tylenol should improve your hand pain. Continue to wear the brace if it helps, but no need for bandages. If it does not improve in 1 week, contact your PCP for referral to Physical Therapy or Sports Medicine. We have refilled your amlodipine. We have prescribed pantoprazole for your heartburn. As you requested, we prescribed Vistaril at night as needed for sleep.  Treatment Goals:  Goals (1 Years of Data) as of 01/02/13         As of Today 12/20/12 12/20/12 09/29/12 06/07/12     Blood Pressure    . Blood Pressure < 140/90  154/82 134/77 146/78 139/83 137/92     Result Component    . LDL CALC < 130            Progress Toward Treatment Goals:  Treatment Goal 01/02/2013  Blood pressure at goal    Self Care Goals & Plans:  Self Care Goal 09/29/2012  Manage my medications take my medicines as prescribed; bring my medications to every visit; refill my medications on time  Monitor my health -  Eat healthy foods drink diet soda or water instead of juice or soda; eat more vegetables; eat foods that are low in salt; eat baked foods instead of fried foods  Be physically active -  Other -    No flowsheet data found.   Care Management & Community Referrals:  Referral 05/01/2012  Referrals made for care management support none needed
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
## 2013-01-02 NOTE — Progress Notes (Signed)
Case discussed with Dr. Schooler soon after the resident saw the patient.  We reviewed the resident's history and exam and pertinent patient test results.  I agree with the assessment, diagnosis, and plan of care documented in the resident's note. 

## 2013-01-02 NOTE — Assessment & Plan Note (Signed)
BP Readings from Last 3 Encounters:  01/02/13 158/75  12/20/12 134/77  09/29/12 139/83    Lab Results  Component Value Date   NA 140 12/20/2012   K 3.4* 12/20/2012   CREATININE 0.67 12/20/2012    Assessment: Blood pressure control: mildly elevated Progress toward BP goal:  deteriorated Comments: ran out of amlodipine thus has not taking it today  Plan: Medications:  continue current medications Educational resources provided:   Self management tools provided:   Other plans: refilled amlodipine 5 mg qd, cont with quinapril

## 2013-01-10 ENCOUNTER — Other Ambulatory Visit: Payer: Self-pay | Admitting: Internal Medicine

## 2013-01-19 ENCOUNTER — Other Ambulatory Visit: Payer: Self-pay | Admitting: Internal Medicine

## 2013-02-19 ENCOUNTER — Other Ambulatory Visit: Payer: Self-pay | Admitting: Internal Medicine

## 2013-03-02 ENCOUNTER — Ambulatory Visit (INDEPENDENT_AMBULATORY_CARE_PROVIDER_SITE_OTHER): Payer: Medicare Other | Admitting: Internal Medicine

## 2013-03-02 VITALS — BP 132/88 | HR 96 | Temp 99.2°F | Ht 64.0 in | Wt 157.5 lb

## 2013-03-02 DIAGNOSIS — M79609 Pain in unspecified limb: Secondary | ICD-10-CM | POA: Diagnosis not present

## 2013-03-02 DIAGNOSIS — I10 Essential (primary) hypertension: Secondary | ICD-10-CM | POA: Diagnosis not present

## 2013-03-02 DIAGNOSIS — F323 Major depressive disorder, single episode, severe with psychotic features: Secondary | ICD-10-CM | POA: Diagnosis not present

## 2013-03-02 DIAGNOSIS — F330 Major depressive disorder, recurrent, mild: Secondary | ICD-10-CM | POA: Diagnosis not present

## 2013-03-02 DIAGNOSIS — J019 Acute sinusitis, unspecified: Secondary | ICD-10-CM

## 2013-03-02 DIAGNOSIS — M79641 Pain in right hand: Secondary | ICD-10-CM

## 2013-03-02 DIAGNOSIS — G4700 Insomnia, unspecified: Secondary | ICD-10-CM | POA: Diagnosis not present

## 2013-03-02 DIAGNOSIS — K219 Gastro-esophageal reflux disease without esophagitis: Secondary | ICD-10-CM | POA: Diagnosis not present

## 2013-03-02 DIAGNOSIS — F411 Generalized anxiety disorder: Secondary | ICD-10-CM | POA: Diagnosis not present

## 2013-03-02 MED ORDER — FLUTICASONE PROPIONATE 50 MCG/ACT NA SUSP: 1.0000 | Freq: Every day | NASAL | Status: DC

## 2013-03-02 MED ORDER — AMOXICILLIN-POT CLAVULANATE 875-125 MG PO TABS: 1.0000 | ORAL_TABLET | Freq: Two times a day (BID) | ORAL | Status: DC

## 2013-03-02 NOTE — Patient Instructions (Signed)
RICE: Routine Care for Injuries Rest, Ice, Compression, and Elevation (RICE) are often used to care for injuries. HOME CARE  Rest your injury.  Put ice on the injury.  Put ice in a plastic bag.  Place a towel between your skin and the bag.  Leave the ice on for 15-20 minutes, 03-04 times a day. Do this for as long as told by your doctor.  Apply pressure (compression) with an elastic bandage. Remove and reapply the bandage every 3 to 4 hours. Do not wrap the bandage too tight. Wrap the bandage looser if the fingers or toes are puffy (swollen), blue, cold, painful, or lose feeling (numb).  Raise (elevate) your injury. Raise your injury above the heart if you can. GET HELP RIGHT AWAY IF:  You have lasting pain or puffiness.  Your injury is red, weak, or loses feeling.  Your problems get worse, not better, after several days. MAKE SURE YOU:  Understand these instructions.  Will watch your condition.  Will get help right away if you are not doing well or get worse. Document Released: 07/21/2007 Document Revised: 04/26/2011 Document Reviewed: 07/03/2010 [facility] Patient Information 2014 [place], [place].
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
## 2013-03-06 NOTE — Assessment & Plan Note (Signed)
This most likely 2/2 ongoing repetitive movement and muscle strain. Pt has no weakness, sensory changes or other concerning findings on PE today. No signs of carpal tunnel or broken bones.  -RICE -wrist support and brace

## 2013-03-06 NOTE — Assessment & Plan Note (Signed)
Pt has ongoing symptoms of sinus pressure and nasal congestion along with low grade fevers for 3+ wks.  -augmentin -supportive management

## 2013-03-07 NOTE — Progress Notes (Signed)
Case discussed with Dr. Sadek soon after the resident saw the patient.  We reviewed the resident's history and exam and pertinent patient test results.  I agree with the assessment, diagnosis, and plan of care documented in the resident's note. 

## 2013-03-17 ENCOUNTER — Other Ambulatory Visit: Payer: Self-pay | Admitting: Internal Medicine

## 2013-03-29 ENCOUNTER — Other Ambulatory Visit: Payer: Self-pay | Admitting: *Deleted

## 2013-03-30 MED ORDER — SIMVASTATIN 20 MG PO TABS: ORAL_TABLET | ORAL | Status: DC

## 2013-04-27 ENCOUNTER — Other Ambulatory Visit: Payer: Self-pay | Admitting: *Deleted

## 2013-04-27 ENCOUNTER — Other Ambulatory Visit: Payer: Self-pay | Admitting: Internal Medicine

## 2013-04-27 DIAGNOSIS — J019 Acute sinusitis, unspecified: Secondary | ICD-10-CM

## 2013-04-27 DIAGNOSIS — I10 Essential (primary) hypertension: Secondary | ICD-10-CM

## 2013-04-27 DIAGNOSIS — K219 Gastro-esophageal reflux disease without esophagitis: Secondary | ICD-10-CM

## 2013-04-27 MED ORDER — FLUTICASONE PROPIONATE 50 MCG/ACT NA SUSP: 1.0000 | Freq: Every day | NASAL | Status: DC

## 2013-04-27 MED ORDER — QUINAPRIL HCL 5 MG PO TABS: ORAL_TABLET | ORAL | Status: DC

## 2013-04-27 MED ORDER — RANITIDINE HCL 150 MG PO TABS: 150.0000 mg | ORAL_TABLET | Freq: Two times a day (BID) | ORAL | Status: DC

## 2013-04-27 MED ORDER — SIMVASTATIN 20 MG PO TABS: ORAL_TABLET | ORAL | Status: DC

## 2013-04-27 MED ORDER — AMLODIPINE BESYLATE 5 MG PO TABS: 5.0000 mg | ORAL_TABLET | Freq: Every day | ORAL | Status: DC

## 2013-05-17 DIAGNOSIS — F411 Generalized anxiety disorder: Secondary | ICD-10-CM | POA: Diagnosis not present

## 2013-06-22 ENCOUNTER — Other Ambulatory Visit: Payer: Self-pay | Admitting: Internal Medicine

## 2013-06-22 DIAGNOSIS — I10 Essential (primary) hypertension: Secondary | ICD-10-CM

## 2013-07-17 DIAGNOSIS — H35369 Drusen (degenerative) of macula, unspecified eye: Secondary | ICD-10-CM | POA: Diagnosis not present

## 2013-07-19 DIAGNOSIS — F330 Major depressive disorder, recurrent, mild: Secondary | ICD-10-CM | POA: Diagnosis not present

## 2013-07-19 DIAGNOSIS — F411 Generalized anxiety disorder: Secondary | ICD-10-CM | POA: Diagnosis not present

## 2013-07-27 ENCOUNTER — Ambulatory Visit (HOSPITAL_COMMUNITY)
Admission: RE | Admit: 2013-07-27 | Discharge: 2013-07-27 | Disposition: A | Discharge status: Home / Self Care | Payer: Medicare Other | Source: Ambulatory Visit | Attending: Internal Medicine | Admitting: Internal Medicine

## 2013-07-27 ENCOUNTER — Encounter: Payer: Self-pay | Admitting: Internal Medicine

## 2013-07-27 ENCOUNTER — Ambulatory Visit (INDEPENDENT_AMBULATORY_CARE_PROVIDER_SITE_OTHER): Payer: Medicare Other | Admitting: Internal Medicine

## 2013-07-27 ENCOUNTER — Other Ambulatory Visit: Payer: Self-pay | Admitting: Internal Medicine

## 2013-07-27 VITALS — BP 153/90 | HR 90 | Temp 99.0°F | Ht 64.0 in | Wt 152.1 lb

## 2013-07-27 DIAGNOSIS — M1710 Unilateral primary osteoarthritis, unspecified knee: Secondary | ICD-10-CM | POA: Insufficient documentation

## 2013-07-27 DIAGNOSIS — Z0000 Encounter for general adult medical examination without abnormal findings: Secondary | ICD-10-CM

## 2013-07-27 DIAGNOSIS — J438 Other emphysema: Secondary | ICD-10-CM | POA: Diagnosis not present

## 2013-07-27 DIAGNOSIS — IMO0002 Reserved for concepts with insufficient information to code with codable children: Secondary | ICD-10-CM | POA: Diagnosis not present

## 2013-07-27 DIAGNOSIS — Z9289 Personal history of other medical treatment: Secondary | ICD-10-CM

## 2013-07-27 DIAGNOSIS — F323 Major depressive disorder, single episode, severe with psychotic features: Secondary | ICD-10-CM

## 2013-07-27 DIAGNOSIS — J8410 Pulmonary fibrosis, unspecified: Secondary | ICD-10-CM | POA: Diagnosis not present

## 2013-07-27 DIAGNOSIS — M25569 Pain in unspecified knee: Secondary | ICD-10-CM | POA: Diagnosis not present

## 2013-07-27 DIAGNOSIS — Z227 Latent tuberculosis: Secondary | ICD-10-CM

## 2013-07-27 DIAGNOSIS — R7611 Nonspecific reaction to tuberculin skin test without active tuberculosis: Secondary | ICD-10-CM

## 2013-07-27 DIAGNOSIS — D71 Functional disorders of polymorphonuclear neutrophils: Secondary | ICD-10-CM | POA: Diagnosis not present

## 2013-07-27 DIAGNOSIS — M1990 Unspecified osteoarthritis, unspecified site: Secondary | ICD-10-CM

## 2013-07-27 DIAGNOSIS — I10 Essential (primary) hypertension: Secondary | ICD-10-CM

## 2013-07-27 DIAGNOSIS — M2010 Hallux valgus (acquired), unspecified foot: Secondary | ICD-10-CM | POA: Diagnosis not present

## 2013-07-27 DIAGNOSIS — A310 Pulmonary mycobacterial infection: Secondary | ICD-10-CM | POA: Diagnosis not present

## 2013-07-27 NOTE — Assessment & Plan Note (Signed)
Hallux valgus deformity of both feet, L>R Patient requesting podiatry referral.  Plans: Recommended icing, NSAID's, Bunion pads, Night splinting. Podiatry referral.

## 2013-07-27 NOTE — Progress Notes (Signed)
Subjective:   Patient ID: Doris [name] female   DOB: 19-Mar-1945 68 y.o.   MRN: [id number]  HPI: [name] is a 68 y.o. woman with PMH significant for HTN, HLD, Depression comes to the office for the following reasons.  1. To discuss all her medications and clarify some of the medications. Patient wrote down all the medications she is taking and brought it to the clinic. Patient states that her psychiatrist stopped her Celexa, Ativan , Restoril and started her on Cymbalta and xanax.  2. She is requesting a referral for her bunion in both her feet. She reports that she has bunions in both feet for quite sometime now but the one in her left foot is causing her pain recently and wants to see podiatry.  3. She reports that she was diagnosed with osteoarthritis in both her knees and wants to see an orthopedic surgeon to see if they would consider giving cortisone infections to her knee joints.  4.  She is requesting gynecology referral for routine gynecology physical. She also states that she was told that she had ovarian cyst several years ago and she wants to know if she still has them.  5. She had CXR that showed calcified granuloma and right apical pleural scarring in 2014 and subsequently had a negative quantiferon gold test. She apparently had a positive PPD test many years ago. She wants to see a pulmonologist to discuss further details about it.  She denies any other complaints during this office visit.   Past Medical History  Diagnosis Date  . Hypertension   . Insomnia   . Vertigo   . Migraine   . Thyroid nodule 2010    TSH 04/2008 = 0.296, no follow up noted in [facility]  . HLD (hyperlipidemia)   . Psychosis     followed by Dr. Keenan [name] [phone]  . Acid reflux   . Back pain   . Osteoarthritis     knee   Current Outpatient Prescriptions  Medication Sig Dispense Refill  . ALPRAZolam (XANAX) 0.25 MG tablet Take 0.25 mg by mouth at bedtime as needed for anxiety.       Marland Kitchen amLODipine (NORVASC) 5 MG tablet Take 1 tablet (5 mg total) by mouth daily.  30 tablet  4  . DULoxetine (CYMBALTA) 20 MG capsule Take 20 mg by mouth daily.      . quinapril (ACCUPRIL) 5 MG tablet Take 1 tablet (5 mg total) by mouth daily.  30 tablet  11  . ranitidine (ZANTAC) 150 MG tablet Take 1 tablet (150 mg total) by mouth 2 (two) times daily.  60 tablet  3  . simvastatin (ZOCOR) 20 MG tablet TAKE 1 TABLET (20 MG TOTAL) BY MOUTH AT BEDTIME.  30 tablet  12  . calcium carbonate (OS-CAL - DOSED IN MG OF ELEMENTAL CALCIUM) 1250 MG tablet Take 1 tablet by mouth daily.      . hydrOXYzine (VISTARIL) 50 MG capsule Take 2 capsules (100 mg total) by mouth at bedtime as needed.  30 capsule  0   No current facility-administered medications for this visit.   Family History  Problem Relation Age of Onset  . Stroke Neg Hx   . Heart disease Neg Hx   . Cancer Neg Hx   . Colon cancer Neg Hx    History   Social History  . Marital Status: Divorced    Spouse Name: N/A    Number of Children: N/A  . Years of Education: N/A  Social History Main Topics  . Smoking status: Never Smoker   . Smokeless tobacco: Never Used  . Alcohol Use: No  . Drug Use: No  . Sexual Activity: No   Other Topics Concern  . None   Social History Narrative  . None   Review of Systems: Pertinent items are noted in HPI. Objective:  Physical Exam: Filed Vitals:   07/27/13 1446  BP: 153/90  Pulse: 90  Temp: 99 F (37.2 C)  TempSrc: Oral  Height: 5\' 4"  (1.626 m)  Weight: 152 lb 1.6 oz (68.992 kg)  SpO2: 98%   Constitutional: Vital signs reviewed.   Patient is a well-developed and well-nourished and is in no acute distress and cooperative with exam.  Cardiovascular: RRR, S1 normal, S2 normal, no MRG Pulmonary/Chest: normal respiratory effort, CTAB, no wheezes, rales, or rhonchi Musculoskeletal:  Knees: There is no visible swelling, redness or tenderness in both knee joints. Mild supra-patellar fullness  noted in both knee joints. ROM not tested. No joint deformities. Feet: Hallux valgus deformity noted in both feet. The left deformity appears slightly tender, more pronounced.  Neurological: A&O x3 Psychiatric: Patient got very emotional during this office visit and was into tears as she has been having pains in her knees, feet, shoulder for many months.   Assessment & Plan:

## 2013-07-27 NOTE — Assessment & Plan Note (Addendum)
Patient had a, what I believe, a positive PPD test in the past. CXR from 2014 revealed Medial right base calcified granuloma and right apical pleural scarring. Patient had a negative quantiferon gold assay in march 2014. Patient is currently asymptomatic from Tuberculosis perspective. Patient is requesting pulmonology referral to discuss further plans Discussed with the attending regarding further management and plan.  Plans: Repeat CXR, Quantiferon Gold Assay. Once I have the results back, will touch base with ID regarding further plans. Patient verbalized understanding of the plan.  Addendum: 08/01/13 Patient had a negative quantiferon gold assay and the repeat CXR revealed underlying emphysematous change and stable small calcified granuloma in right base.  As per uptodate and CDC recommendations, there is no need for further work up or referrals. With a negative quantiferon gold assay, according to online IGRA interpreter, her annual risk of developing active TB is 0%, cumulative risk of active TB upto age 16 yrs is 0%.  Called patients listed contact number and left a voice message about the results and explained that there is no need for further referrals or work up. Discussed with the attending Dr. Lynnae January and came up with this plan.

## 2013-07-27 NOTE — Assessment & Plan Note (Signed)
Follows with psychiatrist.  Plans: Per psych

## 2013-07-27 NOTE — Patient Instructions (Signed)
Follow-up in 2 weeks

## 2013-07-27 NOTE — Assessment & Plan Note (Signed)
Slightly elevated. In the setting of knee pains, foot pains, shoulder pains.  Plans: Continue current regimen for now. Will monitor at the next office visit.

## 2013-07-27 NOTE — Assessment & Plan Note (Signed)
Patient complaining of both knee joint pains, suspect secondary to OA.  Plans: Xrays of both knees. Orthopedic referral.

## 2013-07-28 LAB — HIV ANTIBODY (ROUTINE TESTING W REFLEX): HIV 1&2 Ab, 4th Generation: NONREACTIVE

## 2013-07-30 LAB — QUANTIFERON TB GOLD ASSAY (BLOOD)
Interferon Gamma Release Assay: NEGATIVE
Mitogen value: 5.73 IU/mL
QUANTIFERON TB AG MINUS NIL: 0 [IU]/mL
Quantiferon Nil Value: 0.01 IU/mL
TB Ag value: 0.01 IU/mL

## 2013-07-30 NOTE — Progress Notes (Signed)
I saw patient and discussed her care with Dr. Eyvonne Mechanic at the time of the visit.  We reviewed the resident's history and exam and pertinent patient test results.  I agree with the assessment, diagnosis, and plan of care documented in the resident's note.

## 2013-08-01 ENCOUNTER — Telehealth: Payer: Self-pay | Admitting: Internal Medicine

## 2013-08-01 NOTE — Telephone Encounter (Signed)
Patient called back after I left a voice message about her test results. Again discussed her quantiferon gold assay, cxr findings and the plans. Patient agreed with the plans and sounded happy with the results and plan.

## 2013-08-10 ENCOUNTER — Ambulatory Visit: Payer: Medicare Other | Admitting: Internal Medicine

## 2013-08-14 ENCOUNTER — Other Ambulatory Visit: Payer: Self-pay | Admitting: Internal Medicine

## 2013-08-20 ENCOUNTER — Encounter: Payer: Self-pay | Admitting: *Deleted

## 2013-08-28 DIAGNOSIS — M25569 Pain in unspecified knee: Secondary | ICD-10-CM | POA: Diagnosis not present

## 2013-08-28 DIAGNOSIS — M1710 Unilateral primary osteoarthritis, unspecified knee: Secondary | ICD-10-CM | POA: Diagnosis not present

## 2013-10-11 DIAGNOSIS — F411 Generalized anxiety disorder: Secondary | ICD-10-CM | POA: Diagnosis not present

## 2013-10-11 DIAGNOSIS — F330 Major depressive disorder, recurrent, mild: Secondary | ICD-10-CM | POA: Diagnosis not present

## 2013-10-18 DIAGNOSIS — H10439 Chronic follicular conjunctivitis, unspecified eye: Secondary | ICD-10-CM | POA: Diagnosis not present

## 2013-10-30 ENCOUNTER — Other Ambulatory Visit: Payer: Self-pay | Admitting: Internal Medicine

## 2013-11-21 ENCOUNTER — Telehealth: Payer: Self-pay | Admitting: *Deleted

## 2013-11-21 NOTE — Telephone Encounter (Signed)
Pt walked in to clinic - past 2 weeks has yellow productive cough and chest hurts. Problems sleeping. No appt available to clinic so far - appt given 11/22/13 8:15AM Dr Algis Liming. Pt prefers not to go to ER due to cost and unable to find Urgent Care due to relocation. Hilda Blades Rayder Sullenger RN 11/21/13 10:40AM

## 2013-11-22 ENCOUNTER — Ambulatory Visit (INDEPENDENT_AMBULATORY_CARE_PROVIDER_SITE_OTHER): Payer: Medicare Other | Admitting: Internal Medicine

## 2013-11-22 VITALS — BP 128/73 | HR 93 | Temp 98.7°F | Wt 149.1 lb

## 2013-11-22 DIAGNOSIS — J069 Acute upper respiratory infection, unspecified: Secondary | ICD-10-CM | POA: Diagnosis not present

## 2013-11-22 DIAGNOSIS — B9689 Other specified bacterial agents as the cause of diseases classified elsewhere: Secondary | ICD-10-CM | POA: Insufficient documentation

## 2013-11-22 DIAGNOSIS — J208 Acute bronchitis due to other specified organisms: Secondary | ICD-10-CM

## 2013-11-22 MED ORDER — GUAIFENESIN 200 MG PO TABS: 200.0000 mg | ORAL_TABLET | ORAL | Status: DC | PRN

## 2013-11-22 MED ORDER — GUAIFENESIN-CODEINE 100-10 MG/5ML PO SOLN: 10.0000 mL | ORAL | Status: DC | PRN

## 2013-11-22 NOTE — Assessment & Plan Note (Signed)
Pt centor score of -1 making strep pharyngitis less likely. Pt appears to have recovered from initial infection and has no signs of systemic infection or PNA at this time.  -codeine cough syrup and guaifenesin prn cough -Recommend symptomatic therapy suggested: push fluids, rest, gargle warm salt water, use vaporizer or mist prn and return office visit prn if symptoms persist or worsen. Lack of antibiotic effectiveness discussed with her. Call or return to clinic prn if these symptoms worsen or fail to improve as anticipated.

## 2013-11-22 NOTE — Progress Notes (Signed)
Subjective:   Patient ID: Aleila Syverson female   DOB: [birth year] 68 y.o.   MRN: [id number]  HPI: Ms.Nandana Groesbeck is a 68 y.o. woman pmh as listed below presents for acute cough.   Pt complains of congestion, sneezing, post nasal drip, productive cough and chills for 14 days. She denies a history of anorexia, chest pain, dizziness, myalgias, vomiting, weakness, weight loss and wheezing and denies a history of asthma. Patient does not smoke cigarettes. Patient states that she was recently exposed to her sick grandson who had similar symptoms. Within 2-3 days after that she began having her symptoms as listed above. She has tried over-the-counter Robitussin-DM, Vistaril, and Tylenol for relief. She continues to have coughing spells that is productive yellow sputum at times and has been causing some "rib cage pain." She has not had any hemoptysis or hematemesis and minimal to no dyspnea on exertion or shortness of breath. She has never had an inhaler or used one.  - Past Medical History  Diagnosis Date  . Hypertension   . Insomnia   . Vertigo   . Migraine   . Thyroid nodule 2010    TSH 04/2008 = 0.296, no follow up noted in [facility]  . HLD (hyperlipidemia)   . Psychosis     followed by Dr. Keenan Bachelor [phone]  . Acid reflux   . Back pain   . Osteoarthritis     knee   Current Outpatient Prescriptions  Medication Sig Dispense Refill  . ALPRAZolam (XANAX) 0.25 MG tablet Take 0.25 mg by mouth at bedtime as needed for anxiety.      Marland Kitchen amLODipine (NORVASC) 5 MG tablet TAKE 1 TABLET BY MOUTH EVERY DAY  30 tablet  3  . calcium carbonate (OS-CAL - DOSED IN MG OF ELEMENTAL CALCIUM) 1250 MG tablet Take 1 tablet by mouth daily.      . DULoxetine (CYMBALTA) 20 MG capsule Take 20 mg by mouth daily.      Marland Kitchen guaiFENesin 200 MG tablet Take 1 tablet (200 mg total) by mouth every 4 (four) hours as needed for cough or to loosen phlegm.  30 suppository  0  . guaiFENesin-codeine 100-10 MG/5ML syrup Take 10  mLs by mouth every 4 (four) hours as needed for cough.  120 mL  0  . hydrOXYzine (VISTARIL) 50 MG capsule Take 2 capsules (100 mg total) by mouth at bedtime as needed.  30 capsule  0  . pantoprazole (PROTONIX) 40 MG tablet Take 1 tablet (40 mg total) by mouth daily.  30 tablet  0  . quinapril (ACCUPRIL) 5 MG tablet Take 1 tablet (5 mg total) by mouth daily.  30 tablet  11  . ranitidine (ZANTAC) 150 MG tablet Take 1 tablet (150 mg total) by mouth 2 (two) times daily.  60 tablet  3  . simvastatin (ZOCOR) 20 MG tablet TAKE 1 TABLET (20 MG TOTAL) BY MOUTH AT BEDTIME.  30 tablet  12   No current facility-administered medications for this visit.   Family History  Problem Relation Age of Onset  . Stroke Neg Hx   . Heart disease Neg Hx   . Cancer Neg Hx   . Colon cancer Neg Hx    History   Social History  . Marital Status: Divorced    Spouse Name: N/A    Number of Children: N/A  . Years of Education: N/A   Social History Main Topics  . Smoking status: Never Smoker   . Smokeless tobacco: Never Used  .  Alcohol Use: No  . Drug Use: No  . Sexual Activity: No   Other Topics Concern  . Not on file   Social History Narrative  . No narrative on file   Review of Systems: Pertinent listed in history of present illness.  Objective:  Physical Exam: Filed Vitals:   11/22/13 0833  BP: 128/73  Pulse: 93  Temp: 98.7 F (37.1 C)  TempSrc: Oral  Weight: 149 lb 1.6 oz (67.631 kg)  SpO2: 100%   General: sitting in chair, NAD HEENT: PERRL, EOMI, no scleral icterus, ears w/o erythema some congestion with clear fluid, TM pearly gray, no cervical LAD, some clear PND, oropharynx moist and w/o erythema Cardiac: RRR, no rubs, murmurs or gallops Pulm: clear to auscultation bilaterally, no crackles/wheezes or rhonchi, some upper airway noise radiating to upper lung fields, moving normal volumes of air Abd: soft, nontender, nondistended, BS present Ext: warm and well perfused, no pedal  edema Neuro: alert and oriented X3, cranial nerves II-XII grossly intact  Assessment & Plan:  Please see problem oriented charting  Pt discussed with Dr. Lynnae January

## 2013-11-22 NOTE — Patient Instructions (Signed)
General Instructions:   Please try to bring all your medicines next time. This will help [place] keep you safe from mistakes.   Progress Toward Treatment Goals:  Treatment Goal 01/02/2013  Blood pressure deteriorated    Self Care Goals & Plans:  Self Care Goal 03/02/2013  Manage my medications take my medicines as prescribed; bring my medications to every visit; refill my medications on time  Monitor my health -  Eat healthy foods drink diet soda or water instead of juice or soda; eat more vegetables; eat foods that are low in salt; eat baked foods instead of fried foods  Be physically active -  Other -    No flowsheet data found.   Care Management & Community Referrals:  Referral 05/01/2012  Referrals made for care management support none needed

## 2013-11-26 ENCOUNTER — Encounter: Payer: Self-pay | Admitting: Internal Medicine

## 2013-11-26 ENCOUNTER — Ambulatory Visit (INDEPENDENT_AMBULATORY_CARE_PROVIDER_SITE_OTHER): Payer: Medicare Other | Admitting: Internal Medicine

## 2013-11-26 VITALS — BP 142/69 | HR 86 | Temp 97.9°F | Wt 148.3 lb

## 2013-11-26 DIAGNOSIS — J209 Acute bronchitis, unspecified: Secondary | ICD-10-CM

## 2013-11-26 DIAGNOSIS — B9689 Other specified bacterial agents as the cause of diseases classified elsewhere: Secondary | ICD-10-CM

## 2013-11-26 DIAGNOSIS — Z1231 Encounter for screening mammogram for malignant neoplasm of breast: Secondary | ICD-10-CM | POA: Diagnosis not present

## 2013-11-26 DIAGNOSIS — J069 Acute upper respiratory infection, unspecified: Secondary | ICD-10-CM | POA: Diagnosis not present

## 2013-11-26 DIAGNOSIS — I10 Essential (primary) hypertension: Secondary | ICD-10-CM | POA: Diagnosis not present

## 2013-11-26 DIAGNOSIS — J208 Acute bronchitis due to other specified organisms: Principal | ICD-10-CM

## 2013-11-26 MED ORDER — AZITHROMYCIN 250 MG PO TABS: ORAL_TABLET | ORAL | Status: DC

## 2013-11-26 MED ORDER — GUAIFENESIN-CODEINE 100-10 MG/5ML PO SOLN: 10.0000 mL | ORAL | Status: DC | PRN

## 2013-11-26 NOTE — Assessment & Plan Note (Signed)
BP Readings from Last 3 Encounters:  11/26/13 142/69  11/22/13 128/73  07/27/13 153/90   Lab Results  Component Value Date   NA 140 12/20/2012   K 3.4* 12/20/2012   CREATININE 0.67 12/20/2012   Assessment: Blood pressure control: mildly elevated Progress toward BP goal:  deteriorated  Plan: Medications:  continue current medications norvasc and quinapril

## 2013-11-26 NOTE — Progress Notes (Signed)
Internal Medicine Clinic Attending  Case discussed with Dr. Sadek soon after the resident saw the patient.  We reviewed the resident's history and exam and pertinent patient test results.  I agree with the assessment, diagnosis, and plan of care documented in the resident's note. 

## 2013-11-26 NOTE — Assessment & Plan Note (Signed)
Persistent productive cough, mild SOB.   -will try zpack, RTC 1 week if no improvement. Given hx of latent tb, if no improvement with antibiotics, may consider cxr and further work up on follow up visit.  -refilled guaifenesin with codeine
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
## 2013-11-26 NOTE — Patient Instructions (Signed)
General Instructions:  Please bring your medicines with you each time you come to clinic.  Medicines may include prescription medications, over-the-counter medications, herbal remedies, eye drops, vitamins, or other pills.  Lets try zpack for your bronchitis and follow up in 1 week if no better. If your improve, you may cancel your appointment. [phone]  Progress Toward Treatment Goals:  Treatment Goal 11/26/2013  Blood pressure deteriorated    Self Care Goals & Plans:  Self Care Goal 03/02/2013  Manage my medications take my medicines as prescribed; bring my medications to every visit; refill my medications on time  Monitor my health -  Eat healthy foods drink diet soda or water instead of juice or soda; eat more vegetables; eat foods that are low in salt; eat baked foods instead of fried foods  Be physically active -  Other -    No flowsheet data found.   Care Management & Community Referrals:  Referral 05/01/2012  Referrals made for care management support none needed     Azithromycin tablets What is this medicine? AZITHROMYCIN (az ith roe MYE sin) is a macrolide antibiotic. It is used to treat or prevent certain kinds of bacterial infections. It will not work for colds, flu, or other viral infections. This medicine may be used for other purposes; ask your health care provider or pharmacist if you have questions. COMMON BRAND NAME(S): Zithromax, Zithromax Tri-Pak, Zithromax Z-Pak What should I tell my health care provider before I take this medicine? They need to know if you have any of these conditions: -kidney disease -liver disease -irregular heartbeat or heart disease -an unusual or allergic reaction to azithromycin, erythromycin, other macrolide antibiotics, foods, dyes, or preservatives -pregnant or trying to get pregnant -breast-feeding How should I use this medicine? Take this medicine by mouth with a full glass of water. Follow the directions on the  prescription label. The tablets can be taken with food or on an empty stomach. If the medicine upsets your stomach, take it with food. Take your medicine at regular intervals. Do not take your medicine more often than directed. Take all of your medicine as directed even if you think your are better. Do not skip doses or stop your medicine early. Talk to your pediatrician regarding the use of this medicine in children. Special care may be needed. Overdosage: If you think you have taken too much of this medicine contact a poison control center or emergency room at once. NOTE: This medicine is only for you. Do not share this medicine with others. What if I miss a dose? If you miss a dose, take it as soon as you can. If it is almost time for your next dose, take only that dose. Do not take double or extra doses. What may interact with this medicine? Do not take this medicine with any of the following medications: -lincomycin This medicine may also interact with the following medications: -amiodarone -antacids -birth control pills -cyclosporine -digoxin -magnesium -nelfinavir -phenytoin -warfarin This list may not describe all possible interactions. Give your health care provider a list of all the medicines, herbs, non-prescription drugs, or dietary supplements you use. Also tell them if you smoke, drink alcohol, or use illegal drugs. Some items may interact with your medicine. What should I watch for while using this medicine? Tell your doctor or health care professional if your symptoms do not improve. Do not treat diarrhea with over the counter products. Contact your doctor if you have diarrhea that lasts more than 2  days or if it is severe and watery. This medicine can make you more sensitive to the sun. Keep out of the sun. If you cannot avoid being in the sun, wear protective clothing and use sunscreen. Do not use sun lamps or tanning beds/booths. What side effects may I notice from receiving  this medicine? Side effects that you should report to your doctor or health care professional as soon as possible: -allergic reactions like skin rash, itching or hives, swelling of the face, lips, or tongue -confusion, nightmares or hallucinations -dark urine -difficulty breathing -hearing loss -irregular heartbeat or chest pain -pain or difficulty passing urine -redness, blistering, peeling or loosening of the skin, including inside the mouth -white patches or sores in the mouth -yellowing of the eyes or skin Side effects that usually do not require medical attention (report to your doctor or health care professional if they continue or are bothersome): -diarrhea -dizziness, drowsiness -headache -stomach upset or vomiting -tooth discoloration -vaginal irritation This list may not describe all possible side effects. Call your doctor for medical advice about side effects. You may report side effects to FDA at 1-800-FDA-1088. Where should I keep my medicine? Keep out of the reach of children. Store at room temperature between 15 and 30 degrees C (59 and 86 degrees F). Throw away any unused medicine after the expiration date. NOTE: This sheet is a summary. It may not cover all possible information. If you have questions about this medicine, talk to your doctor, pharmacist, or health care provider.  2015, [employer] Standard. (2012-09-07 15:38:48)

## 2013-11-27 NOTE — Progress Notes (Signed)
Internal Medicine Clinic Attending  Case discussed with Dr. Qureshi soon after the resident saw the patient.  We reviewed the resident's history and exam and pertinent patient test results.  I agree with the assessment, diagnosis, and plan of care documented in the resident's note. 

## 2013-11-30 ENCOUNTER — Other Ambulatory Visit: Payer: Self-pay

## 2013-12-27 DIAGNOSIS — F410 Panic disorder [episodic paroxysmal anxiety] without agoraphobia: Secondary | ICD-10-CM | POA: Diagnosis not present

## 2013-12-27 DIAGNOSIS — F331 Major depressive disorder, recurrent, moderate: Secondary | ICD-10-CM | POA: Diagnosis not present

## 2014-01-28 DIAGNOSIS — M1712 Unilateral primary osteoarthritis, left knee: Secondary | ICD-10-CM | POA: Diagnosis not present

## 2014-01-28 DIAGNOSIS — M1711 Unilateral primary osteoarthritis, right knee: Secondary | ICD-10-CM | POA: Diagnosis not present

## 2014-02-26 DIAGNOSIS — Z23 Encounter for immunization: Secondary | ICD-10-CM | POA: Diagnosis not present

## 2014-03-03 ENCOUNTER — Other Ambulatory Visit: Payer: Self-pay | Admitting: Internal Medicine

## 2014-03-13 ENCOUNTER — Encounter: Payer: Self-pay | Admitting: Internal Medicine

## 2014-03-23 DIAGNOSIS — S20211A Contusion of right front wall of thorax, initial encounter: Secondary | ICD-10-CM | POA: Diagnosis not present

## 2014-03-23 DIAGNOSIS — Z23 Encounter for immunization: Secondary | ICD-10-CM | POA: Diagnosis not present

## 2014-03-23 DIAGNOSIS — I10 Essential (primary) hypertension: Secondary | ICD-10-CM | POA: Diagnosis not present

## 2014-03-23 DIAGNOSIS — K219 Gastro-esophageal reflux disease without esophagitis: Secondary | ICD-10-CM | POA: Diagnosis present

## 2014-03-25 ENCOUNTER — Encounter: Payer: Self-pay | Admitting: Internal Medicine

## 2014-03-25 ENCOUNTER — Ambulatory Visit (INDEPENDENT_AMBULATORY_CARE_PROVIDER_SITE_OTHER): Payer: Medicare Other | Admitting: Internal Medicine

## 2014-03-25 VITALS — BP 144/72 | HR 99 | Temp 98.8°F | Ht 64.0 in | Wt 147.2 lb

## 2014-03-25 DIAGNOSIS — I10 Essential (primary) hypertension: Secondary | ICD-10-CM | POA: Diagnosis not present

## 2014-03-25 DIAGNOSIS — Z23 Encounter for immunization: Secondary | ICD-10-CM | POA: Diagnosis not present

## 2014-03-25 DIAGNOSIS — K219 Gastro-esophageal reflux disease without esophagitis: Secondary | ICD-10-CM | POA: Diagnosis not present

## 2014-03-25 DIAGNOSIS — S2220XA Unspecified fracture of sternum, initial encounter for closed fracture: Secondary | ICD-10-CM | POA: Insufficient documentation

## 2014-03-25 DIAGNOSIS — S20211A Contusion of right front wall of thorax, initial encounter: Secondary | ICD-10-CM | POA: Diagnosis not present

## 2014-03-25 MED ORDER — QUINAPRIL HCL 5 MG PO TABS: 5.0000 mg | ORAL_TABLET | Freq: Every day | ORAL | Status: DC

## 2014-03-25 MED ORDER — AMLODIPINE BESYLATE 5 MG PO TABS: 5.0000 mg | ORAL_TABLET | Freq: Every day | ORAL | Status: DC

## 2014-03-25 MED ORDER — SIMVASTATIN 20 MG PO TABS: ORAL_TABLET | ORAL | Status: DC

## 2014-03-25 MED ORDER — RANITIDINE HCL 150 MG PO TABS: 150.0000 mg | ORAL_TABLET | Freq: Two times a day (BID) | ORAL | Status: DC

## 2014-03-25 NOTE — Assessment & Plan Note (Signed)
Pt suffered a mechanical fall onto the kitchen table 2 days ago. She had not noticed any increased SOB, DOE, palpitations, or dysphagia. The patient has a small hematoma along the midsternal access of the chest wall. She has no limitations on inspiration or expiration. Physical exam was essentially normal. The patient refused imaging today. -Symptomatic management -Follow-up if doesn't improve

## 2014-03-25 NOTE — Addendum Note (Signed)
Addended by: Jerrye Noble on: 03/25/2014 10:40 AM   Modules accepted: Level of Service

## 2014-03-25 NOTE — Progress Notes (Signed)
Subjective:   Patient ID: Doris [name] female   DOB: 04/07/1945 69 y.o.   MRN: [id number]  HPI: [name] is a 69 y.o. woman pmh as listed below presents for acute fall.   Pt state that on Saturday she was placing eyedrops in her eye when she tripped along the length of the kitchen table and fell forward onto the kitchen table. The side of the table her chest. She did not hear any cracking noise, has not had any shortness of breath, dyspnea on exertion, chest pain, palpitations, or dysphagia since that time. She just describes some overall soreness.  She is also here for medication refills as she needs 90 day supplies has not had her meds for about 1-2 days.  Past Medical History  Diagnosis Date  . Hypertension   . Insomnia   . Vertigo   . Migraine   . Thyroid nodule 2010    TSH 04/2008 = 0.296, no follow up noted in [facility]  . HLD (hyperlipidemia)   . Psychosis     followed by Dr. Keenan [name] [phone]  . Acid reflux   . Back pain   . Osteoarthritis     knee   Current Outpatient Prescriptions  Medication Sig Dispense Refill  . amLODipine (NORVASC) 5 MG tablet Take 1 tablet (5 mg total) by mouth daily. 90 tablet 3  . calcium carbonate (OS-CAL - DOSED IN MG OF ELEMENTAL CALCIUM) 1250 MG tablet Take 1 tablet by mouth daily.    . DULoxetine (CYMBALTA) 20 MG capsule Take 20 mg by mouth daily.    Marland Kitchen PATADAY 0.2 % SOLN     . quinapril (ACCUPRIL) 5 MG tablet Take 1 tablet (5 mg total) by mouth daily. 90 tablet 3  . ranitidine (ZANTAC) 150 MG tablet Take 1 tablet (150 mg total) by mouth 2 (two) times daily. 90 tablet 3  . simvastatin (ZOCOR) 20 MG tablet TAKE 1 TABLET (20 MG TOTAL) BY MOUTH AT BEDTIME. 90 tablet 3   No current facility-administered medications for this visit.   Family History  Problem Relation Age of Onset  . Stroke Neg Hx   . Heart disease Neg Hx   . Cancer Neg Hx   . Colon cancer Neg Hx    History   Social History  . Marital Status: Divorced   Spouse Name: N/A    Number of Children: N/A  . Years of Education: N/A   Social History Main Topics  . Smoking status: Never Smoker   . Smokeless tobacco: Never Used  . Alcohol Use: No  . Drug Use: No  . Sexual Activity: No   Other Topics Concern  . None   Social History Narrative   Review of Systems: Pertinent items are noted in HPI. Objective:  Physical Exam: Filed Vitals:   03/25/14 0822  BP: 144/72  Pulse: 99  Temp: 98.8 F (37.1 C)  TempSrc: Oral  Height: 5\' 4"  (1.626 m)  Weight: 147 lb 3.2 oz (66.769 kg)  SpO2: 99%   General: sitting in chair, NAD  HEENT: PERRL, EOMI, no scleral icterus Cardiac: RRR, no rubs, murmurs or gallops Pulm: clear to auscultation bilaterally, moving normal volumes of air Abd: soft, nontender, nondistended, BS present Ext: warm and well perfused, no pedal edema, small superficial hematoma along the midsternum with some ecchymosis, no palpable crepitus Neuro: alert and oriented X3, cranial nerves II-XII grossly intact  Assessment & Plan:  Please see problem oriented charting  Pt discussed with Dr. Dareen Piano

## 2014-03-25 NOTE — Patient Instructions (Addendum)
General Instructions:   Please try to bring all your medicines next time. This will help [place] keep you safe from mistakes.   Progress Toward Treatment Goals:  Treatment Goal 11/26/2013  Blood pressure deteriorated    Self Care Goals & Plans:  Self Care Goal 03/25/2014  Manage my medications take my medicines as prescribed; bring my medications to every visit; refill my medications on time  Monitor my health -  Eat healthy foods drink diet soda or water instead of juice or soda; eat more vegetables; eat foods that are low in salt; eat baked foods instead of fried foods; eat fruit for snacks and desserts  Be physically active -  Other -    No flowsheet data found.   Care Management & Community Referrals:  Referral 05/01/2012  Referrals made for care management support none needed       Chest Contusion A chest contusion is a deep bruise on your chest area. Contusions are the result of an injury that caused bleeding under the skin. A chest contusion may involve bruising of the skin, muscles, or ribs. The contusion may turn blue, purple, or yellow. Minor injuries will give you a painless contusion, but more severe contusions may stay painful and swollen for a few weeks. CAUSES  A contusion is usually caused by a blow, trauma, or direct force to an area of the body. SYMPTOMS   Swelling and redness of the injured area.  Discoloration of the injured area.  Tenderness and soreness of the injured area.  Pain. DIAGNOSIS  The diagnosis can be made by taking a history and performing a physical exam. An X-ray, CT scan, or MRI may be needed to determine if there were any associated injuries, such as broken bones (fractures) or internal injuries. TREATMENT  Often, the best treatment for a chest contusion is resting, icing, and applying cold compresses to the injured area. Deep breathing exercises may be recommended to reduce the risk of pneumonia. Over-the-counter medicines may also be  recommended for pain control. HOME CARE INSTRUCTIONS   Put ice on the injured area.  Put ice in a plastic bag.  Place a towel between your skin and the bag.  Leave the ice on for 15-20 minutes, 03-04 times a day.  Only take over-the-counter or prescription medicines as directed by your caregiver. Your caregiver may recommend avoiding anti-inflammatory medicines (aspirin, ibuprofen, and naproxen) for 48 hours because these medicines may increase bruising.  Rest the injured area.  Perform deep-breathing exercises as directed by your caregiver.  Stop smoking if you smoke.  Do not lift objects over 5 pounds (2.3 kg) for 3 days or longer if recommended by your caregiver. SEEK IMMEDIATE MEDICAL CARE IF:   You have increased bruising or swelling.  You have pain that is getting worse.  You have difficulty breathing.  You have dizziness, weakness, or fainting.  You have blood in your urine or stool.  You cough up or vomit blood.  Your swelling or pain is not relieved with medicines. MAKE SURE YOU:   Understand these instructions.  Will watch your condition.  Will get help right away if you are not doing well or get worse. Document Released: 10/27/2000 Document Revised: 10/27/2011 Document Reviewed: 07/26/2011 [facility] Patient Information 2015 [place], [place]. This information is not intended to replace advice given to you by your health care provider. Make sure you discuss any questions you have with your health care provider.

## 2014-03-25 NOTE — Assessment & Plan Note (Signed)
BP Readings from Last 3 Encounters:  03/25/14 144/72  11/26/13 142/69  11/22/13 128/73    Lab Results  Component Value Date   NA 140 12/20/2012   K 3.4* 12/20/2012   CREATININE 0.67 12/20/2012    Assessment: Blood pressure control:   Progress toward BP goal:    Comments: pt has not had her meds and would like 90 day supply  Plan: Medications:  continue current medications of amlodipine 5 mg daily, Accupril 5 mg daily Educational resources provided: brochure (denies) Self management tools provided: home blood pressure logbook (denies need for) Other plans: Follow-up in one to 2 months

## 2014-03-26 NOTE — Progress Notes (Signed)
INTERNAL MEDICINE TEACHING ATTENDING ADDENDUM - Jalayne Ganesh, MD: I reviewed and discussed at the time of visit with the resident Dr. Sadek, the patient's medical history, physical examination, diagnosis and results of pertinent tests and treatment and I agree with the patient's care as documented.  

## 2014-04-02 ENCOUNTER — Emergency Department (INDEPENDENT_AMBULATORY_CARE_PROVIDER_SITE_OTHER): Payer: Medicare Other

## 2014-04-02 ENCOUNTER — Emergency Department (INDEPENDENT_AMBULATORY_CARE_PROVIDER_SITE_OTHER)
Admission: EM | Admit: 2014-04-02 | Discharge: 2014-04-02 | Disposition: A | Discharge status: Home / Self Care | Payer: Medicare Other | Source: Home / Self Care | Attending: Family Medicine | Admitting: Family Medicine

## 2014-04-02 ENCOUNTER — Encounter (HOSPITAL_COMMUNITY): Payer: Self-pay | Admitting: *Deleted

## 2014-04-02 DIAGNOSIS — S299XXA Unspecified injury of thorax, initial encounter: Secondary | ICD-10-CM | POA: Diagnosis not present

## 2014-04-02 DIAGNOSIS — S20211D Contusion of right front wall of thorax, subsequent encounter: Secondary | ICD-10-CM

## 2014-04-02 DIAGNOSIS — R0789 Other chest pain: Secondary | ICD-10-CM | POA: Diagnosis not present

## 2014-04-02 MED ORDER — TRAMADOL HCL 50 MG PO TABS: 50.0000 mg | ORAL_TABLET | Freq: Four times a day (QID) | ORAL | Status: DC | PRN

## 2014-04-02 NOTE — ED Notes (Signed)
Pt  Fell  11   Days  Ago      Hit  Chest   On  The  Edge  Of a  Table      -      She  States  She  Hit  Her  heada As  Well    - she  Had  Bruising  To  Chest     Earlier  Which is  Better  But  The  Pain in  Her   Chest  Is   Worse  -  Pain is  Worse  On palpation    And  Movements    She  Was  Seen  Soon  After  By  Her  pcp  But  Was  Not  X  Rayed

## 2014-04-02 NOTE — ED Notes (Signed)
Bp  117  / 90   Taken  By  Dr    Wendi Snipes

## 2014-04-02 NOTE — ED Provider Notes (Signed)
CSN: [id number]     Arrival date & time 04/02/14  1306 History   First MD Initiated Contact with Patient 04/02/14 1432     Chief Complaint  Patient presents with  . Fall   (Consider location/radiation/quality/duration/timing/severity/associated sxs/prior Treatment) HPI   Patient here with continued left chest pain after a fall 11 days ago. She explains that she was putting eyedrops in her eyes when she leaned forward she fell forward hitting her chest on the corner of encounter. She also hit her posterior head, and left leg at that time which are doing well. She has not had headaches, any bleeding or swelling, tenderness of the head, or problems walking. She states that her left chest pain hurts worse with arm movement, driving, coughing, and deep inspiration. Its described as similar to "when I broke my arm" and radiates to her BL shoulders. She denies any dyspnea except with frustration about her chest pain. She denies change in oral intake, neck pain, fever, or chills.    Past Medical History  Diagnosis Date  . Hypertension   . Insomnia   . Vertigo   . Migraine   . Thyroid nodule 2010    TSH 04/2008 = 0.296, no follow up noted in [facility]  . HLD (hyperlipidemia)   . Psychosis     followed by Dr. Keenan Bachelor [phone]  . Acid reflux   . Back pain   . Osteoarthritis     knee   Past Surgical History  Procedure Laterality Date  . Uterine fibroid surgery    . Tonsillectomy     Family History  Problem Relation Age of Onset  . Stroke Neg Hx   . Heart disease Neg Hx   . Cancer Neg Hx   . Colon cancer Neg Hx    History  Substance Use Topics  . Smoking status: Never Smoker   . Smokeless tobacco: Never Used  . Alcohol Use: No   OB History    No data available     Review of Systems   Per HPI  Allergies  Lidocaine and Olanzapine  Home Medications   Prior to Admission medications   Medication Sig Start Date End Date Taking? Authorizing Provider  amLODipine (NORVASC)  5 MG tablet Take 1 tablet (5 mg total) by mouth daily. 03/25/14   Clinton Gallant, MD  calcium carbonate (OS-CAL - DOSED IN MG OF ELEMENTAL CALCIUM) 1250 MG tablet Take 1 tablet by mouth daily.    Historical Provider, MD  DULoxetine (CYMBALTA) 20 MG capsule Take 20 mg by mouth daily.    Historical Provider, MD  PATADAY 0.2 % SOLN  10/18/13   Historical Provider, MD  quinapril (ACCUPRIL) 5 MG tablet Take 1 tablet (5 mg total) by mouth daily. 03/25/14   Clinton Gallant, MD  ranitidine (ZANTAC) 150 MG tablet Take 1 tablet (150 mg total) by mouth 2 (two) times daily. 03/25/14   Clinton Gallant, MD  simvastatin (ZOCOR) 20 MG tablet TAKE 1 TABLET (20 MG TOTAL) BY MOUTH AT BEDTIME. 03/25/14   Clinton Gallant, MD  traMADol (ULTRAM) 50 MG tablet Take 1 tablet (50 mg total) by mouth every 6 (six) hours as needed. 04/02/14   Timmothy Euler, MD   BP 189/95 mmHg  Pulse 101  Temp(Src) 99.3 F (37.4 C) (Oral)  Resp 16  SpO2 98%  LMP 03/27/1984 Physical Exam  ED Course  Procedures (including critical care time) Labs Review Labs Reviewed - No data to display  Imaging Review Dg Chest 2  View  04/02/2014   CLINICAL DATA:  69 year old female who fell 2 weeks ago with continued central and left chest pain radiating to both upper extremities. Shortness of Breath. Initial encounter.  EXAM: CHEST  2 VIEW  COMPARISON:  07/27/2013 and earlier.  FINDINGS: Stable lung volumes. Normal cardiac size and mediastinal contours. Visualized tracheal air column is within normal limits. No pneumothorax or pleural effusion. No acute pulmonary opacity. Stable and normal anterior clear space. No displaced rib fracture identified.  IMPRESSION: No acute cardiopulmonary abnormality or acute traumatic injury identified.   Electronically Signed   By: Genevie Ann M.D.   On: 04/02/2014 15:18     MDM   1. Contusion, chest wall, right, subsequent encounter    69 year old female here with continued pain appropriate for chest wall contusion. X-rays of the chest today  are negative for fracture. We have discussed results with her and prescribed small amount of tramadol for pain.  Pt seen and discussed with Dr. Juventino Slovak and he agrees with the plan as discussed above.      Timmothy Euler, MD 04/02/14 [phone]

## 2014-04-02 NOTE — Discharge Instructions (Signed)

## 2014-04-02 NOTE — ED Provider Notes (Signed)
CSN: [id number]     Arrival date & time 04/02/14  1306 History   First MD Initiated Contact with Patient 04/02/14 1432     Chief Complaint  Patient presents with  . Fall   (Consider location/radiation/quality/duration/timing/severity/associated sxs/prior Treatment) HPI  Past Medical History  Diagnosis Date  . Hypertension   . Insomnia   . Vertigo   . Migraine   . Thyroid nodule 2010    TSH 04/2008 = 0.296, no follow up noted in [facility]  . HLD (hyperlipidemia)   . Psychosis     followed by Dr. Keenan Bachelor [phone]  . Acid reflux   . Back pain   . Osteoarthritis     knee   Past Surgical History  Procedure Laterality Date  . Uterine fibroid surgery    . Tonsillectomy     Family History  Problem Relation Age of Onset  . Stroke Neg Hx   . Heart disease Neg Hx   . Cancer Neg Hx   . Colon cancer Neg Hx    History  Substance Use Topics  . Smoking status: Never Smoker   . Smokeless tobacco: Never Used  . Alcohol Use: No   OB History    No data available     Review of Systems  Allergies  Lidocaine and Olanzapine  Home Medications   Prior to Admission medications   Medication Sig Start Date End Date Taking? Authorizing Provider  amLODipine (NORVASC) 5 MG tablet Take 1 tablet (5 mg total) by mouth daily. 03/25/14   Clinton Gallant, MD  calcium carbonate (OS-CAL - DOSED IN MG OF ELEMENTAL CALCIUM) 1250 MG tablet Take 1 tablet by mouth daily.    Historical Provider, MD  DULoxetine (CYMBALTA) 20 MG capsule Take 20 mg by mouth daily.    Historical Provider, MD  PATADAY 0.2 % SOLN  10/18/13   Historical Provider, MD  quinapril (ACCUPRIL) 5 MG tablet Take 1 tablet (5 mg total) by mouth daily. 03/25/14   Clinton Gallant, MD  ranitidine (ZANTAC) 150 MG tablet Take 1 tablet (150 mg total) by mouth 2 (two) times daily. 03/25/14   Clinton Gallant, MD  simvastatin (ZOCOR) 20 MG tablet TAKE 1 TABLET (20 MG TOTAL) BY MOUTH AT BEDTIME. 03/25/14   Clinton Gallant, MD   BP 189/95 mmHg  Pulse 101  Temp(Src)  99.3 F (37.4 C) (Oral)  Resp 16  SpO2 98%  LMP 03/27/1984 Physical Exam  ED Course  Procedures (including critical care time) Labs Review Labs Reviewed - No data to display  Imaging Review Dg Chest 2 View  04/02/2014   CLINICAL DATA:  69 year old female who fell 2 weeks ago with continued central and left chest pain radiating to both upper extremities. Shortness of Breath. Initial encounter.  EXAM: CHEST  2 VIEW  COMPARISON:  07/27/2013 and earlier.  FINDINGS: Stable lung volumes. Normal cardiac size and mediastinal contours. Visualized tracheal air column is within normal limits. No pneumothorax or pleural effusion. No acute pulmonary opacity. Stable and normal anterior clear space. No displaced rib fracture identified.  IMPRESSION: No acute cardiopulmonary abnormality or acute traumatic injury identified.   Electronically Signed   By: Genevie Ann M.D.   On: 04/02/2014 15:18     MDM  No diagnosis found. Pt seen and examined and reassured with resident, dx and plans as noted.    Billy Fischer, MD 04/02/14 [phone]

## 2014-04-03 ENCOUNTER — Other Ambulatory Visit: Payer: Self-pay | Admitting: Internal Medicine

## 2014-04-04 ENCOUNTER — Telehealth: Payer: Self-pay | Admitting: *Deleted

## 2014-04-04 NOTE — Telephone Encounter (Signed)
Pt calls and requests phenergan suppositories for N&V x 2 days. She is advised that since she had a recent fall she may need to be seen because the phenergan may make her unsteady and if she has been having N&V x 2 days it could be something besides a viral episode. Ask her to go to urg care and she refuses. Finally agrees to come in fri for appt, 1345 dr Algis Liming, she also states that her chart must be flagged that she is a "crack head" because she ask for pain medicine after her fall and several years ago when she had a kidney stone, she states her daughter told her she was sure that had happened, she was reassured that was not the case and we would see her tomorrow.

## 2014-04-05 ENCOUNTER — Encounter: Payer: Self-pay | Admitting: Internal Medicine

## 2014-04-05 ENCOUNTER — Ambulatory Visit (INDEPENDENT_AMBULATORY_CARE_PROVIDER_SITE_OTHER): Payer: Medicare Other | Admitting: Internal Medicine

## 2014-04-05 VITALS — BP 152/87 | HR 99 | Temp 97.9°F | Ht 64.0 in | Wt 141.4 lb

## 2014-04-05 DIAGNOSIS — R110 Nausea: Secondary | ICD-10-CM

## 2014-04-05 MED ORDER — ONDANSETRON 4 MG PO TBDP: 4.0000 mg | ORAL_TABLET | Freq: Three times a day (TID) | ORAL | Status: DC | PRN

## 2014-04-05 NOTE — Assessment & Plan Note (Signed)
This is likely in the setting of recent increased NSAID use. The patient has known GERD and not taking any of her antacids. The patient refuses full review of systems or physical exam but would like symptoms under control. Pt vitals stable and overall per chart review from recent ED evaluation were essentially normal.  -Zofran sublingual -Follow-up if symptoms persist she was educated on warning signs that would need immediate evaluation

## 2014-04-05 NOTE — Progress Notes (Signed)
Subjective:   Patient ID: Doris [name] female   DOB: 05/18/1945 69 y.o.   MRN: [id number]  HPI: [name] is a 69 y.o. woman past medical history as listed below presents for some ongoing nausea. The patient states that ever since she had her chest contusion she has increased her daily use of ibuprofen which has sent her into a "stomach pain cycle." She has not taken any of the antacids prescribed or over-the-counter medication. She is also not taken any of her medications for the past week including her psychiatric meds. She denies any active hallucinations or suicidal ideation.  The patient is very angry during the visit and at this provider stating "I'm not bother anybody anymore you all have labeled me some crazy black crack head and so I can't get what I want any you never give me what I need."   Past Medical History  Diagnosis Date  . Hypertension   . Insomnia   . Vertigo   . Migraine   . Thyroid nodule 2010    TSH 04/2008 = 0.296, no follow up noted in [facility]  . HLD (hyperlipidemia)   . Psychosis     followed by Dr. Keenan Bachelor [phone]  . Acid reflux   . Back pain   . Osteoarthritis     knee   Current Outpatient Prescriptions  Medication Sig Dispense Refill  . amLODipine (NORVASC) 5 MG tablet Take 1 tablet (5 mg total) by mouth daily. 90 tablet 3  . amLODipine (NORVASC) 5 MG tablet TAKE 1 TABLET BY MOUTH DAILY 30 tablet 2  . calcium carbonate (OS-CAL - DOSED IN MG OF ELEMENTAL CALCIUM) 1250 MG tablet Take 1 tablet by mouth daily.    . DULoxetine (CYMBALTA) 20 MG capsule Take 20 mg by mouth daily.    . ondansetron (ZOFRAN ODT) 4 MG disintegrating tablet Take 1 tablet (4 mg total) by mouth every 8 (eight) hours as needed for nausea or vomiting. 20 tablet 0  . PATADAY 0.2 % SOLN     . quinapril (ACCUPRIL) 5 MG tablet Take 1 tablet (5 mg total) by mouth daily. 90 tablet 3  . ranitidine (ZANTAC) 150 MG tablet Take 1 tablet (150 mg total) by mouth 2 (two) times  daily. 90 tablet 3  . simvastatin (ZOCOR) 20 MG tablet TAKE 1 TABLET (20 MG TOTAL) BY MOUTH AT BEDTIME. 90 tablet 3  . traMADol (ULTRAM) 50 MG tablet Take 1 tablet (50 mg total) by mouth every 6 (six) hours as needed. 30 tablet 0   No current facility-administered medications for this visit.   Family History  Problem Relation Age of Onset  . Stroke Neg Hx   . Heart disease Neg Hx   . Cancer Neg Hx   . Colon cancer Neg Hx    History   Social History  . Marital Status: Divorced    Spouse Name: N/A  . Number of Children: N/A  . Years of Education: N/A   Social History Main Topics  . Smoking status: Never Smoker   . Smokeless tobacco: Never Used  . Alcohol Use: No  . Drug Use: No  . Sexual Activity: No   Other Topics Concern  . None   Social History Narrative   Review of Systems: pt refuses to participate in full ROS Objective:  Physical Exam: Filed Vitals:   04/05/14 1342  BP: 152/87  Pulse: 99  Temp: 97.9 F (36.6 C)  TempSrc: Oral  Height: 5\' 4"  (1.626 m)  Weight: 141 lb 6.4 oz (64.139 kg)  SpO2: 100%   Pt refused exam  Assessment & Plan:  Please see problem oriented charting  Pt discussed with Dr. Dareen Piano

## 2014-04-08 NOTE — Progress Notes (Signed)
INTERNAL MEDICINE TEACHING ATTENDING ADDENDUM - Anaisha Mago, MD: I reviewed and discussed at the time of visit with the resident Dr. Sadek, the patient's medical history, physical examination, diagnosis and results of pertinent tests and treatment and I agree with the patient's care as documented.  

## 2014-04-20 DIAGNOSIS — R0789 Other chest pain: Secondary | ICD-10-CM | POA: Diagnosis not present

## 2014-04-22 ENCOUNTER — Other Ambulatory Visit: Payer: Self-pay | Admitting: Orthopedic Surgery

## 2014-04-22 DIAGNOSIS — R0789 Other chest pain: Secondary | ICD-10-CM

## 2014-04-25 ENCOUNTER — Ambulatory Visit
Admission: RE | Admit: 2014-04-25 | Discharge: 2014-04-25 | Disposition: A | Discharge status: Home / Self Care | Payer: Medicare Other | Source: Ambulatory Visit | Attending: Orthopedic Surgery | Admitting: Orthopedic Surgery

## 2014-04-25 DIAGNOSIS — R0789 Other chest pain: Secondary | ICD-10-CM

## 2014-04-25 DIAGNOSIS — J8489 Other specified interstitial pulmonary diseases: Secondary | ICD-10-CM | POA: Diagnosis not present

## 2014-05-02 ENCOUNTER — Other Ambulatory Visit: Payer: Self-pay | Admitting: *Deleted

## 2014-05-02 DIAGNOSIS — I10 Essential (primary) hypertension: Secondary | ICD-10-CM

## 2014-05-02 DIAGNOSIS — K219 Gastro-esophageal reflux disease without esophagitis: Secondary | ICD-10-CM

## 2014-05-02 NOTE — Telephone Encounter (Signed)
Pt's insurance has changed her pharmacy to mail order and needs new scripts

## 2014-05-03 MED ORDER — SIMVASTATIN 20 MG PO TABS: ORAL_TABLET | ORAL | Status: DC

## 2014-05-03 MED ORDER — RANITIDINE HCL 150 MG PO TABS: 150.0000 mg | ORAL_TABLET | Freq: Two times a day (BID) | ORAL | Status: DC

## 2014-05-03 MED ORDER — QUINAPRIL HCL 5 MG PO TABS: 5.0000 mg | ORAL_TABLET | Freq: Every day | ORAL | Status: DC

## 2014-05-03 MED ORDER — AMLODIPINE BESYLATE 5 MG PO TABS: 5.0000 mg | ORAL_TABLET | Freq: Every day | ORAL | Status: DC

## 2014-05-04 DIAGNOSIS — S2222XD Fracture of body of sternum, subsequent encounter for fracture with routine healing: Secondary | ICD-10-CM | POA: Diagnosis not present

## 2014-05-04 DIAGNOSIS — R0789 Other chest pain: Secondary | ICD-10-CM | POA: Diagnosis not present

## 2014-05-13 DIAGNOSIS — F419 Anxiety disorder, unspecified: Secondary | ICD-10-CM | POA: Diagnosis not present

## 2014-05-13 DIAGNOSIS — F331 Major depressive disorder, recurrent, moderate: Secondary | ICD-10-CM | POA: Diagnosis not present

## 2014-05-15 DIAGNOSIS — H01021 Squamous blepharitis right upper eyelid: Secondary | ICD-10-CM | POA: Diagnosis not present

## 2014-05-15 DIAGNOSIS — H01024 Squamous blepharitis left upper eyelid: Secondary | ICD-10-CM | POA: Diagnosis not present

## 2014-05-15 DIAGNOSIS — H04123 Dry eye syndrome of bilateral lacrimal glands: Secondary | ICD-10-CM | POA: Diagnosis not present

## 2014-05-15 DIAGNOSIS — H10413 Chronic giant papillary conjunctivitis, bilateral: Secondary | ICD-10-CM | POA: Diagnosis not present

## 2014-05-15 DIAGNOSIS — H01025 Squamous blepharitis left lower eyelid: Secondary | ICD-10-CM | POA: Diagnosis not present

## 2014-05-15 DIAGNOSIS — H01022 Squamous blepharitis right lower eyelid: Secondary | ICD-10-CM | POA: Diagnosis not present

## 2014-05-17 ENCOUNTER — Encounter: Payer: Self-pay | Admitting: Internal Medicine

## 2014-05-17 ENCOUNTER — Ambulatory Visit (INDEPENDENT_AMBULATORY_CARE_PROVIDER_SITE_OTHER): Payer: Medicare Other | Admitting: Internal Medicine

## 2014-05-17 VITALS — BP 141/98 | HR 113 | Temp 98.4°F | Ht 63.0 in | Wt 146.7 lb

## 2014-05-17 DIAGNOSIS — R938 Abnormal findings on diagnostic imaging of other specified body structures: Secondary | ICD-10-CM | POA: Diagnosis not present

## 2014-05-17 DIAGNOSIS — E785 Hyperlipidemia, unspecified: Secondary | ICD-10-CM | POA: Diagnosis not present

## 2014-05-17 DIAGNOSIS — I10 Essential (primary) hypertension: Secondary | ICD-10-CM

## 2014-05-17 DIAGNOSIS — K8020 Calculus of gallbladder without cholecystitis without obstruction: Secondary | ICD-10-CM | POA: Insufficient documentation

## 2014-05-17 DIAGNOSIS — Z9289 Personal history of other medical treatment: Secondary | ICD-10-CM

## 2014-05-17 DIAGNOSIS — Z1159 Encounter for screening for other viral diseases: Secondary | ICD-10-CM | POA: Diagnosis not present

## 2014-05-17 DIAGNOSIS — S2220XD Unspecified fracture of sternum, subsequent encounter for fracture with routine healing: Secondary | ICD-10-CM | POA: Diagnosis not present

## 2014-05-17 DIAGNOSIS — R9389 Abnormal findings on diagnostic imaging of other specified body structures: Secondary | ICD-10-CM

## 2014-05-17 MED ORDER — QUINAPRIL HCL 10 MG PO TABS: 10.0000 mg | ORAL_TABLET | Freq: Every day | ORAL | Status: DC

## 2014-05-17 MED ORDER — ACETAMINOPHEN-CODEINE #3 300-30 MG PO TABS
1.0000 | ORAL_TABLET | ORAL | Status: DC | PRN
Start: 2014-05-17 — End: 2014-06-27

## 2014-05-17 NOTE — Patient Instructions (Signed)
General Instructions: Call if you have questions.  Please bring your medicines with you each time you come to clinic.  Medicines may include prescription medications, over-the-counter medications, herbal remedies, eye drops, vitamins, or other pills.   Progress Toward Treatment Goals:  Treatment Goal 11/26/2013  Blood pressure deteriorated    Self Care Goals & Plans:  Self Care Goal 05/17/2014  Manage my medications take my medicines as prescribed; bring my medications to every visit; refill my medications on time  Monitor my health -  Eat healthy foods eat more vegetables; eat foods that are low in salt; eat baked foods instead of fried foods  Be physically active -  Other -    No flowsheet data found.   Care Management & Community Referrals:  Referral 05/01/2012  Referrals made for care management support none needed

## 2014-05-18 LAB — LIPID PANEL
CHOL/HDL RATIO: 2.9 ratio
Cholesterol: 196 mg/dL (ref 0–200)
HDL: 68 mg/dL (ref >=46)
LDL Cholesterol: 94 mg/dL (ref 0–99)
Triglycerides: 168 mg/dL — ABNORMAL HIGH (ref <150)
VLDL: 34 mg/dL (ref 0–40)

## 2014-05-18 LAB — HEPATITIS C ANTIBODY: HCV Ab: NEGATIVE

## 2014-05-19 DIAGNOSIS — R9389 Abnormal findings on diagnostic imaging of other specified body structures: Secondary | ICD-10-CM | POA: Insufficient documentation

## 2014-05-19 NOTE — Assessment & Plan Note (Signed)
-   Repeat Lipid panel

## 2014-05-19 NOTE — Assessment & Plan Note (Signed)
-  Refill Tylenol #3.  Ibuprofen worsened her heartburn.

## 2014-05-19 NOTE — Progress Notes (Signed)
[facility] Subjective:   Patient ID: Doris [name] female   DOB: [birth year] 69 y.o.   MRN: [id number]  HPI: Doris [name] is a 69 y.o. female with a PMH detailed below who presents to have the results of a CT of her chest explained to her.  A little over a month ago she had a fall onto her chest for which she was seen at urgent care, she was prescribed some ibuprofen but that did not fully resolve her pain. She subsequently went to her orthopedist Dr. Gladstone Lighter for a few visits, eventually a CT of her chest was ordered which showed a sternal fracture.  She was told about this but then received the written results and is concerned about the other findings. For her sternal fracture she reports she is doing much better but still has some pain when moving in certain directions.  She reports she only has a few pills of Tylenol #3 remaining.  She is requesting a refill.   She reports she has seen commericals on TV about hepatitis C and notes she had a blood transfusion in the 80s, she would like to be screened for Hep C. Past Medical History  Diagnosis Date  . Hypertension   . Insomnia   . Vertigo   . Migraine   . Thyroid nodule 2010    TSH 04/2008 = 0.296, no follow up noted in [facility]  . HLD (hyperlipidemia)   . Psychosis     followed by Dr. Keenan Bachelor [phone]  . Acid reflux   . Back pain   . Osteoarthritis     knee   Current Outpatient Prescriptions  Medication Sig Dispense Refill  . acetaminophen-codeine (TYLENOL #3) 300-30 MG per tablet Take 1 tablet by mouth every 4 (four) hours as needed for moderate pain. 30 tablet 0  . amLODipine (NORVASC) 5 MG tablet Take 1 tablet (5 mg total) by mouth daily. 90 tablet 3  . calcium carbonate (OS-CAL - DOSED IN MG OF ELEMENTAL CALCIUM) 1250 MG tablet Take 1 tablet by mouth daily.    . DULoxetine (CYMBALTA) 20 MG capsule Take 20 mg by mouth daily.    . ondansetron (ZOFRAN ODT) 4 MG disintegrating tablet Take 1 tablet  (4 mg total) by mouth every 8 (eight) hours as needed for nausea or vomiting. 20 tablet 0  . PATADAY 0.2 % SOLN     . quinapril (ACCUPRIL) 10 MG tablet Take 1 tablet (10 mg total) by mouth daily. 90 tablet 3  . ranitidine (ZANTAC) 150 MG tablet Take 1 tablet (150 mg total) by mouth 2 (two) times daily. 90 tablet 3  . simvastatin (ZOCOR) 20 MG tablet TAKE 1 TABLET (20 MG TOTAL) BY MOUTH AT BEDTIME. 90 tablet 3   No current facility-administered medications for this visit.   Family History  Problem Relation Age of Onset  . Stroke Neg Hx   . Heart disease Neg Hx   . Cancer Neg Hx   . Colon cancer Neg Hx    History   Social History  . Marital Status: Divorced    Spouse Name: N/A  . Number of Children: N/A  . Years of Education: N/A   Social History Main Topics  . Smoking status: Never Smoker   . Smokeless tobacco: Never Used  . Alcohol Use: No  . Drug Use: No  . Sexual Activity: No   Other Topics Concern  . None   Social History Narrative   Review of Systems: Review  of Systems  Constitutional: Negative for fever and chills.  Eyes: Negative for blurred vision.  Respiratory: Negative for cough and shortness of breath.   Cardiovascular: Positive for chest pain. Negative for leg swelling.  Gastrointestinal: Negative for heartburn, abdominal pain, diarrhea and constipation.  Genitourinary: Negative for dysuria.  Musculoskeletal: Negative for myalgias.  Neurological: Negative for dizziness and headaches.  Psychiatric/Behavioral: Negative for depression and suicidal ideas.     Objective:  Physical Exam: Filed Vitals:   05/17/14 1449  BP: 141/98  Pulse: 113  Temp: 98.4 F (36.9 C)  TempSrc: Oral  Height: 5\' 3"  (1.6 m)  Weight: 146 lb 11.2 oz (66.543 kg)  SpO2: 99%  Physical Exam  Constitutional: She is well-developed, well-nourished, and in no distress.  HENT:  Head: Normocephalic and atraumatic.  Eyes: Conjunctivae are normal.  Cardiovascular: Normal rate, regular  rhythm and normal heart sounds.   Pulmonary/Chest: Effort normal and breath sounds normal. No respiratory distress. She has no wheezes.  Tenderness to palpation of sternum  Abdominal: Soft. Bowel sounds are normal.  Musculoskeletal: She exhibits no edema.  Nursing note and vitals reviewed.   Assessment & Plan:  Case discussed with Dr. Daryll Drown  I spent 40 minutes with Doris [name] of which greater than 50% of the time was spent on counseling and coordination  of care.  Essential hypertension BP Readings from Last 3 Encounters:  05/17/14 141/98  04/05/14 152/87  04/02/14 189/95    Lab Results  Component Value Date   NA 140 12/20/2012   K 3.4* 12/20/2012   CREATININE 0.67 12/20/2012    Assessment: Blood pressure control:  mildly above goal Progress toward BP goal:   unchanged Comments:   Plan: Medications:  Increase quinapril to 10mg  daily, continue amlodipine 5mg  daily Educational resources provided:   Self management tools provided:   Other plans:    Abnormal CT scan, chest I discussed with her the abnormal findings of her CT of her chest.   Notably: Calcified atherosclerotic plaque: we are working on her cholesterol, will recheck lipid panel today Small hiatal hernia: likely contributes to her heartburn.  No surgical intervention Calcified granuloma: has been seen previously on her Xrays thought to be related to her latent TB. 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 Liver lison: this is favored to be a cyst and is smaller than on her CT in 2012. Tiny angiomyolipoma in left kidney>>no intervention Cholelithiasis>> no evidence of obstruction, discussed that she can elect for elective Sx but without symptoms does not need.   Sternal fracture -Refill Tylenol #3.  Ibuprofen worsened her heartburn.   Hyperlipidemia - Repeat Lipid panel     Medications  Ordered Meds ordered this encounter  Medications  . quinapril (ACCUPRIL) 10 MG tablet    Sig: Take 1 tablet (10 mg total) by mouth daily.    Dispense:  90 tablet    Refill:  3  . acetaminophen-codeine (TYLENOL #3) 300-30 MG per tablet    Sig: Take 1 tablet by mouth every 4 (four) hours as needed for moderate pain.    Dispense:  30 tablet    Refill:  0   Other Orders Orders Placed This Encounter  Procedures  . Hepatitis C Antibody  . Lipid Profile

## 2014-05-19 NOTE — Assessment & Plan Note (Signed)
BP Readings from Last 3 Encounters:  05/17/14 141/98  04/05/14 152/87  04/02/14 189/95    Lab Results  Component Value Date   NA 140 12/20/2012   K 3.4* 12/20/2012   CREATININE 0.67 12/20/2012    Assessment: Blood pressure control:  mildly above goal Progress toward BP goal:   unchanged Comments:   Plan: Medications:  Increase quinapril to 10mg  daily, continue amlodipine 5mg  daily Educational resources provided:   Self management tools provided:   Other plans:

## 2014-05-19 NOTE — Assessment & Plan Note (Signed)
I discussed with her the abnormal findings of her CT of her chest.   Notably: Calcified atherosclerotic plaque: we are working on her cholesterol, will recheck lipid panel today Small hiatal hernia: likely contributes to her heartburn.  No surgical intervention Calcified granuloma: has been seen previously on her Xrays thought to be related to her latent TB. 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 Liver lison: this is favored to be a cyst and is smaller than on her CT in 2012. Tiny angiomyolipoma in left kidney>>no intervention Cholelithiasis>> no evidence of obstruction, discussed that she can elect for elective Sx but without symptoms does not need.

## 2014-05-20 NOTE — Progress Notes (Signed)
Internal Medicine Clinic Attending  Case discussed with Dr. Hoffman soon after the resident saw the patient.  We reviewed the resident's history and exam and pertinent patient test results.  I agree with the assessment, diagnosis, and plan of care documented in the resident's note. 

## 2014-06-26 ENCOUNTER — Encounter: Payer: Self-pay | Admitting: *Deleted

## 2014-06-27 ENCOUNTER — Encounter: Payer: Self-pay | Admitting: Pulmonary Disease

## 2014-06-27 ENCOUNTER — Ambulatory Visit (INDEPENDENT_AMBULATORY_CARE_PROVIDER_SITE_OTHER): Payer: Medicare Other | Admitting: Pulmonary Disease

## 2014-06-27 VITALS — BP 138/80 | HR 96 | Ht 64.0 in | Wt 144.0 lb

## 2014-06-27 DIAGNOSIS — R918 Other nonspecific abnormal finding of lung field: Secondary | ICD-10-CM | POA: Diagnosis not present

## 2014-06-27 DIAGNOSIS — M62838 Other muscle spasm: Secondary | ICD-10-CM

## 2014-06-27 DIAGNOSIS — R000 Tachycardia, unspecified: Secondary | ICD-10-CM | POA: Diagnosis not present

## 2014-06-27 MED ORDER — CYCLOBENZAPRINE HCL 10 MG PO TABS: 10.0000 mg | ORAL_TABLET | Freq: Three times a day (TID) | ORAL | Status: DC | PRN

## 2014-06-27 NOTE — Patient Instructions (Addendum)
Flexeril 10 mg three times per day as needed for muscle spasms Follow up as needed

## 2014-06-27 NOTE — Progress Notes (Signed)
Chief Complaint  Patient presents with  . PULMONARY CONSULT    Referred by Dr Gladstone Lighter for lung nodule(right). Recent CT 04/2014.     History of Present Illness: Doris [name] is a 69 y.o. female for evaluation of pulmonary nodule.  She fell in February.  She hit her chest on a table, and suffered a sternal injury.  During this evaluation she had a CT chest.  This showed several small nodules.  As a result she was referred to pulmonary.  She has never smoked.  She worked in health care.  She denies 2nd hand tobacco exposure.  She is from [place], but has also lived in [place] and [place].  She denies exposure to birds.  She does not have pets.  There is no history of pneumonia, tuberculosis, asthma.  She denies cough, wheeze, sputum, fever, hemoptysis, joint swelling, gland swelling, or skin rash.  She has annual mammogram and reports this is normal.  She denies vaginal bleeding or rectal bleeding.  She has noticed episodes of palpitations.  She also gets a feeling like her chest muscles are wound up when she does yoga >> this started after she had her sternal injury.   Doris [name]  has a past medical history of Hypertension; Insomnia; Vertigo; Migraine; Thyroid nodule (2010); HLD (hyperlipidemia); Psychosis; Acid reflux; Back pain; and Osteoarthritis.  Doris [name]  has past surgical history that includes Uterine fibroid surgery and Tonsillectomy.  Prior to Admission medications   Medication Sig Start Date End Date Taking? Authorizing Provider  ALPRAZolam Duanne Moron) 0.25 MG tablet Take 0.25 mg by mouth at bedtime as needed for anxiety.   Yes Historical Provider, MD  amLODipine (NORVASC) 5 MG tablet Take 1 tablet (5 mg total) by mouth daily. 05/03/14  Yes Jerrye Noble, MD  Biotin 1000 MCG tablet Take 3,000 mcg by mouth daily.   Yes Historical Provider, MD  calcium carbonate (OS-CAL - DOSED IN MG OF ELEMENTAL CALCIUM) 1250 MG tablet Take 1 tablet by mouth daily.   Yes Historical Provider,  MD  cyanocobalamin 100 MCG tablet Take 100 mcg by mouth daily.   Yes Historical Provider, MD  DULoxetine (CYMBALTA) 20 MG capsule Take 20 mg by mouth daily.   Yes Historical Provider, MD  PATADAY 0.2 % SOLN  10/18/13  Yes Historical Provider, MD  quinapril (ACCUPRIL) 10 MG tablet Take 1 tablet (10 mg total) by mouth daily. 05/17/14  Yes Lucious Groves, DO  ranitidine (ZANTAC) 150 MG tablet Take 1 tablet (150 mg total) by mouth 2 (two) times daily. 05/03/14  Yes Jerrye Noble, MD  simvastatin (ZOCOR) 20 MG tablet TAKE 1 TABLET (20 MG TOTAL) BY MOUTH AT BEDTIME. 05/03/14  Yes Jerrye Noble, MD    Allergies  Allergen Reactions  . Lidocaine     REACTION: seizure  . Olanzapine     REACTION: unknown reaction    Her family history includes Heart disease in her other. There is no history of Stroke, Cancer, or Colon cancer.  She  reports that she has never smoked. She has never used smokeless tobacco. She reports that she does not drink alcohol or use illicit drugs.  Review of Systems  Constitutional: Positive for unexpected weight change. Negative for fever.  HENT: Negative for congestion, dental problem, ear pain, nosebleeds, postnasal drip, rhinorrhea, sinus pressure, sneezing, sore throat and trouble swallowing.   Eyes: Negative for redness and itching.  Respiratory: Positive for shortness of breath ( rest/activity). Negative for cough, chest tightness and  wheezing.   Cardiovascular: Negative for palpitations and leg swelling.  Gastrointestinal: Positive for abdominal pain. Negative for nausea and vomiting.       Indigestion  Genitourinary: Negative for dysuria.  Musculoskeletal: Positive for joint swelling.  Skin: Negative for rash.  Neurological: Negative for headaches.  Hematological: Does not bruise/bleed easily.  Psychiatric/Behavioral: Negative for dysphoric mood. The patient is not nervous/anxious.    Physical Exam: Blood pressure 138/80, pulse 96, height 5\' 4"  (1.626 m), weight 144 lb  (65.318 kg), last menstrual period 03/27/1984, SpO2 98 %. Body mass index is 24.71 kg/(m^2).  General - No distress ENT - No sinus tenderness, no oral exudate, no LAN, no thyromegaly, TM clear, pupils equal/reactive Cardiac - s1s2 regular, no murmur, pulses symmetric Chest - No wheeze/rales/dullness, good air entry, normal respiratory excursion Back - No focal tenderness Abd - Soft, non-tender, no organomegaly, + bowel sounds Ext - No edema Neuro - Normal strength, cranial nerves intact Skin - No rashes Psych - Normal mood, and behavior   Ct Chest Wo Contrast  04/25/2014   CLINICAL DATA:  70 year old female with history of sternal pain after a fall 5 weeks ago. Pain radiates into both shoulders and arms. Patient reports pain during inspiration and while driving.  EXAM: CT CHEST WITHOUT CONTRAST  TECHNIQUE: Multidetector CT imaging of the chest was performed following the standard protocol without IV contrast.  COMPARISON:  No priors.  FINDINGS: Mediastinum/Lymph Nodes: Heart size is normal. There is no significant pericardial fluid, thickening or pericardial calcification. There is atherosclerosis of the thoracic aorta, the great vessels of the mediastinum and the coronary arteries, including calcified atherosclerotic plaque in the left anterior descending and right coronary arteries. No pathologically enlarged mediastinal or hilar lymph nodes. Please note that accurate exclusion of hilar adenopathy is limited on noncontrast CT scans. Small hiatal hernia. No axillary lymphadenopathy.  Lungs/Pleura: Small calcified granuloma in the right lower lobe. Other tiny 2-3 mm nodules in the periphery of the right lung in both the right middle and lower lobes (images 37 and 33 of series 4), highly nonspecific, but favored to reflect areas of distal mucoid impaction in terminal bronchioles. No larger more suspicious appearing pulmonary nodules or masses are otherwise noted. No acute consolidative airspace  disease. No pleural effusions. No pneumothorax.  Upper Abdomen: The gallbladder is completely contracted and there are innumerable calcified gallstones filling the lumen. In segment 4 of the liver there is a 4.9 x 3.8 cm well-defined low-attenuation lesion, which is slightly bigger than prior study 11/17/2010, and although incompletely characterized, is favored to represent a cyst. Exophytic 3.8 x 3.2 cm low-attenuation lesion in the upper pole of the left kidney is also similar to the prior study, and although incompletely characterized is favored to reflect a tiny cyst. 10 mm fatty attenuation lesion in the posterior aspect of the interpolar region of the left kidney is similar to the prior examination, compatible with a tiny angiomyolipoma. Atherosclerosis.  Musculoskeletal/Soft Tissues: Focal wall anterior cortical discontinuity in the upper sternum immediately caudal to the sternomanubrial joint, with surrounding sclerosis, compatible with a healing nondisplaced fracture.  IMPRESSION: 1. Healing nondisplaced sternal fracture, as above. 2. No other signs of significant acute traumatic injury to the thorax. 3. Atherosclerosis, including two vessel coronary artery disease. Please note that although the presence of coronary artery calcium documents the presence of coronary artery disease, the severity of this disease and any potential stenosis cannot be assessed on this non-gated CT examination. Assessment for potential risk  factor modification, dietary therapy or pharmacologic therapy may be warranted, if clinically indicated. 4. Tiny 2-3 mm nodules in the periphery of the right lung are strongly favored to be benign areas of mucoid impaction, but are nonspecific. If the patient is at high risk for bronchogenic carcinoma, follow-up chest CT at 1 year is recommended. If the patient is at low risk, no follow-up is needed. This recommendation follows the consensus statement: Guidelines for Management of Small  Pulmonary Nodules Detected on CT Scans: A Statement from the [place] as published in Radiology 2005; 237:395-400. 5. Cholelithiasis without evidence of acute cholecystitis at this time. 6. 10 mm angiomyolipoma in the interpolar region of the left kidney. 7. Additional incidental findings, as above.   Electronically Signed   By: Vinnie Langton M.D.   On: 04/25/2014 17:31    Lab Results  Component Value Date   WBC 9.4 12/20/2012   HGB 14.0 12/20/2012   HCT 41.3 12/20/2012   MCV 86.4 12/20/2012   PLT 170 12/20/2012    Lab Results  Component Value Date   CREATININE 0.67 12/20/2012   BUN 10 12/20/2012   NA 140 12/20/2012   K 3.4* 12/20/2012   CL 102 12/20/2012   CO2 28 12/20/2012    Lab Results  Component Value Date   ALT 12 12/09/2011   AST 16 12/09/2011   ALKPHOS 78 12/09/2011   BILITOT 0.3 12/09/2011    Lab Results  Component Value Date   TSH 0.106* 11/24/2010    Assessment/Plan:  Pulmonary nodule. This was incidental finding.  She does not have any risk factors that would put her at increased risk for malignancy.  Reviewed CT images with her. Plan: - no additional radiographic follow up needed  Palpitations. ECG today showed normal sinus rhythm. Plan: - advised her to f/u with her PCP  Chest wall muscle spasm. Plan: - can try prn flexeril >> advised she would need to get future refills from her PCP   Chesley Mires, MD [facility] Pager:  [phone]

## 2014-06-27 NOTE — Progress Notes (Deleted)
   Subjective:    Patient ID: Doris [name], female    DOB: 1946-02-14, 69 y.o.   MRN: [id number]  HPI    Review of Systems  Constitutional: Positive for unexpected weight change. Negative for fever.  HENT: Negative for congestion, dental problem, ear pain, nosebleeds, postnasal drip, rhinorrhea, sinus pressure, sneezing, sore throat and trouble swallowing.   Eyes: Negative for redness and itching.  Respiratory: Positive for shortness of breath ( rest/activity). Negative for cough, chest tightness and wheezing.   Cardiovascular: Negative for palpitations and leg swelling.  Gastrointestinal: Positive for abdominal pain. Negative for nausea and vomiting.       Indigestion  Genitourinary: Negative for dysuria.  Musculoskeletal: Positive for joint swelling.  Skin: Negative for rash.  Neurological: Negative for headaches.  Hematological: Does not bruise/bleed easily.  Psychiatric/Behavioral: Negative for dysphoric mood. The patient is not nervous/anxious.        Objective:   Physical Exam        Assessment & Plan:

## 2014-07-11 DIAGNOSIS — F411 Generalized anxiety disorder: Secondary | ICD-10-CM | POA: Diagnosis not present

## 2014-07-11 DIAGNOSIS — F331 Major depressive disorder, recurrent, moderate: Secondary | ICD-10-CM | POA: Diagnosis not present

## 2014-08-12 ENCOUNTER — Other Ambulatory Visit: Payer: Self-pay

## 2014-08-26 ENCOUNTER — Ambulatory Visit (INDEPENDENT_AMBULATORY_CARE_PROVIDER_SITE_OTHER): Payer: Medicare Other | Admitting: Internal Medicine

## 2014-08-26 ENCOUNTER — Encounter: Payer: Self-pay | Admitting: Internal Medicine

## 2014-08-26 VITALS — BP 122/74 | HR 95 | Temp 98.6°F | Ht 64.0 in | Wt 148.7 lb

## 2014-08-26 DIAGNOSIS — E038 Other specified hypothyroidism: Secondary | ICD-10-CM | POA: Diagnosis present

## 2014-08-26 DIAGNOSIS — R000 Tachycardia, unspecified: Secondary | ICD-10-CM

## 2014-08-26 DIAGNOSIS — E0590 Thyrotoxicosis, unspecified without thyrotoxic crisis or storm: Secondary | ICD-10-CM

## 2014-08-26 DIAGNOSIS — R002 Palpitations: Secondary | ICD-10-CM | POA: Diagnosis not present

## 2014-08-26 LAB — CBC
HCT: 43.9 % (ref 36.0–46.0)
Hemoglobin: 15 g/dL (ref 12.0–15.0)
MCH: 29.5 pg (ref 26.0–34.0)
MCHC: 34.2 g/dL (ref 30.0–36.0)
MCV: 86.2 fL (ref 78.0–100.0)
MPV: 11 fL (ref 8.6–12.4)
Platelets: 183 10*3/uL (ref 150–400)
RBC: 5.09 MIL/uL (ref 3.87–5.11)
RDW: 14.9 % (ref 11.5–15.5)
WBC: 6 10*3/uL (ref 4.0–10.5)

## 2014-08-26 LAB — T3, FREE: T3, Free: 2.7 pg/mL (ref 2.3–4.2)

## 2014-08-26 LAB — T4, FREE: Free T4: 0.92 ng/dL (ref 0.80–1.80)

## 2014-08-26 LAB — TSH: TSH: 0.165 u[IU]/mL — ABNORMAL LOW (ref 0.350–4.500)

## 2014-08-26 NOTE — Progress Notes (Signed)
Subjective:    Patient ID: Doris [name], female    DOB: 09-08-1945, 69 y.o.   MRN: [id number]  HPI Comments: Doris [name] is a 69 year old woman with PMH as below here with c/o fast heart rate x 6 months.  Please see problem based charting for assessment and plan.       Past Medical History  Diagnosis Date  . Hypertension   . Insomnia   . Vertigo   . Migraine   . Thyroid nodule 2010    TSH 04/2008 = 0.296, no follow up noted in [facility]  . HLD (hyperlipidemia)   . Psychosis     followed by Dr. Keenan [name] [phone]  . Acid reflux   . Back pain   . Osteoarthritis     knee   Current Outpatient Prescriptions on File Prior to Visit  Medication Sig Dispense Refill  . ALPRAZolam (XANAX) 0.25 MG tablet Take 0.25 mg by mouth at bedtime as needed for anxiety.    Marland Kitchen amLODipine (NORVASC) 5 MG tablet Take 1 tablet (5 mg total) by mouth daily. 90 tablet 3  . Biotin 1000 MCG tablet Take 3,000 mcg by mouth daily.    . calcium carbonate (OS-CAL - DOSED IN MG OF ELEMENTAL CALCIUM) 1250 MG tablet Take 1 tablet by mouth daily.    . cyanocobalamin 100 MCG tablet Take 100 mcg by mouth daily.    . cyclobenzaprine (FLEXERIL) 10 MG tablet Take 1 tablet (10 mg total) by mouth 3 (three) times daily as needed for muscle spasms. 30 tablet 0  . DULoxetine (CYMBALTA) 20 MG capsule Take 20 mg by mouth daily.    Marland Kitchen PATADAY 0.2 % SOLN     . quinapril (ACCUPRIL) 10 MG tablet Take 1 tablet (10 mg total) by mouth daily. 90 tablet 3  . ranitidine (ZANTAC) 150 MG tablet Take 1 tablet (150 mg total) by mouth 2 (two) times daily. 90 tablet 3  . simvastatin (ZOCOR) 20 MG tablet TAKE 1 TABLET (20 MG TOTAL) BY MOUTH AT BEDTIME. 90 tablet 3   No current facility-administered medications on file prior to visit.    Review of Systems  Constitutional: Positive for diaphoresis. Negative for appetite change and unexpected weight change.  Cardiovascular: Positive for palpitations.  Gastrointestinal: Positive for  constipation. Negative for nausea, vomiting and diarrhea.  Endocrine: Positive for heat intolerance. Negative for polydipsia and polyphagia.       Hair falling out, dry skin.  Neurological: Negative for tremors.  Psychiatric/Behavioral: Negative for dysphoric mood.       Cymbalta increased 1 month ago for racing thoughts. She feels better on increased dose and is due for follow-up in two weeks.       Filed Vitals:   08/26/14 1031  BP: 122/74  Pulse: 95  Temp: 98.6 F (37 C)  TempSrc: Oral  Height: 5\' 4"  (1.626 m)  Weight: 148 lb 11.2 oz (67.45 kg)  SpO2: 97%     Objective:   Physical Exam  Constitutional: She is oriented to person, place, and time. She appears well-developed. No distress.  HENT:  Head: Normocephalic and atraumatic.  Mouth/Throat: Oropharynx is clear and moist. No oropharyngeal exudate.  Eyes: EOM are normal. Pupils are equal, round, and reactive to light.  Neck: Neck supple. No thyromegaly present.  Cardiovascular: Normal rate, regular rhythm and normal heart sounds.  Exam reveals no gallop and no friction rub.   No murmur heard. Pulmonary/Chest: Effort normal. No respiratory distress. She has no wheezes.  She has no rales.  Abdominal: Soft. Bowel sounds are normal. She exhibits no distension and no mass. There is no tenderness. There is no rebound and no guarding.  Musculoskeletal: Normal range of motion. She exhibits no edema or tenderness.  Neurological: She is alert and oriented to person, place, and time. She displays normal reflexes. No cranial nerve deficit.  Skin: Skin is warm. She is not diaphoretic.  Psychiatric: She has a normal mood and affect. Her behavior is normal. Judgment and thought content normal.  Vitals reviewed.         Assessment & Plan:  Please see problem based charting for assessment and plan.

## 2014-08-26 NOTE — Patient Instructions (Signed)
1. Be sure to stay hydrated. I will call you with your lab results.  I referring you back to Dr. Buddy Duty.  His office will call you.     2. Please take all medications as prescribed.    3. If you have worsening of your symptoms or new symptoms arise, please call the clinic [phone]), or go to the ER immediately if symptoms are severe.   Please return to see you PCP in 2-3 months or sooner if you have problems.

## 2014-08-27 DIAGNOSIS — R000 Tachycardia, unspecified: Secondary | ICD-10-CM | POA: Insufficient documentation

## 2014-08-27 DIAGNOSIS — M170 Bilateral primary osteoarthritis of knee: Secondary | ICD-10-CM | POA: Diagnosis not present

## 2014-08-27 NOTE — Addendum Note (Signed)
Addended by: Francesca Oman on: 08/27/2014 08:53 AM   Modules accepted: Orders, Medications, Level of Service

## 2014-08-27 NOTE — Assessment & Plan Note (Addendum)
She describes fast heart rate but denies feeling skipped beats.  No cardiac hx.  No S&S of volume depletion, infection, MI or HF.  She denies tobacco or drug use.  No recent med changes, except Cymbalta increased to 60mg  by psych last month (symptoms present for about 6 months).  HR upper limit of normal at 95 today.  Feels regular on exam.  HR has consistently been upper 90s - low 100 during OPC visits dating back 2 years.  EKG two months ago NSR.  Given her symptoms and previous thyroid hx (see subclinical hyperthyroidism A&P) this is likely due to overactive thyroid.   - TSH, free T4, free T3 - TSH suppressed, normal T4 and T3 c/w subclinical hyperthyroidism; will refer back to endocrinologist, Dr. Buddy Duty - CBC to assess for anemia

## 2014-08-27 NOTE — Assessment & Plan Note (Addendum)
She lives in a senior building and nurse comes by regularly to check vitals.  She sometimes feels her heart is racing and has been told by nursing that pulse is fast.  EKG done at visit to pulm two months ago was NSR.  She also notes hair loss, alternating diaphoresis and dry skin, alternating heat and cold intolerance, constipation instead of increased bowel frequency.  She has been working on weight loss but has not experienced unintentional weight loss.  She carries dx of subclinical hyperthyroidism and MNG on problem list; previously followed by Dr. Buddy Duty.  She says she was on Synthroid in the past and says she was taken off of it because she no longer needed it.  I do not see this in her med hx and not sure why she would be on thyroid replacement unless her hyperactive thyroid was treated and she became hypothyroid, which does not seem to be the case.  She says she had a reaction to iodine for uptake and scan.  The uptake in scan from 2013 was normal.  She has not been to Dr. Buddy Duty in about 1 year.  She says TFTs were checked at Dr. Buddy Duty 1 year ago and said they were "borderline" and to monitor.  She is osteopenic based on DEXA in 2012. - TFTs today - TSH suppressed and normal free T4 and free T3 c/w subclinical hyperthyroidism - will refer back to Dr. Buddy Duty for further management

## 2014-09-04 NOTE — Progress Notes (Signed)
Internal Medicine Clinic Attending  Case discussed with Dr. Wallace soon after the resident saw the patient.  We reviewed the resident's history and exam and pertinent patient test results.  I agree with the assessment, diagnosis, and plan of care documented in the resident's note. 

## 2014-09-05 DIAGNOSIS — F331 Major depressive disorder, recurrent, moderate: Secondary | ICD-10-CM | POA: Diagnosis not present

## 2014-09-05 DIAGNOSIS — F419 Anxiety disorder, unspecified: Secondary | ICD-10-CM | POA: Diagnosis not present

## 2014-09-09 DIAGNOSIS — E042 Nontoxic multinodular goiter: Secondary | ICD-10-CM | POA: Diagnosis not present

## 2014-09-09 DIAGNOSIS — E0590 Thyrotoxicosis, unspecified without thyrotoxic crisis or storm: Secondary | ICD-10-CM | POA: Diagnosis not present

## 2014-09-10 ENCOUNTER — Other Ambulatory Visit (HOSPITAL_COMMUNITY): Payer: Self-pay | Admitting: Internal Medicine

## 2014-09-10 DIAGNOSIS — E0590 Thyrotoxicosis, unspecified without thyrotoxic crisis or storm: Secondary | ICD-10-CM

## 2014-09-18 ENCOUNTER — Encounter (HOSPITAL_COMMUNITY)
Admission: RE | Admit: 2014-09-18 | Discharge: 2014-09-18 | Disposition: A | Discharge status: Home / Self Care | Payer: Medicare Other | Source: Ambulatory Visit | Attending: Internal Medicine | Admitting: Internal Medicine

## 2014-09-18 DIAGNOSIS — E0590 Thyrotoxicosis, unspecified without thyrotoxic crisis or storm: Secondary | ICD-10-CM

## 2014-09-18 MED ORDER — SODIUM IODIDE I 131 CAPSULE
12.6000 | Freq: Once | INTRAVENOUS | Status: AC | PRN
  Administered 2014-09-18: 12.6 via ORAL

## 2014-09-19 ENCOUNTER — Encounter (HOSPITAL_COMMUNITY)
Admission: RE | Admit: 2014-09-19 | Discharge: 2014-09-19 | Disposition: A | Discharge status: Home / Self Care | Payer: Medicare Other | Source: Ambulatory Visit | Attending: Internal Medicine | Admitting: Internal Medicine

## 2014-09-19 DIAGNOSIS — E0590 Thyrotoxicosis, unspecified without thyrotoxic crisis or storm: Secondary | ICD-10-CM | POA: Insufficient documentation

## 2014-09-19 DIAGNOSIS — R000 Tachycardia, unspecified: Secondary | ICD-10-CM | POA: Diagnosis not present

## 2014-09-19 DIAGNOSIS — R251 Tremor, unspecified: Secondary | ICD-10-CM | POA: Diagnosis not present

## 2014-09-19 MED ORDER — SODIUM PERTECHNETATE TC 99M INJECTION
10.3700 | Freq: Once | INTRAVENOUS | Status: AC | PRN
  Administered 2014-09-19: 10 via INTRAVENOUS

## 2014-09-23 ENCOUNTER — Ambulatory Visit (HOSPITAL_COMMUNITY)
Admission: RE | Admit: 2014-09-23 | Discharge: 2014-09-23 | Disposition: A | Discharge status: Home / Self Care | Payer: Medicare Other | Source: Ambulatory Visit | Attending: Internal Medicine | Admitting: Internal Medicine

## 2014-09-23 DIAGNOSIS — E0590 Thyrotoxicosis, unspecified without thyrotoxic crisis or storm: Secondary | ICD-10-CM

## 2014-09-23 MED ORDER — SODIUM IODIDE I 131 CAPSULE
29.5000 | Freq: Once | INTRAVENOUS | Status: AC | PRN
  Administered 2014-09-23: 29.5 via ORAL

## 2014-10-22 DIAGNOSIS — E0590 Thyrotoxicosis, unspecified without thyrotoxic crisis or storm: Secondary | ICD-10-CM | POA: Diagnosis not present

## 2014-10-28 DIAGNOSIS — H11121 Conjunctival concretions, right eye: Secondary | ICD-10-CM | POA: Diagnosis not present

## 2014-10-28 DIAGNOSIS — H10413 Chronic giant papillary conjunctivitis, bilateral: Secondary | ICD-10-CM | POA: Diagnosis not present

## 2014-10-28 DIAGNOSIS — H01022 Squamous blepharitis right lower eyelid: Secondary | ICD-10-CM | POA: Diagnosis not present

## 2014-10-28 DIAGNOSIS — H04123 Dry eye syndrome of bilateral lacrimal glands: Secondary | ICD-10-CM | POA: Diagnosis not present

## 2014-10-28 DIAGNOSIS — H01025 Squamous blepharitis left lower eyelid: Secondary | ICD-10-CM | POA: Diagnosis not present

## 2014-10-28 DIAGNOSIS — H01021 Squamous blepharitis right upper eyelid: Secondary | ICD-10-CM | POA: Diagnosis not present

## 2014-10-28 DIAGNOSIS — H01024 Squamous blepharitis left upper eyelid: Secondary | ICD-10-CM | POA: Diagnosis not present

## 2014-11-06 DIAGNOSIS — Z23 Encounter for immunization: Secondary | ICD-10-CM | POA: Diagnosis not present

## 2014-11-11 DIAGNOSIS — F419 Anxiety disorder, unspecified: Secondary | ICD-10-CM | POA: Diagnosis not present

## 2014-11-11 DIAGNOSIS — F331 Major depressive disorder, recurrent, moderate: Secondary | ICD-10-CM | POA: Diagnosis not present

## 2014-11-14 DIAGNOSIS — H01021 Squamous blepharitis right upper eyelid: Secondary | ICD-10-CM | POA: Diagnosis not present

## 2014-11-14 DIAGNOSIS — H11121 Conjunctival concretions, right eye: Secondary | ICD-10-CM | POA: Diagnosis not present

## 2014-11-14 DIAGNOSIS — H2513 Age-related nuclear cataract, bilateral: Secondary | ICD-10-CM | POA: Diagnosis not present

## 2014-11-14 DIAGNOSIS — H10413 Chronic giant papillary conjunctivitis, bilateral: Secondary | ICD-10-CM | POA: Diagnosis not present

## 2014-11-14 DIAGNOSIS — H01025 Squamous blepharitis left lower eyelid: Secondary | ICD-10-CM | POA: Diagnosis not present

## 2014-11-14 DIAGNOSIS — H04123 Dry eye syndrome of bilateral lacrimal glands: Secondary | ICD-10-CM | POA: Diagnosis not present

## 2014-11-14 DIAGNOSIS — H01024 Squamous blepharitis left upper eyelid: Secondary | ICD-10-CM | POA: Diagnosis not present

## 2014-11-14 DIAGNOSIS — H01022 Squamous blepharitis right lower eyelid: Secondary | ICD-10-CM | POA: Diagnosis not present

## 2014-11-19 DIAGNOSIS — E0590 Thyrotoxicosis, unspecified without thyrotoxic crisis or storm: Secondary | ICD-10-CM | POA: Diagnosis not present

## 2014-11-21 ENCOUNTER — Ambulatory Visit (INDEPENDENT_AMBULATORY_CARE_PROVIDER_SITE_OTHER): Payer: Medicare Other | Admitting: Internal Medicine

## 2014-11-21 ENCOUNTER — Encounter: Payer: Self-pay | Admitting: Internal Medicine

## 2014-11-21 ENCOUNTER — Telehealth: Payer: Self-pay | Admitting: *Deleted

## 2014-11-21 VITALS — BP 143/77 | HR 93 | Temp 98.8°F | Ht 64.0 in | Wt 152.8 lb

## 2014-11-21 DIAGNOSIS — R000 Tachycardia, unspecified: Secondary | ICD-10-CM | POA: Diagnosis not present

## 2014-11-21 DIAGNOSIS — I10 Essential (primary) hypertension: Secondary | ICD-10-CM | POA: Diagnosis not present

## 2014-11-21 MED ORDER — CYCLOBENZAPRINE HCL 10 MG PO TABS: 10.0000 mg | ORAL_TABLET | Freq: Three times a day (TID) | ORAL | Status: DC | PRN

## 2014-11-21 NOTE — Telephone Encounter (Signed)
Pt called - since 03/2014 having SOB and rapid pulse. Has been seeing Dr Buddy Duty regarding her thyroid and doing test. Appt made 11/21/14 1:45PM Dr Marlowe Sax. Hilda Blades Cope Marte RN 11/21/14 10:50AM

## 2014-11-21 NOTE — Patient Instructions (Addendum)
Doris [name] it was nice meeting you today.  -I have referred you to Cardiology today. Our office will call you with an appointment date.  -Your Flexeril prescription has been refilled.   -Please return for a follow up visit in 3-4 weeks.   -If your symptoms become worse or you experience chest pain, please seek medical attention immediately!

## 2014-11-23 NOTE — Progress Notes (Signed)
Patient ID: Doris [name], female   DOB: Dec 29, 1945, 69 y.o.   MRN: [id number]   Subjective:   Patient ID: Doris [name] female   DOB: May 31, 1945 69 y.o.   MRN: [id number]  HPI: [name] is a 69 y.o. F with a PMHx as listed below presenting to the clinic today for a follow-up of her tachycardia. Please refer to the A&P section for further details about the patient's chronic medical conditions.     Past Medical History  Diagnosis Date  . Hypertension   . Insomnia   . Vertigo   . Migraine   . Thyroid nodule 2010    TSH 04/2008 = 0.296, no follow up noted in [facility]  . HLD (hyperlipidemia)   . Psychosis     followed by Dr. Keenan Bachelor [phone]  . Acid reflux   . Back pain   . Osteoarthritis     knee   Current Outpatient Prescriptions  Medication Sig Dispense Refill  . ALPRAZolam (XANAX) 0.25 MG tablet Take 0.25 mg by mouth at bedtime as needed for anxiety.    Marland Kitchen amLODipine (NORVASC) 5 MG tablet Take 1 tablet (5 mg total) by mouth daily. 90 tablet 3  . Biotin 1000 MCG tablet Take 3,000 mcg by mouth daily.    . calcium carbonate (OS-CAL - DOSED IN MG OF ELEMENTAL CALCIUM) 1250 MG tablet Take 1 tablet by mouth daily.    . cyanocobalamin 100 MCG tablet Take 100 mcg by mouth daily.    . cyclobenzaprine (FLEXERIL) 10 MG tablet Take 1 tablet (10 mg total) by mouth 3 (three) times daily as needed for muscle spasms. 30 tablet 0  . DULoxetine (CYMBALTA) 20 MG capsule Take 60 mg by mouth daily.     . quinapril (ACCUPRIL) 10 MG tablet Take 1 tablet (10 mg total) by mouth daily. 90 tablet 3  . ranitidine (ZANTAC) 150 MG tablet Take 1 tablet (150 mg total) by mouth 2 (two) times daily. 90 tablet 3  . simvastatin (ZOCOR) 20 MG tablet TAKE 1 TABLET (20 MG TOTAL) BY MOUTH AT BEDTIME. 90 tablet 3   No current facility-administered medications for this visit.   Family History  Problem Relation Age of Onset  . Stroke Neg Hx   . Heart disease Other   . Cancer Neg Hx   . Colon cancer  Neg Hx    Social History   Social History  . Marital Status: Divorced    Spouse Name: N/A  . Number of Children: N/A  . Years of Education: N/A   Occupational History  . retired    Social History Main Topics  . Smoking status: Never Smoker   . Smokeless tobacco: Never Used  . Alcohol Use: No  . Drug Use: No  . Sexual Activity: No   Other Topics Concern  . None   Social History Narrative   Review of Systems: Review of Systems  Constitutional: Negative for fever and weight loss.  HENT: Negative for ear pain.   Eyes: Negative for blurred vision and pain.  Respiratory: Positive for shortness of breath. Negative for cough and wheezing.   Cardiovascular: Positive for palpitations. Negative for chest pain and leg swelling.  Gastrointestinal: Negative for nausea, vomiting, abdominal pain, diarrhea and constipation.  Musculoskeletal: Negative for myalgias.  Skin: Negative for itching and rash.  Neurological: Negative for dizziness, sensory change, focal weakness and headaches.    Objective:  Physical Exam: Filed Vitals:   11/21/14 1417  BP: 143/77  Pulse: 93  Temp: 98.8 F (37.1 C)  TempSrc: Oral  Height: 5\' 4"  (1.626 m)  Weight: 152 lb 12.8 oz (69.31 kg)  SpO2: 98%   Physical Exam  Constitutional: She is oriented to person, place, and time. She appears well-developed and well-nourished. No distress.  HENT:  Head: Normocephalic and atraumatic.  Eyes: EOM are normal. Pupils are equal, round, and reactive to light.  Neck: Neck supple. No tracheal deviation present.  Cardiovascular: Normal rate, regular rhythm and intact distal pulses.   Pulmonary/Chest: Effort normal. No respiratory distress. She has no wheezes. She has no rales.  Abdominal: Soft. Bowel sounds are normal. She exhibits no distension. There is no tenderness.  Musculoskeletal: She exhibits no edema.  Neurological: She is alert and oriented to person, place, and time.  Skin: Skin is warm and dry.    Assessment & Plan:

## 2014-11-23 NOTE — Assessment & Plan Note (Addendum)
Patient reports having palpitations and SOB for years. States her symptoms are present even at rest. Denies any CP, dizziness, or LOC. Denies any cough or wheezing. Patient states she does not drink caffeinated beverages. Symptoms not likely related to hyperthyroidism because 09/2014 she underwent radioactive iodine therapy for hyperthyroidism. Free T4 normal (0.94) on 10/28/14. However, she still continues to have symptoms. States she had a stress test years ago and was given nitroglycerin for CP at that time.  HR 93 and BP 143/77 at this visit. -Patient declined EKG today. -Cardiology referral for event monitor  -Patient advised to seek medical attention immediately if her symptoms worsened or she developed chest pain or dizziness.

## 2014-11-23 NOTE — Assessment & Plan Note (Signed)
BP Readings from Last 3 Encounters:  11/21/14 143/77  08/26/14 122/74  06/27/14 138/80    Lab Results  Component Value Date   NA 140 12/20/2012   K 3.4* 12/20/2012   CREATININE 0.67 12/20/2012    Assessment: Blood pressure control:  controlled  Progress toward BP goal:   under goal (<150/90)  Plan: Medications: Continue current management with Amlodipine 5 mg daily and Quinapril 10 mg daily.

## 2014-11-25 NOTE — Progress Notes (Signed)
Internal Medicine Clinic Attending  I saw and evaluated the patient.  I personally confirmed the key portions of the history and exam documented by Dr. Marlowe Sax and I reviewed pertinent patient test results.  The assessment, diagnosis, and plan were formulated together and I agree with the documentation in the resident's note.  Tachycardia previously thought to be related to symptomatic hyperthyroidism, no s/p radio-iodine ablation but symptoms of intermittent palpitations persist daily. Has not been captured by office ECG yet. In order to better evaluate for potential AVNRT or AVRT we will need an ambulatory event monitor for which we referred her to cardiology.

## 2014-11-28 DIAGNOSIS — Z1231 Encounter for screening mammogram for malignant neoplasm of breast: Secondary | ICD-10-CM | POA: Diagnosis not present

## 2014-12-17 DIAGNOSIS — E0590 Thyrotoxicosis, unspecified without thyrotoxic crisis or storm: Secondary | ICD-10-CM | POA: Diagnosis not present

## 2014-12-23 ENCOUNTER — Ambulatory Visit (INDEPENDENT_AMBULATORY_CARE_PROVIDER_SITE_OTHER): Payer: Medicare Other | Admitting: Cardiology

## 2014-12-23 ENCOUNTER — Encounter: Payer: Self-pay | Admitting: Cardiology

## 2014-12-23 VITALS — BP 140/80 | HR 105 | Ht 64.0 in | Wt 155.0 lb

## 2014-12-23 DIAGNOSIS — R000 Tachycardia, unspecified: Secondary | ICD-10-CM | POA: Diagnosis not present

## 2014-12-23 DIAGNOSIS — R0609 Other forms of dyspnea: Secondary | ICD-10-CM

## 2014-12-23 DIAGNOSIS — I2510 Atherosclerotic heart disease of native coronary artery without angina pectoris: Secondary | ICD-10-CM

## 2014-12-23 DIAGNOSIS — I2584 Coronary atherosclerosis due to calcified coronary lesion: Secondary | ICD-10-CM

## 2014-12-23 DIAGNOSIS — R0600 Dyspnea, unspecified: Secondary | ICD-10-CM | POA: Insufficient documentation

## 2014-12-23 NOTE — Progress Notes (Signed)
Cardiology Office Note   Date:  12/23/2014   ID:  Doris [name], DOB 1945/05/31, MRN [id number]  PCP:  Loleta Chance, MD  Cardiologist:   Candee Furbish, MD       History of Present Illness: Doris [name] is a 69 y.o. female who presents for evaluation of tachycardia. Prior office note from 11/21/14 reviewed. She had been reporting palpitations and shortness of breath for years. No chest pain or loss of consciousness. No excessive caffeine. Her free T4 is currently normal however she continues to have tachycardia. Stress test years ago was performed.  She is also been noticing shortness of breath with exertional activity. She has stopped exercising because of this. She is noticed this over the last few months. Wonder if this. Correlation with hyperthyroidism. No chest pain. She did have a CT scan of chest which showed coronary calcification of LAD and RCA.  Her tachycardia has been present as well as shortness of breath since February 2016.  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  Past Medical History  Diagnosis Date  . Hypertension   . Insomnia   . Vertigo   . Migraine   . Thyroid nodule 2010    TSH 04/2008 = 0.296, no follow up noted in [facility]  . HLD (hyperlipidemia)   . Psychosis     followed by Dr. Keenan Bachelor [phone]  . Acid reflux   . Back pain   . Osteoarthritis     knee    Past Surgical History  Procedure Laterality Date  . Uterine fibroid surgery    . Tonsillectomy       Current Outpatient Prescriptions  Medication Sig Dispense Refill  . ALPRAZolam (XANAX) 0.25 MG tablet Take 0.25 mg by mouth at bedtime as needed for anxiety.    Marland Kitchen amLODipine (NORVASC) 5 MG tablet Take 1 tablet (5 mg total) by mouth daily. 90 tablet 3  . Biotin 1000 MCG tablet Take 3,000 mcg by mouth daily.    . cyanocobalamin 100 MCG tablet Take 100 mcg by mouth daily.    . cyclobenzaprine (FLEXERIL) 10 MG tablet Take 1 tablet (10 mg total) by mouth 3 (three) times daily as needed for  muscle spasms. 30 tablet 0  . doxepin (SINEQUAN) 25 MG capsule     . DULoxetine (CYMBALTA) 60 MG capsule TAKE ONE CAPSULE BY MOUTH EVERY EVENING FOR ANXIETY/DEPRESSION  3  . PAZEO 0.7 % SOLN PLACE 1 DROP IN BOTH EYES DAILY  3  . quinapril (ACCUPRIL) 10 MG tablet Take 1 tablet (10 mg total) by mouth daily. 90 tablet 3  . ranitidine (ZANTAC) 150 MG tablet Take 1 tablet (150 mg total) by mouth 2 (two) times daily. 90 tablet 3  . simvastatin (ZOCOR) 20 MG tablet TAKE 1 TABLET (20 MG TOTAL) BY MOUTH AT BEDTIME. 90 tablet 3   No current facility-administered medications for this visit.    Allergies:   Lidocaine and Olanzapine    Social History:  The patient  reports that she has never smoked. She has never used smokeless tobacco. She reports that she does not drink alcohol or use illicit drugs.   Family History:  The patient's family history includes Heart disease in her other. There is no history of Stroke, Cancer, or Colon cancer. her mother died of congestive heart failure at a later age.   ROS:  Please see the history of present illness.   Otherwise, review of systems are positive for sweating, shortness of breath with activity, shortness of  breath when laying down, joint swelling, dizziness, constipation.   All other systems are reviewed and negative.    PHYSICAL EXAM: VS:  BP 140/80 mmHg  Pulse 105  Ht 5\' 4"  (1.626 m)  Wt 155 lb (70.308 kg)  BMI 26.59 kg/m2  SpO2 95%  LMP 03/27/1984 , BMI Body mass index is 26.59 kg/(m^2). GEN: Well nourished, well developed, in no acute distress HEENT: normal Neck: no JVD, carotid bruits, or masses Cardiac: Regular but mildly tachycardic; no murmurs, rubs, or gallops,no edema  Respiratory:  clear to auscultation bilaterally, normal work of breathing GI: soft, nontender, nondistended, + BS MS: no deformity or atrophy Skin: warm and dry, no rash Neuro:  Strength and sensation are intact Psych: euthymic mood, full affect   EKG:  No EKG  performed today however previous reviewed and normal sinus rhythm, heart rate 87.  Recent Labs: 08/26/2014: Hemoglobin 15.0; Platelets 183; TSH 0.165*    Lipid Panel    Component Value Date/Time   CHOL 196 05/17/2014 1527   TRIG 168* 05/17/2014 1527   HDL 68 05/17/2014 1527   CHOLHDL 2.9 05/17/2014 1527   VLDL 34 05/17/2014 1527   LDLCALC 94 05/17/2014 1527      Wt Readings from Last 3 Encounters:  12/23/14 155 lb (70.308 kg)  11/21/14 152 lb 12.8 oz (69.31 kg)  08/26/14 148 lb 11.2 oz (67.45 kg)      Other studies Reviewed: Additional studies/ records that were reviewed today include: Prior office notes, lab work, CT scan. Review of the above records demonstrates: none   ASSESSMENT AND PLAN:  Dyspnea on exertion  - I will go ahead and check an echocardiogram to ensure proper structure and function of her heart. Tachycardia may be present with hyperthyroidism although she has had radioactive iodine ablation quite some time ago and her free T4 is now normal. Since it is only mildly elevated, I do not feel strongly that we need to beta blocker. Echocardiogram will be helpful to make sure that she has not developed a cardiomyopathy surrounding her hyperthyroidism. Given her coronary artery calcification noted in the LAD and RCA on CT scan, personally viewed, will go ahead and pursue nuclear stress test as well. Her dyspnea on exertion may be anginal equivalent.  Hyperthyroidism status post radioactive iodine ablation  - Dr. Buddy Duty  - Recent free T4 normal  Coronary artery calcification  - LAD, RCA  - Checking stress test  - Hyperthyroidism can be an accelerated for atherosclerosis.  Tachycardia  - As noted above. We will continue to monitor clinically.  - If necessary, we can add beta blocker  - This low level of tachycardia should not cause tachycardia mediated cardiomyopathy.  Current medicines are reviewed at length with the patient today.  The patient does not have  concerns regarding medicines.  The following changes have been made:  no change  Labs/ tests ordered today include:   Orders Placed This Encounter  Procedures  . Myocardial Perfusion Imaging  . Echocardiogram     Disposition:   FU with Loran Fleet in 2 months  Signed, Candee Furbish, MD  12/23/2014 12:19 PM    [facility] [place], [place], [place]  [address] Phone: [phone]; Fax: [phone]

## 2014-12-23 NOTE — Patient Instructions (Signed)
Medication Instructions:  The current medical regimen is effective;  continue present plan and medications.  Testing/Procedures: Your physician has requested that you have an echocardiogram. Echocardiography is a painless test that uses sound waves to create images of your heart. It provides your doctor with information about the size and shape of your heart and how well your heart's chambers and valves are working. This procedure takes approximately one hour. There are no restrictions for this procedure.  Your physician has requested that you have a myoview. For further information please visit [email or web address]. Please follow instruction sheet, as given.  Follow-Up: Follow up after testing.  If you need a refill on your cardiac medications before your next appointment, please call your pharmacy.

## 2014-12-25 DIAGNOSIS — E042 Nontoxic multinodular goiter: Secondary | ICD-10-CM | POA: Diagnosis not present

## 2014-12-30 ENCOUNTER — Telehealth (HOSPITAL_COMMUNITY): Payer: Self-pay | Admitting: *Deleted

## 2014-12-30 ENCOUNTER — Ambulatory Visit (INDEPENDENT_AMBULATORY_CARE_PROVIDER_SITE_OTHER): Payer: Medicare Other | Admitting: Internal Medicine

## 2014-12-30 ENCOUNTER — Encounter: Payer: Self-pay | Admitting: Internal Medicine

## 2014-12-30 VITALS — BP 158/82 | HR 90 | Temp 98.7°F | Ht 65.0 in | Wt 156.5 lb

## 2014-12-30 DIAGNOSIS — I2510 Atherosclerotic heart disease of native coronary artery without angina pectoris: Secondary | ICD-10-CM | POA: Diagnosis not present

## 2014-12-30 DIAGNOSIS — I2584 Coronary atherosclerosis due to calcified coronary lesion: Secondary | ICD-10-CM | POA: Diagnosis not present

## 2014-12-30 DIAGNOSIS — I10 Essential (primary) hypertension: Secondary | ICD-10-CM | POA: Diagnosis not present

## 2014-12-30 DIAGNOSIS — R0609 Other forms of dyspnea: Secondary | ICD-10-CM

## 2014-12-30 DIAGNOSIS — R6884 Jaw pain: Secondary | ICD-10-CM

## 2014-12-30 DIAGNOSIS — R1031 Right lower quadrant pain: Secondary | ICD-10-CM | POA: Diagnosis not present

## 2014-12-30 LAB — BASIC METABOLIC PANEL
Anion gap: 5 (ref 5–15)
BUN: <5 mg/dL — ABNORMAL LOW (ref 6–20)
CHLORIDE: 106 mmol/L (ref 101–111)
CO2: 31 mmol/L (ref 22–32)
Calcium: 9.8 mg/dL (ref 8.9–10.3)
Creatinine, Ser: 0.84 mg/dL (ref 0.44–1.00)
GFR calc Af Amer: >60 mL/min (ref >60)
Glucose, Bld: 88 mg/dL (ref 65–99)
POTASSIUM: 3.8 mmol/L (ref 3.5–5.1)
SODIUM: 142 mmol/L (ref 135–145)

## 2014-12-30 MED ORDER — CYCLOBENZAPRINE HCL 10 MG PO TABS: 10.0000 mg | ORAL_TABLET | Freq: Three times a day (TID) | ORAL | Status: DC | PRN

## 2014-12-30 MED ORDER — ACETAMINOPHEN-CODEINE #3 300-30 MG PO TABS: 1.0000 | ORAL_TABLET | Freq: Three times a day (TID) | ORAL | Status: DC | PRN

## 2014-12-30 NOTE — Progress Notes (Signed)
   Patient ID: Doris [name] female   DOB: [birth year] 69 y.o.   MRN: [id number]  Subjective:   HPI: [name] is a 69 y.o. with PMH including HTN, chronic tachycardia, hypothyroidism 2/2 RAI ablation who presents to [facility] today for evaluation of jaw pain and right lower abdominal pain as well as follow-up of her tachycardia and DOE.   She has noticed intermittent L jaw pain for the past 2-3 years. Previously the dull pain went away on its own but it recurred starting this past July. She says it is generally constant and dull over this time, located over her L masseter, worsened by chewing. She does have an extensive history of cavities on that side of her mouth, and did have teeth pulled in the contralateral side. She says she has seen dentists in the past who said it was 'anxiety' or 'TMJ pain', and other attempts to see a dentist fell through because they didn't accept her insurance. She says tylenol #3 does help the pain intermittently. She does have occasional sinus congestion and also says her breath smells worse than usual. Otherwise, she denies fever, trauma, drainage, history of ear infections, swollen lymph nodes, or any other associated symptoms.  She also notes that she has had dull, non-radiating RLQ abdominal pain that comes and goes over the past few weeks and is associated with nausea. She says she has been constipated over the past couple of months, with 1 BM q 3-4 days. She has hypothyroidism managed by her endocrinologist. She takes OTC stool softeners and laxatives and says she drinks 6 cups of water a day at least. She has tried fiber supplements in the past but says they are typically too expensive. She denies any urinary symptoms, melena, hematochezia, vomiting, abdominal swelling, or any other symptoms.   Please see problem-based charting for status of medical issues pertinent to this visit.  Review of Systems: Pertinent items noted in HPI and remainder of comprehensive ROS  otherwise negative.  Objective:  Physical Exam: Filed Vitals:   12/30/14 1325  BP: 158/82  Pulse: 90  Temp: 98.7 F (37.1 C)  TempSrc: Oral  Height: 5\' 5"  (1.651 m)  Weight: 156 lb 8 oz (70.988 kg)  SpO2: 97%   Gen: Well-appearing, alert and oriented to person, place, and time HEENT: Oropharynx clear without erythema or exudate. Poor dentition throughout with multiple fillings. TM's clear bilaterally, nares non-inflamed, no sinus pressure to palpation over the maxillary or frontal sinuses. Neck: No cervical LAD, no thyromegaly or nodules, no JVD noted. CV: Normal rate, regular rhythm, no murmurs, rubs, or gallops Pulmonary: Normal effort, CTA bilaterally, no wheezing, rales, or rhonchi Abdominal: Soft, non-tender, non-distended, without rebound, guarding, or masses. Normal active bowel sounds. Extremities: Distal pulses 2+ in upper and lower extremities bilaterally, no tenderness, erythema or edema Skin: No atypical appearing moles. No rashes  Assessment & Plan:  Please see problem-based charting for assessment and plan.  Blane Ohara, MD Resident Physician, PGY-1 Department of Internal Medicine [facility]

## 2014-12-30 NOTE — Telephone Encounter (Signed)
Patient given detailed instructions per Myocardial Perfusion Study Information Sheet for the test on 01/01/15 at 715. Patient notified to arrive 15 minutes early and that it is imperative to arrive on time for appointment to keep from having the test rescheduled.  If you need to cancel or reschedule your appointment, please call the office within 24 hours of your appointment. Failure to do so may result in a cancellation of your appointment, and a $50 no show fee. Patient verbalized understanding.Hubbard Robinson, RN

## 2014-12-30 NOTE — Assessment & Plan Note (Signed)
Most likely dental-related pain vs TMJ pain. TMs are clear so referred otalgia is less likely.  -Consider mouthguard for possible teeth grinding versus other TMJ-causes -Referral to dental for further evaluation -Refilled tylenol #3

## 2014-12-30 NOTE — Assessment & Plan Note (Signed)
Patient is currently being followed by cardiology and is scheduled for myoview in 2 days. Does not endorse worsening of baseline chronic symptoms at this time. -Will follow cardiology recs s/p myoview

## 2014-12-30 NOTE — Assessment & Plan Note (Signed)
Most likely 2/2 constipation in the setting of acquired hypothyroidism, given change in bowel function, vague intermittent nature associated with nausea and not associated with foods. Abdominal exam is essentially benign. -Counseled on adequate hydration, stool softeners, fiber intake -Continue hypothyroid management per endocrine; consider post-ablation TFTs sooner rather than later although no other s/s of hypothyroidism currently

## 2014-12-31 NOTE — Progress Notes (Signed)
Internal Medicine Clinic Attending  I saw and evaluated the patient.  I personally confirmed the key portions of the history and exam documented by Dr. Kennedy and I reviewed pertinent patient test results.  The assessment, diagnosis, and plan were formulated together and I agree with the documentation in the resident's note.  

## 2015-01-01 ENCOUNTER — Ambulatory Visit (HOSPITAL_COMMUNITY): Payer: Medicare Other | Attending: Cardiology

## 2015-01-01 ENCOUNTER — Other Ambulatory Visit: Payer: Self-pay

## 2015-01-01 ENCOUNTER — Ambulatory Visit (HOSPITAL_BASED_OUTPATIENT_CLINIC_OR_DEPARTMENT_OTHER): Payer: Medicare Other

## 2015-01-01 DIAGNOSIS — R0609 Other forms of dyspnea: Secondary | ICD-10-CM | POA: Insufficient documentation

## 2015-01-01 DIAGNOSIS — I2510 Atherosclerotic heart disease of native coronary artery without angina pectoris: Secondary | ICD-10-CM | POA: Diagnosis not present

## 2015-01-01 DIAGNOSIS — R000 Tachycardia, unspecified: Secondary | ICD-10-CM

## 2015-01-01 DIAGNOSIS — I2584 Coronary atherosclerosis due to calcified coronary lesion: Secondary | ICD-10-CM

## 2015-01-01 LAB — MYOCARDIAL PERFUSION IMAGING
CHL CUP NUCLEAR SDS: 2
CHL CUP RESTING HR STRESS: 76 {beats}/min
CSEPEDS: 0 s
CSEPEW: 7.1 METS
Exercise duration (min): 7 min
LVDIAVOL: 62 mL
LVSYSVOL: 23 mL
MPHR: 150 {beats}/min
Peak HR: 131 {beats}/min
Percent HR: 87 %
RATE: 0.28
SRS: 1
SSS: 3
TID: 0.97

## 2015-01-01 MED ORDER — TECHNETIUM TC 99M SESTAMIBI GENERIC - CARDIOLITE
10.8000 | Freq: Once | INTRAVENOUS | Status: AC | PRN
  Administered 2015-01-01: 11 via INTRAVENOUS

## 2015-01-01 MED ORDER — TECHNETIUM TC 99M SESTAMIBI GENERIC - CARDIOLITE
31.6000 | Freq: Once | INTRAVENOUS | Status: AC | PRN
  Administered 2015-01-01: 32 via INTRAVENOUS

## 2015-01-03 DIAGNOSIS — E042 Nontoxic multinodular goiter: Secondary | ICD-10-CM | POA: Diagnosis not present

## 2015-01-31 DIAGNOSIS — E042 Nontoxic multinodular goiter: Secondary | ICD-10-CM | POA: Diagnosis not present

## 2015-02-21 DIAGNOSIS — M1711 Unilateral primary osteoarthritis, right knee: Secondary | ICD-10-CM | POA: Diagnosis not present

## 2015-02-21 DIAGNOSIS — M1712 Unilateral primary osteoarthritis, left knee: Secondary | ICD-10-CM | POA: Diagnosis not present

## 2015-02-21 DIAGNOSIS — M170 Bilateral primary osteoarthritis of knee: Secondary | ICD-10-CM | POA: Diagnosis not present

## 2015-02-24 ENCOUNTER — Ambulatory Visit: Payer: Medicare Other | Admitting: Cardiology

## 2015-04-01 DIAGNOSIS — F331 Major depressive disorder, recurrent, moderate: Secondary | ICD-10-CM | POA: Diagnosis not present

## 2015-04-01 DIAGNOSIS — F411 Generalized anxiety disorder: Secondary | ICD-10-CM | POA: Diagnosis not present

## 2015-04-19 ENCOUNTER — Other Ambulatory Visit: Payer: Self-pay | Admitting: Internal Medicine

## 2015-04-24 DIAGNOSIS — E042 Nontoxic multinodular goiter: Secondary | ICD-10-CM | POA: Diagnosis not present

## 2015-04-24 DIAGNOSIS — E890 Postprocedural hypothyroidism: Secondary | ICD-10-CM | POA: Diagnosis not present

## 2015-04-25 ENCOUNTER — Other Ambulatory Visit: Payer: Self-pay | Admitting: Internal Medicine

## 2015-05-19 ENCOUNTER — Other Ambulatory Visit: Payer: Self-pay | Admitting: Internal Medicine

## 2015-05-19 NOTE — Telephone Encounter (Signed)
Tried to call patient to clarify- her amlodipine was sent [facility] today- have requested that she call back if she does need [facility]

## 2015-05-19 NOTE — Telephone Encounter (Signed)
Pt requesting [facility] [address].

## 2015-05-20 ENCOUNTER — Telehealth: Payer: Self-pay | Admitting: Internal Medicine

## 2015-05-20 DIAGNOSIS — M1712 Unilateral primary osteoarthritis, left knee: Secondary | ICD-10-CM | POA: Diagnosis not present

## 2015-05-20 NOTE — Telephone Encounter (Signed)
[facility]

## 2015-05-20 NOTE — Telephone Encounter (Signed)
Please call pt back.

## 2015-06-02 DIAGNOSIS — M1712 Unilateral primary osteoarthritis, left knee: Secondary | ICD-10-CM | POA: Diagnosis not present

## 2015-06-06 DIAGNOSIS — E890 Postprocedural hypothyroidism: Secondary | ICD-10-CM | POA: Diagnosis not present

## 2015-06-09 DIAGNOSIS — M1712 Unilateral primary osteoarthritis, left knee: Secondary | ICD-10-CM | POA: Diagnosis not present

## 2015-06-12 DIAGNOSIS — F411 Generalized anxiety disorder: Secondary | ICD-10-CM | POA: Diagnosis not present

## 2015-06-12 DIAGNOSIS — F331 Major depressive disorder, recurrent, moderate: Secondary | ICD-10-CM | POA: Diagnosis not present

## 2015-06-14 ENCOUNTER — Other Ambulatory Visit: Payer: Self-pay | Admitting: Internal Medicine

## 2015-06-16 NOTE — Telephone Encounter (Signed)
Last appointment 12/30/2014. Advised to make follow up for 2 months at that appointment. Will send to front office to make appointment.

## 2015-06-18 ENCOUNTER — Encounter: Payer: Self-pay | Admitting: Internal Medicine

## 2015-06-18 NOTE — Telephone Encounter (Signed)
Attempted to call patient this am in reference to scheduling an appt with her pcp, but no answer.  Left message asking her to please give me a call back.  Will also send a letter to pt requesting the same.

## 2015-06-27 DIAGNOSIS — M1712 Unilateral primary osteoarthritis, left knee: Secondary | ICD-10-CM | POA: Diagnosis not present

## 2015-07-04 ENCOUNTER — Telehealth: Payer: Self-pay | Admitting: Internal Medicine

## 2015-07-04 DIAGNOSIS — M1712 Unilateral primary osteoarthritis, left knee: Secondary | ICD-10-CM | POA: Diagnosis not present

## 2015-07-04 NOTE — Telephone Encounter (Signed)
APT. REMINDER CALL, LMTCB °

## 2015-07-07 ENCOUNTER — Ambulatory Visit (INDEPENDENT_AMBULATORY_CARE_PROVIDER_SITE_OTHER): Payer: Medicare Other | Admitting: Internal Medicine

## 2015-07-07 ENCOUNTER — Encounter: Payer: Self-pay | Admitting: Internal Medicine

## 2015-07-07 ENCOUNTER — Ambulatory Visit (HOSPITAL_COMMUNITY)
Admission: RE | Admit: 2015-07-07 | Discharge: 2015-07-07 | Disposition: A | Discharge status: Home / Self Care | Payer: Medicare Other | Source: Ambulatory Visit | Attending: Internal Medicine | Admitting: Internal Medicine

## 2015-07-07 VITALS — BP 145/85 | HR 98 | Temp 98.6°F | Ht 65.0 in | Wt 159.5 lb

## 2015-07-07 DIAGNOSIS — I10 Essential (primary) hypertension: Secondary | ICD-10-CM

## 2015-07-07 DIAGNOSIS — L918 Other hypertrophic disorders of the skin: Secondary | ICD-10-CM

## 2015-07-07 DIAGNOSIS — R000 Tachycardia, unspecified: Secondary | ICD-10-CM | POA: Insufficient documentation

## 2015-07-07 NOTE — Assessment & Plan Note (Signed)
She is at-goal of <150/90 so we will continue amlodipine 5mg  daily and quanapril 10mg  daily.

## 2015-07-07 NOTE — Progress Notes (Signed)
Patient ID: Doris [name], female   DOB: 01/10/1946, 70 y.o.   MRN: [id number]   [facility] Subjective:   Patient ID: Doris [name] female   DOB: Jun 14, 1945 70 y.o.   MRN: [id number]  HPI: [name] is a 71 y.o. female with hypertension, multinodular goiter status-post radioiodine ablation, osteoarthritis, and major depressive disorder here for follow-up of hypertension and evaluation of tachycardia and skin tags.  Hypertension: She has been taking amlodipine 5mg  daily and quianprine 10mg  daily without any presyncope or side effects.  Tachycardia: She continues to be tachycardic today at 110. She denies feeling any palpitations but sometimes the tachycardia makes her anxious that her heart is working too hard. She last saw her endocrinologist, Dr. Buddy Duty, last month and reportedly she had a normal TSH on synthroid 38mcg daily.  Skin tags: She has about 20 skin tags around the left side of her neck that become inflamed when she wears a necklace and tight-fitting shirts.  She is not smoking and I have reviewed her medications with her today.  Review of Systems  Constitutional: Negative for fever, weight loss and malaise/fatigue.  Eyes: Negative for blurred vision.  Respiratory: Negative for cough and shortness of breath.   Cardiovascular: Negative for chest pain, palpitations, orthopnea and leg swelling.  Musculoskeletal: Positive for back pain and joint pain. Negative for myalgias and falls.  Skin: Negative for rash.  Neurological: Negative for dizziness, focal weakness, loss of consciousness and headaches.  Psychiatric/Behavioral: Negative for depression and substance abuse. The patient is nervous/anxious.     Objective:  Physical Exam: Filed Vitals:   07/07/15 1511  BP: 145/85  Pulse: 98  Temp: 98.6 F (37 C)  TempSrc: Oral  Height: 5\' 5"  (1.651 m)  Weight: 159 lb 8 oz (72.349 kg)  SpO2: 98%   General: anxious lady resting in chair comfortably,  appropriately conversational HEENT: no scleral icterus, extra-ocular muscles intact, oropharynx without lesions Cardiac: tachycardic to 90s with regular rhythm, no rubs, murmurs or gallops Pulm: breathing well, clear to auscultation bilaterally Abd: bowel sounds normal, soft, nondistended, non-tender Ext: warm and well perfused, without pedal edema Lymph: no cervical or supraclavicular lymphadenopathy Skin: no rash, hair, or nail changes Neuro: alert and oriented X3, cranial nerves II-XII grossly intact, moving all extremities well  Skin tag cryotherapy: After obtaining informed consent, six acrochordons were frozen for 10 seconds using a CryoFreeze. She tolerated the procedure well without complications. Attending Dr. Evette Doffing was present for the entirety of the procedure.  Assessment & Plan:  Case discussed with Dr. Evette Doffing  Essential hypertension She is at-goal of <150/90 so we will continue amlodipine 5mg  daily and quanapril 10mg  daily.  Tachycardia She continues to by tachycardic today to 94. Her EKG showed sinus tachycardia. She had a multinodular goiter and underwent radio-iodine ablation; tells me Dr. Buddy Duty follows her TSH bimonthly and all of her levels have been normal, so I doubt this is thyroid-related. I've reassured her that her tachycardia is safe and unlikely to cause tachycardia-induced cardiomyopathy unless her pulse is consistently over 110. She agreed to hold off on starting a new medication but would schedule an appointment if her rate increases to the 100s to be re-evaluated to consider starting a beta blocker.  Acrochordon We froze 6 of the largest pedunculated acrochordons off of her neck today. She tolerated the procedure well and we told her we can freeze more as they come up.   Other Orders Orders Placed This Encounter  Procedures  .  EKG 12-Lead   Follow Up: Return in about 3 months (around 10/07/2015) for blood pressure check.

## 2015-07-07 NOTE — Assessment & Plan Note (Signed)
She continues to by tachycardic today to 94. Her EKG showed sinus tachycardia. She had a multinodular goiter and underwent radio-iodine ablation; tells me Dr. Buddy Duty follows her TSH bimonthly and all of her levels have been normal, so I doubt this is thyroid-related. I've reassured her that her tachycardia is safe and unlikely to cause tachycardia-induced cardiomyopathy unless her pulse is consistently over 110. She agreed to hold off on starting a new medication but would schedule an appointment if her rate increases to the 100s to be re-evaluated to consider starting a beta blocker.

## 2015-07-07 NOTE — Assessment & Plan Note (Signed)
We froze 6 of the largest pedunculated acrochordons off of her neck today. She tolerated the procedure well and we told her we can freeze more as they come up.

## 2015-07-09 DIAGNOSIS — E890 Postprocedural hypothyroidism: Secondary | ICD-10-CM | POA: Diagnosis not present

## 2015-07-09 NOTE — Progress Notes (Signed)
Internal Medicine Clinic Attending  I saw and evaluated the patient.  I personally confirmed the key portions of the history and exam documented by Dr. Melburn Hake and I reviewed pertinent patient test results.  The assessment, diagnosis, and plan were formulated together and I agree with the documentation in the resident's note. I was present for the entirety of the cryoablation therapy to the skin tags, the patient tolerated the treatments well.

## 2015-07-11 DIAGNOSIS — M1712 Unilateral primary osteoarthritis, left knee: Secondary | ICD-10-CM | POA: Diagnosis not present

## 2015-08-05 ENCOUNTER — Encounter: Payer: Self-pay | Admitting: *Deleted

## 2015-08-12 NOTE — Progress Notes (Signed)
Cardiology Office Note    Date:  08/14/2015   ID:  Doris [name], DOB October 30, 1945, MRN [id number]  PCP:  Doris Chance, MD  Cardiologist: Dr. Marlou Porch  CC: Follow up [place]  History of Present Illness:  Doris [name] is a 70 y.o. female with a history of HTN,  multinodular goiter with hyperthyroidism s/p radioiodine ablation, calcification on CTA, and tachycardia who presents to clinic for follow up.   She was evaluated by Dr. Marlou Porch in 12/2014 for DOE and tachycardia. A 2D ECHO was ordered which showed normal LV ED, aortic valve sclerosis and mild MR. A myoview was also ordered which returned low risk. She was noted to have sinus tach with HR 105. However, it was not felt to be due to her thyroid due to recent lab work with normal TSH and free T4.   Today she presents to clinic for follow up. She feels like her HR goes up a lot and it also causes her to be SOB. She feels like she can feel her her racing as well. She denies having chest pain. No dizziness or syncope. She does have chronic pain from arthritis and wonders if this is related to her fast HR. No LE edema, orthopnea or PND.    Past Medical History  Diagnosis Date  . Hypertension   . Insomnia   . Vertigo   . Migraine   . Thyroid nodule 2010    TSH 04/2008 = 0.296, no follow up noted in [facility]  . HLD (hyperlipidemia)   . Psychosis     followed by Dr. Keenan Bachelor [phone]  . Acid reflux   . Back pain   . Osteoarthritis     knee    Past Surgical History  Procedure Laterality Date  . Uterine fibroid surgery    . Tonsillectomy      Current Medications: Outpatient Prescriptions Prior to Visit  Medication Sig Dispense Refill  . ALPRAZolam (XANAX) 0.25 MG tablet Take 0.25 mg by mouth at bedtime as needed for anxiety.    . Biotin 1000 MCG tablet Take 3,000 mcg by mouth daily.    . DULoxetine (CYMBALTA) 60 MG capsule TAKE ONE CAPSULE BY MOUTH EVERY EVENING FOR ANXIETY/DEPRESSION  3  . quinapril (ACCUPRIL) 10 MG  tablet Take 1 tablet (10 mg total) by mouth daily. 90 tablet 3  . simvastatin (ZOCOR) 20 MG tablet TAKE 1 TABLET (20 MG TOTAL) BY MOUTH AT BEDTIME. 90 tablet 3  . amLODipine (NORVASC) 5 MG tablet TAKE 1 TABLET EVERY DAY 90 tablet 3  . doxepin (SINEQUAN) 25 MG capsule Take 25 mg by mouth.     . ranitidine (ZANTAC) 150 MG tablet TAKE 1 TABLET TWICE DAILY 180 tablet 3  . SYNTHROID 25 MCG tablet Take 25 mcg by mouth daily before breakfast.     . VOLTAREN 1 % GEL Apply 2 g topically 4 (four) times daily.   1  . cyanocobalamin 100 MCG tablet Take 100 mcg by mouth daily.    . cyclobenzaprine (FLEXERIL) 10 MG tablet Take 1 tablet (10 mg total) by mouth 3 (three) times daily as needed for muscle spasms. 30 tablet 0   No facility-administered medications prior to visit.     Allergies:   Lidocaine and Olanzapine   Social History   Social History  . Marital Status: Divorced    Spouse Name: N/A  . Number of Children: N/A  . Years of Education: N/A   Occupational History  . retired  Social History Main Topics  . Smoking status: Never Smoker   . Smokeless tobacco: Never Used  . Alcohol Use: No  . Drug Use: No  . Sexual Activity: No   Other Topics Concern  . None   Social History Narrative     Family History:  The patient's family history includes Heart disease in her father, mother, and other. There is no history of Stroke, Cancer, or Colon cancer.     ROS:   Please see the history of present illness.    ROS All other systems reviewed and are negative.   PHYSICAL EXAM:   VS:  BP 124/74 mmHg  Pulse 96  Ht 5\' 5"  (1.651 m)  Wt 157 lb 6.4 oz (71.396 kg)  BMI 26.19 kg/m2  SpO2 97%  LMP 03/27/1984   GEN: Well nourished, well developed, in no acute distress HEENT: normal Neck: no JVD, carotid bruits, or masses Cardiac: RRR; no murmurs, rubs, or gallops,no edema  Respiratory:  clear to auscultation bilaterally, normal work of breathing GI: soft, nontender, nondistended, +  BS MS: no deformity or atrophy Skin: warm and dry, no rash Neuro:  Alert and Oriented x 3, Strength and sensation are intact Psych: euthymic mood, full affect  Wt Readings from Last 3 Encounters:  08/14/15 157 lb 6.4 oz (71.396 kg)  07/07/15 159 lb 8 oz (72.349 kg)  01/01/15 155 lb (70.308 kg)      Studies/Labs Reviewed:   EKG:  EKG is NOT ordered today.    Recent Labs: 08/26/2014: Hemoglobin 15.0; Platelets 183; TSH 0.165* 12/30/2014: BUN <5*; Creatinine, Ser 0.84; Potassium 3.8; Sodium 142   Lipid Panel    Component Value Date/Time   CHOL 196 05/17/2014 1527   TRIG 168* 05/17/2014 1527   HDL 68 05/17/2014 1527   CHOLHDL 2.9 05/17/2014 1527   VLDL 34 05/17/2014 1527   LDLCALC 94 05/17/2014 1527    Additional studies/ records that were reviewed today include:  2D ECHO: 01/01/2015 LV EF: 55% - 60% Study Conclusions - Left ventricle: The cavity size was normal. Wall thickness was  normal. Systolic function was normal. The estimated ejection  fraction was in the range of 55% to 60%. Normal GLPSS at -18%.  Wall motion was normal; there were no regional wall motion  abnormalities. Doppler parameters are consistent with abnormal  left ventricular relaxation (grade 1 diastolic dysfunction). The  E/e&' ratio is <8, suggesting normal LV filling pressure. - Aortic valve: Trileaflet. Sclerosis without stenosis. There was  no regurgitation. - Mitral valve: Mildly thickened leaflets . There was mild regurgitation. - Left atrium: The atrium was normal in size. - Atrial septum: Mobile IAS. No definite PFO. - Inferior vena cava: The vessel was normal in size. The  respirophasic diameter changes were in the normal range (>= 50%), consistent with normal central venous pressure. Impressions: - LVEF 55-60%, normal wall thickness, normal wall motion, diastolic  dysfunction, normal LV filling pressure, aortic valve sclerosis,  mild MR, mobile IAS, normal IVC.  Myoview  12/2014  Nuclear stress EF: 63%. Normal LV function   The study is normal. No evidence of ischemia   This is a low risk study.  ASSESSMENT & PLAN:    DOE: Dr. Marlou Porch noted that she had been complaining of SOB for years. Myoview and 2D ECHO were essentially normal in 12/2014  Tachycardia: HR 96 today. She gets worried about her HR going to fast and over working her heart. She also feels like it contributes to her  SOB. She would like something to slow it down. I reassured her that her heart is functioning normally by her most recent echo but we can try a BB to help lower her HR. Will start Lopressor 12.5mg  BID and see if this helps. Of note, recent thyroid studies normal in 04/2015.  Hyperthyroidism s/p radioiodine ablation: recent TSH and Free T4 within normal range  HTN: BP well controlled on current regimen  Medication Adjustments/Labs and Tests Ordered: Current medicines are reviewed at length with the patient today.  Concerns regarding medicines are outlined above.  Medication changes, Labs and Tests ordered today are listed in the Patient Instructions below. Patient Instructions  Medication Instructions:  Your physician has recommended you make the following change in your medication:  1.  START Lopressor 25 mg taking 1/2 tablet twice a day   Labwork: None ordered  Testing/Procedures: None ordered  Follow-Up:  Your physician wants you to follow-up in: 4 MONTHS WITH DR. Marlou Porch   You will receive a reminder letter in the mail two months in advance. If you don't receive a letter, please call our office to schedule the follow-up appointment.  Any Other Special Instructions Will Be Listed Below (If Applicable).     If you need a refill on your cardiac medications before your next appointment, please call your pharmacy.       Signed, Angelena Form, PA-C  08/14/2015 11:14 AM    [facility] [place], [place], [place]  [address] Phone: [phone]; Fax: [phone]

## 2015-08-14 ENCOUNTER — Encounter: Payer: Self-pay | Admitting: Physician Assistant

## 2015-08-14 ENCOUNTER — Ambulatory Visit (INDEPENDENT_AMBULATORY_CARE_PROVIDER_SITE_OTHER): Payer: Medicare Other | Admitting: Physician Assistant

## 2015-08-14 ENCOUNTER — Other Ambulatory Visit: Payer: Self-pay | Admitting: *Deleted

## 2015-08-14 VITALS — BP 124/74 | HR 96 | Ht 65.0 in | Wt 157.4 lb

## 2015-08-14 DIAGNOSIS — R000 Tachycardia, unspecified: Secondary | ICD-10-CM

## 2015-08-14 MED ORDER — METOPROLOL TARTRATE 25 MG PO TABS: 12.5000 mg | ORAL_TABLET | Freq: Two times a day (BID) | ORAL | Status: DC

## 2015-08-14 NOTE — Telephone Encounter (Signed)
Patient called and stated that she was just seen here and had [facility].. It was sent to her mail order pharmacy, but she needs it today as she is going out of town.  She is there waiting now. Rx sent.

## 2015-08-14 NOTE — Patient Instructions (Addendum)
Medication Instructions:  Your physician has recommended you make the following change in your medication:  1.  START Lopressor 25 mg taking 1/2 tablet twice a day   Labwork: None ordered  Testing/Procedures: None ordered  Follow-Up:  Your physician wants you to follow-up in: 4 MONTHS WITH DR. Marlou Porch   You will receive a reminder letter in the mail two months in advance. If you don't receive a letter, please call our office to schedule the follow-up appointment.  Any Other Special Instructions Will Be Listed Below (If Applicable).     If you need a refill on your cardiac medications before your next appointment, please call your pharmacy.

## 2015-09-05 ENCOUNTER — Other Ambulatory Visit: Payer: Self-pay

## 2015-09-05 MED ORDER — SIMVASTATIN 20 MG PO TABS: ORAL_TABLET | ORAL | Status: DC

## 2015-09-05 NOTE — Telephone Encounter (Signed)
[facility]ing [facility] [address].

## 2015-09-25 DIAGNOSIS — F331 Major depressive disorder, recurrent, moderate: Secondary | ICD-10-CM | POA: Diagnosis not present

## 2015-09-25 DIAGNOSIS — F411 Generalized anxiety disorder: Secondary | ICD-10-CM | POA: Diagnosis not present

## 2015-10-08 ENCOUNTER — Other Ambulatory Visit: Payer: Self-pay | Admitting: *Deleted

## 2015-10-08 DIAGNOSIS — I10 Essential (primary) hypertension: Secondary | ICD-10-CM

## 2015-10-08 MED ORDER — QUINAPRIL HCL 10 MG PO TABS: 10.0000 mg | ORAL_TABLET | Freq: Every day | ORAL | 3 refills | Status: DC

## 2015-10-08 NOTE — Telephone Encounter (Signed)
Has aug appt PCP 

## 2015-10-13 ENCOUNTER — Ambulatory Visit (INDEPENDENT_AMBULATORY_CARE_PROVIDER_SITE_OTHER): Payer: Medicare Other | Admitting: Internal Medicine

## 2015-10-13 ENCOUNTER — Encounter: Payer: Self-pay | Admitting: Internal Medicine

## 2015-10-13 DIAGNOSIS — R000 Tachycardia, unspecified: Secondary | ICD-10-CM

## 2015-10-13 DIAGNOSIS — Z87310 Personal history of (healed) osteoporosis fracture: Secondary | ICD-10-CM

## 2015-10-13 DIAGNOSIS — R296 Repeated falls: Secondary | ICD-10-CM

## 2015-10-13 DIAGNOSIS — Z9181 History of falling: Secondary | ICD-10-CM | POA: Diagnosis not present

## 2015-10-13 DIAGNOSIS — M810 Age-related osteoporosis without current pathological fracture: Secondary | ICD-10-CM | POA: Insufficient documentation

## 2015-10-13 DIAGNOSIS — H8110 Benign paroxysmal vertigo, unspecified ear: Secondary | ICD-10-CM

## 2015-10-13 DIAGNOSIS — I10 Essential (primary) hypertension: Secondary | ICD-10-CM | POA: Diagnosis not present

## 2015-10-13 DIAGNOSIS — Z79899 Other long term (current) drug therapy: Secondary | ICD-10-CM

## 2015-10-13 MED ORDER — ALENDRONATE SODIUM 70 MG PO TABS: 70.0000 mg | ORAL_TABLET | ORAL | 3 refills | Status: AC

## 2015-10-13 MED ORDER — MECLIZINE HCL 25 MG PO TABS: 25.0000 mg | ORAL_TABLET | Freq: Three times a day (TID) | ORAL | 2 refills | Status: DC | PRN

## 2015-10-13 NOTE — Progress Notes (Signed)
   CC: Establish care  HPI:  [name] is a 70 y.o. pmhx of HTN, HLD, GERD, cholelithiasis, and acquired hypothyroidism who presents today for routine follow up. Today, she complains of worsening symptoms of vertigo over the past few months. She was previously diagnosed with BPPV and taking meclizine, but stopped the medicine years ago as she has been asymptomatic. Over the past few months her vertigo has returned resulting in multiple falls. She has a history of fragility fractures (sacrum, L humerus) from prior falls in 2013. She denies any new injury or trauma. She is asking today about starting a medicine for her "bone strength". In addition, patient says she was seen by her cardiologist 2 months ago for symptoms of tachycardia. She was started on metoprolol 12.5 BID and reports significant improvement in palpitations.   Past Medical History:  Diagnosis Date  . Acid reflux   . Back pain   . HLD (hyperlipidemia)   . Hypertension   . Insomnia   . Migraine   . Osteoarthritis    knee  . Psychosis    followed by Dr. Keenan Bachelor [phone]  . Thyroid nodule 2010   TSH 04/2008 = 0.296, no follow up noted in [facility]  . Vertigo     Review of Systems:  Otherwise negative  Physical Exam Constitutional: NAD, appears comfortable HEENT: Atraumatic, normocephalic. PERRL, anicteric sclera.  Neck: Supple, trachea midline.  Cardiovascular: RRR, no murmurs, rubs, or gallops.  Pulmonary/Chest: CTAB, no wheezes, rales, or rhonchi. No chest wall abnormalities.  Abdominal: Soft, non tender, non distended. +BS.  Extremities: Warm and well perfused. Distal pulses intact. No edema.  Neurological: A&Ox3, CN II - XII grossly intact.  Skin: No rashes or erythema  Psychiatric: Normal mood and affect  Vitals:   10/13/15 1359  BP: 134/69  Pulse: 80  Temp: 98.2 F (36.8 C)  TempSrc: Oral  SpO2: 97%  Weight: 158 lb 3.2 oz (71.8 kg)    Assessment & Plan:   See Encounters Tab for problem  based charting.  Patient seen with Dr. Evette Doffing

## 2015-10-13 NOTE — Patient Instructions (Signed)
Epley Maneuver Self-Care  WHAT IS THE EPLEY MANEUVER?  The Epley maneuver is an exercise you can do to relieve symptoms of benign paroxysmal positional vertigo (BPPV). This condition is often just referred to as vertigo. BPPV is caused by the movement of tiny crystals (canaliths) inside your inner ear. The accumulation and movement of canaliths in your inner ear causes a sudden spinning sensation (vertigo) when you move your head to certain positions. Vertigo usually lasts about 30 seconds. BPPV usually occurs in just one ear. If you get vertigo when you lie on your left side, you probably have BPPV in your left ear. Your health care provider can tell you which ear is involved.   BPPV may be caused by a head injury. Many people older than 50 get BPPV for unknown reasons. If you have been diagnosed with BPPV, your health care provider may teach you how to do this maneuver. BPPV is not life threatening (benign) and usually goes away in time.   WHEN SHOULD I PERFORM THE EPLEY MANEUVER?  You can do this maneuver at home whenever you have symptoms of vertigo. You may do the Epley maneuver up to 3 times a day until your symptoms of vertigo go away.  HOW SHOULD I DO THE EPLEY MANEUVER?  1. Sit on the edge of a bed or table with your back straight. Your legs should be extended or hanging over the edge of the bed or table.    2. Turn your head halfway toward the affected ear.    3. Lie backward quickly with your head turned until you are lying flat on your back. You may want to position a pillow under your shoulders.    4. Hold this position for 30 seconds. You may experience an attack of vertigo. This is normal. Hold this position until the vertigo stops.  5. Then turn your head to the opposite direction until your unaffected ear is facing the floor.    6. Hold this position for 30 seconds. You may experience an attack of vertigo. This is normal. Hold this position until the vertigo stops.  7. Now turn your whole body to  the same side as your head. Hold for another 30 seconds.    8. You can then sit back up.  ARE THERE RISKS TO THIS MANEUVER?  In some cases, you may have other symptoms (such as changes in your vision, weakness, or numbness). If you have these symptoms, stop doing the maneuver and call your health care provider. Even if doing these maneuvers relieves your vertigo, you may still have dizziness. Dizziness is the sensation of light-headedness but without the sensation of movement. Even though the Epley maneuver may relieve your vertigo, it is possible that your symptoms will return within 5 years.  WHAT SHOULD I DO AFTER THIS MANEUVER?  After doing the Epley maneuver, you can return to your normal activities. Ask your doctor if there is anything you should do at home to prevent vertigo. This may include:  · Sleeping with two or more pillows to keep your head elevated.  · Not sleeping on the side of your affected ear.  · Getting up slowly from bed.  · Avoiding sudden movements during the day.  · Avoiding extreme head movement, like looking up or bending over.  · Wearing a cervical collar to prevent sudden head movements.  WHAT SHOULD I DO IF MY SYMPTOMS GET WORSE?  Call your health care provider if your vertigo gets worse. Call your provider right way if   you have other symptoms, including:   · Nausea.  · Vomiting.  · Headache.  · Weakness.  · Numbness.  · Vision changes.     This information is not intended to replace advice given to you by your health care provider. Make sure you discuss any questions you have with your health care provider.     Document Released: 02/06/2013 Document Reviewed: 02/06/2013  Elsevier Interactive Patient Education ©2016 [employer]

## 2015-10-13 NOTE — Assessment & Plan Note (Signed)
Patient reports a history of BPPV previously on meclizine, but has been asymptomatic for many years. Vertigo has returned over the past few months resulting in multiple falls. She has a history of fragility fractures (2013) from prior episodes of vertigo, but denies any new injury or trauma. Refer to PT for vestibular rehab. Meclizine 25 mg TID prn for dizziness. Follow up in 3 months.

## 2015-10-13 NOTE — Assessment & Plan Note (Signed)
DEXA scan in 2012 with evidence of osteopenia, however, patient with a history of fragility fractures (L humerus, sacrum). Requesting treatment today for her "bone strength" due to increase frequency of falls from her vertigo. Will start alendronate 70 mg weekly. Discussed with patient potential side effects (upset stomach, heartburn, nausea etc.) Advised patient to take on an empty stomach and to sit upright for 30 minutes after taking. Given 1 year supply, will plan to treat for 5 years and reassess at that time. For fall prevention - please see problem based charting for BPPV.

## 2015-10-13 NOTE — Assessment & Plan Note (Signed)
Well controlled. BP today 134/ 69 (goal <150/90). Continue amlodipine 5 mg daily, quinapril 10 mg daily, and lopressor 12.5 mg BID.

## 2015-10-13 NOTE — Assessment & Plan Note (Addendum)
Patient was seen by cardiology 08/12/2015 and started on lopressor 12.5 mg BID. ECHO (12/2014) with normal EF 55-65% and Myoview stress test (12/2014) with low risk. Patient says symptoms have significantly improved since starting the medicine and she feels better over all. Continue lopressor, follow up with cardiology for scheduled appointment in October.

## 2015-10-15 NOTE — Progress Notes (Signed)
Internal Medicine Clinic Attending  I saw and evaluated the patient.  I personally confirmed the key portions of the history and exam documented by Dr. Philipp Ovens and I reviewed pertinent patient test results.  The assessment, diagnosis, and plan were formulated together and I agree with the documentation in the resident's note.  One correction: patient presented for chief complaint of vertigo follow up.

## 2015-10-31 DIAGNOSIS — E890 Postprocedural hypothyroidism: Secondary | ICD-10-CM | POA: Diagnosis not present

## 2015-10-31 DIAGNOSIS — E042 Nontoxic multinodular goiter: Secondary | ICD-10-CM | POA: Diagnosis not present

## 2015-11-06 DIAGNOSIS — H8110 Benign paroxysmal vertigo, unspecified ear: Secondary | ICD-10-CM | POA: Diagnosis not present

## 2015-11-06 DIAGNOSIS — R42 Dizziness and giddiness: Secondary | ICD-10-CM | POA: Diagnosis not present

## 2015-11-14 ENCOUNTER — Telehealth: Payer: Self-pay | Admitting: *Deleted

## 2015-11-14 NOTE — Telephone Encounter (Signed)
Need to know if she should take the flu shot? Had bad flu symptoms the PM after gettign the flu shot.   [facility] need an order for a bone density study.

## 2015-11-14 NOTE — Telephone Encounter (Signed)
Sending to dr Philipp Ovens and Robb Matar.

## 2015-11-16 ENCOUNTER — Encounter: Payer: Self-pay | Admitting: Internal Medicine

## 2015-11-16 NOTE — Telephone Encounter (Signed)
Yes, patient should still get the flu shot if she has not received it yet this year. It is common for people to get a mild reaction after receiving the injection. It is a result of immune system activation and is not from the actual flu. When people do experience symptoms, they are usually mild and resolves after a day or two. She does not need a bone scan. We are already treating her for osteoporosis and a bone scan would not change our management at this time. Thanks!

## 2015-11-17 NOTE — Telephone Encounter (Signed)
Called pt relayed dr [employer] message, she has appt this week in [place] will speak to md about her painful knees

## 2015-11-20 ENCOUNTER — Telehealth: Payer: Self-pay | Admitting: Internal Medicine

## 2015-11-20 NOTE — Telephone Encounter (Signed)
APT. REMINDER CALL, LMTCB °

## 2015-11-21 ENCOUNTER — Ambulatory Visit (INDEPENDENT_AMBULATORY_CARE_PROVIDER_SITE_OTHER): Payer: Medicare Other | Admitting: Internal Medicine

## 2015-11-21 VITALS — BP 127/71 | HR 82 | Temp 98.1°F | Ht 65.0 in | Wt 159.7 lb

## 2015-11-21 DIAGNOSIS — M1712 Unilateral primary osteoarthritis, left knee: Secondary | ICD-10-CM | POA: Diagnosis present

## 2015-11-21 DIAGNOSIS — M158 Other polyosteoarthritis: Secondary | ICD-10-CM

## 2015-11-21 DIAGNOSIS — Z23 Encounter for immunization: Secondary | ICD-10-CM

## 2015-11-21 DIAGNOSIS — Z0000 Encounter for general adult medical examination without abnormal findings: Secondary | ICD-10-CM

## 2015-11-21 NOTE — Progress Notes (Signed)
   CC: Knee pain  HPI:  Doris [name] is a 70 y.o. female with PMH as listed below who presents with complaint of knee pain.  Knee pain: Patient states that she follows with orthopedics for her knee pain. She says an MRI was completed on her left knee and was told that this showed tendon tears, bone on bone disease, and bone spur. Records are not available to [place]. She would like to have her records sent to [place] and will sign a release form. She does have Tylenol #3 for pain but uses this sparingly. She has Voltaren gel at home which provides some relief. She does not take oral NSAIDs due to upset stomach.  Preventative Health Care: Patient agreeable for flu shot today.  Jury Duty: Patient would like paperwork signed to be excused from jury duty. When asked, she was unable to provide me a valid reason to be excused and states that she will attend jury duty.    Past Medical History:  Diagnosis Date  . Acid reflux   . Back pain   . HLD (hyperlipidemia)   . Hypertension   . Insomnia   . Migraine   . Osteoarthritis    knee  . Psychosis    followed by Dr. Keenan Bachelor [phone]  . Thyroid nodule 2010   TSH 04/2008 = 0.296, no follow up noted in [facility]  . Vertigo     Review of Systems:   Review of Systems  Respiratory: Negative for shortness of breath.   Cardiovascular: Negative for chest pain.  Musculoskeletal: Positive for joint pain. Negative for falls.     Physical Exam:  Vitals:   11/21/15 1104  BP: 127/71  Pulse: 82  Temp: 98.1 F (36.7 C)  TempSrc: Oral  SpO2: 98%  Weight: 159 lb 11.2 oz (72.4 kg)  Height: 5\' 5"  (1.651 m)   Physical Exam  Constitutional: She is oriented to person, place, and time. She appears well-developed and well-nourished. No distress.  Cardiovascular: Normal rate and regular rhythm.   Pulmonary/Chest: Effort normal. No respiratory distress. She has no wheezes. She has no rales.  Musculoskeletal: Normal range of motion. She exhibits no  edema or tenderness.  Neurological: She is alert and oriented to person, place, and time.    Assessment & Plan:   See Encounters Tab for problem based charting.  Patient discussed with Dr. Dareen Piano

## 2015-11-21 NOTE — Patient Instructions (Signed)
It was a pleasure to meet you Doris [name].  We will try to start the process to get records from your orthopedic doctor.  You can try Tylenol 1000 mg up to three times per day to help with your pain. You can continue the Voltaren gel.  We are giving you the flu shot today.  Please continue your other medications as prescribed and follow up with Dr. Philipp Ovens.

## 2015-11-22 NOTE — Assessment & Plan Note (Signed)
Flu shot given

## 2015-11-22 NOTE — Assessment & Plan Note (Signed)
Patient states that she follows with orthopedics for her knee pain. She says an MRI was completed on her left knee and was told that this showed tendon tears, bone on bone disease, and bone spur. Records are not available to [place]. She would like to have her records sent to [place] and will sign a release form. She does have Tylenol #3 for pain but uses this sparingly. She has Voltaren gel at home which provides some relief. She does not take oral NSAIDs due to upset stomach.  Advised patient to have orthopedic records and MRI results sent to [place] and sign release forms. -Continue Voltaren gel as needed -Can try Tylenol extra strength for moderate pain, has Tylenol #3 for severe pain

## 2015-11-24 NOTE — Progress Notes (Signed)
Internal Medicine Clinic Attending  Case discussed with Dr. Patel,Vishal at the time of the visit.  We reviewed the resident's history and exam and pertinent patient test results.  I agree with the assessment, diagnosis, and plan of care documented in the resident's note.  

## 2015-12-01 DIAGNOSIS — Z1231 Encounter for screening mammogram for malignant neoplasm of breast: Secondary | ICD-10-CM | POA: Diagnosis not present

## 2015-12-07 IMAGING — NM NM MYOCAR MULTI W/ SPECT
3 series · 18 of 18 positions shown · non-contrast
Comparison: none

[Series 1: rest_(id)_sa · 6.5mm · 6.51mm/px · 6 of 64 frames shown]
[frame 6/64]
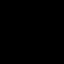
[frame 16/64]
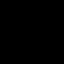
[frame 27/64]
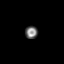
[frame 38/64]
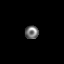
[frame 48/64]
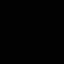
[frame 59/64]
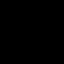

[Series 1: stress_(id)_sa · 6.5mm · 6.51mm/px · 6 of 64 frames shown (1 of 2)]
[frame 6/64]
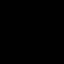
[frame 16/64]
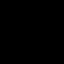
[frame 27/64]
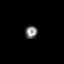
[frame 38/64]
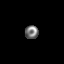
[frame 48/64]
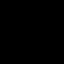
[frame 59/64]
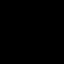

[Series 1: stress_(id)_sa · 6.5mm · 6.51mm/px · 6 of 512 frames shown (2 of 2)]
[frame 43/512]
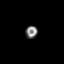
[frame 128/512]
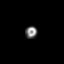
[frame 214/512]
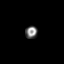
[frame 299/512]
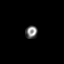
[frame 384/512]
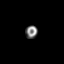
[frame 470/512]
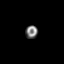

[18 of 18 positions shown; findings below may reference images not displayed]

Canned report from images found in remote index.

Refer to host system for actual result text.

## 2015-12-25 DIAGNOSIS — F411 Generalized anxiety disorder: Secondary | ICD-10-CM | POA: Diagnosis not present

## 2015-12-25 DIAGNOSIS — F331 Major depressive disorder, recurrent, moderate: Secondary | ICD-10-CM | POA: Diagnosis not present

## 2016-01-05 ENCOUNTER — Encounter: Payer: Self-pay | Admitting: Internal Medicine

## 2016-01-14 ENCOUNTER — Ambulatory Visit: Payer: Medicare Other | Admitting: Cardiology

## 2016-01-22 ENCOUNTER — Encounter: Payer: Self-pay | Admitting: Cardiology

## 2016-01-22 ENCOUNTER — Ambulatory Visit (INDEPENDENT_AMBULATORY_CARE_PROVIDER_SITE_OTHER): Payer: Medicare Other | Admitting: Cardiology

## 2016-01-22 VITALS — BP 142/88 | HR 90 | Ht 64.0 in | Wt 159.0 lb

## 2016-01-22 DIAGNOSIS — R0609 Other forms of dyspnea: Secondary | ICD-10-CM

## 2016-01-22 DIAGNOSIS — R000 Tachycardia, unspecified: Secondary | ICD-10-CM

## 2016-01-22 MED ORDER — METOPROLOL TARTRATE 25 MG PO TABS: 25.0000 mg | ORAL_TABLET | Freq: Two times a day (BID) | ORAL | 11 refills | Status: DC

## 2016-01-22 NOTE — Progress Notes (Signed)
Cardiology Office Note   Date:  01/22/2016   ID:  Doris [name], DOB 04-27-1945, MRN [id number]  PCP:  Velna Ochs, MD  Cardiologist:   Candee Furbish, MD       History of Present Illness: Doris [name] is a 71 y.o. female who presents for evaluation of tachycardia. Prior office note from 11/21/14 reviewed. She had been reporting palpitations and shortness of breath for years. No chest pain or loss of consciousness. No excessive caffeine. Her free T4 is currently normal however she continues to have tachycardia. Stress test years ago was performed.  She is also been noticing shortness of breath with exertional activity. She has stopped exercising because of this. She is noticed this over the last few months. Wonder if this. Correlation with hyperthyroidism. No chest pain. She did have a CT scan of chest which showed coronary calcification of LAD and RCA.  Her tachycardia has been present as well as shortness of breath since February 2016. Overall reassuring workup with nuclear stress test and echocardiogram. Low-dose metoprolol started which has helped a little bit but she still continues to have some increased heart rhythm. We decided to increase her metoprolol to 25 mg twice a day on 01/22/16.  Her thyroid has been checked and has been excellent. Mild lower extremity swelling in the evening hours.  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  Past Medical History:  Diagnosis Date  . Acid reflux   . Back pain   . HLD (hyperlipidemia)   . Hypertension   . Insomnia   . Migraine   . Osteoarthritis    knee  . Psychosis    followed by Dr. Keenan Bachelor [phone]  . Thyroid nodule 2010   TSH 04/2008 = 0.296, no follow up noted in [facility]  . Vertigo     Past Surgical History:  Procedure Laterality Date  . TONSILLECTOMY    . UTERINE FIBROID SURGERY       Current Outpatient Prescriptions  Medication Sig Dispense Refill  . alendronate (FOSAMAX) 70 MG tablet Take 1 tablet (70 mg  total) by mouth every 7 (seven) days. Take with a full glass of water on an empty stomach. 12 tablet 3  . ALPRAZolam (XANAX) 0.25 MG tablet Take 0.25 mg by mouth at bedtime as needed for anxiety.    Marland Kitchen amLODipine (NORVASC) 5 MG tablet Take 5 mg by mouth daily.    . Biotin 1000 MCG tablet Take 3,000 mcg by mouth daily.    Marland Kitchen doxepin (SINEQUAN) 25 MG capsule Take 25 mg by mouth daily.    . DULoxetine (CYMBALTA) 60 MG capsule TAKE ONE CAPSULE BY MOUTH EVERY EVENING FOR ANXIETY/DEPRESSION  3  . levothyroxine (SYNTHROID, LEVOTHROID) 25 MCG tablet Take 25 mcg by mouth daily before breakfast.    . metoprolol tartrate (LOPRESSOR) 25 MG tablet Take 1 tablet (25 mg total) by mouth 2 (two) times daily. 60 tablet 11  . quinapril (ACCUPRIL) 10 MG tablet Take 1 tablet (10 mg total) by mouth daily. 90 tablet 3  . ranitidine (ZANTAC) 150 MG tablet Take 150 mg by mouth daily as needed for heartburn.    . simvastatin (ZOCOR) 20 MG tablet TAKE 1 TABLET (20 MG TOTAL) BY MOUTH AT BEDTIME. 90 tablet 2   No current facility-administered medications for this visit.     Allergies:   Lidocaine and Olanzapine    Social History:  The patient  reports that she has never smoked. She has never used smokeless tobacco. She reports  that she does not drink alcohol or use drugs.   Family History:  The patient's family history includes Heart disease in her father, mother, and other. her mother died of congestive heart failure at a later age.   ROS:  Please see the history of present illness.   Otherwise, review of systems are positive for sweating, shortness of breath with activity, shortness of breath when laying down, joint swelling, dizziness, constipation.   All other systems are reviewed and negative.    PHYSICAL EXAM: VS:  BP (!) 142/88   Pulse 90   Ht 5\' 4"  (1.626 m)   Wt 159 lb (72.1 kg)   LMP 03/27/1984   BMI 27.29 kg/m  , BMI Body mass index is 27.29 kg/m. GEN: Well nourished, well developed, in no acute  distress  HEENT: normal  Neck: no JVD, carotid bruits, or masses Cardiac: Regular but mildly tachycardic; no murmurs, rubs, or gallops,no edema  Respiratory:  clear to auscultation bilaterally, normal work of breathing GI: soft, nontender, nondistended, + BS MS: no deformity or atrophy  Skin: warm and dry, no rash Neuro:  Strength and sensation are intact Psych: euthymic mood, full affect   EKG:  No EKG performed today however previous reviewed and normal sinus rhythm, heart rate 87.  Echocardiogram 12/2014-normal EF, aortic valve sclerosis, mild MR.  Nuclear stress test 2016-low risk, no ischemia.  Recent Labs: No results found for requested labs within last 8760 hours.    Lipid Panel    Component Value Date/Time   CHOL 196 05/17/2014 1527   TRIG 168 (H) 05/17/2014 1527   HDL 68 05/17/2014 1527   CHOLHDL 2.9 05/17/2014 1527   VLDL 34 05/17/2014 1527   LDLCALC 94 05/17/2014 1527      Wt Readings from Last 3 Encounters:  01/22/16 159 lb (72.1 kg)  11/21/15 159 lb 11.2 oz (72.4 kg)  10/13/15 158 lb 3.2 oz (71.8 kg)      Other studies Reviewed: Additional studies/ records that were reviewed today include: Prior office notes, lab work, CT scan. Review of the above records demonstrates: none   ASSESSMENT AND PLAN:  Dyspnea on exertion  -Echo and NUC reassuring.  free T4 is now normal. Low dose beta blocker. I will increase to 25 mg twice a day. Coronary artery calcification noted in the LAD and RCA on CT scan, personally viewed. Nuclear stress test low risk. Reassurance has been given.  Hyperthyroidism status post radioactive iodine ablation  - Dr. Buddy Duty  - free T4 normal  Coronary artery calcification  - LAD, RCA  - Hyperthyroidism can be an accelerated for atherosclerosis.  Tachycardia  - Low dose beta blocker started on 08/14/15, I will increase to 25 mg twice a day. She would like a little bit slower heart rate.  - This low level of tachycardia should not  cause tachycardia mediated cardiomyopathy. Overall reassurance.  Dependent edema  - Mild swelling at the end of the day. Reassurance.  Current medicines are reviewed at length with the patient today.  The patient does not have concerns regarding medicines.  The following changes have been made:  no change  Labs/ tests ordered today include:   No orders of the defined types were placed in this encounter.    Disposition:   FU with [facility] in 12 months  Signed, Candee Furbish, MD  01/22/2016 10:11 AM    [facility] [address], [place], [place]  [address] Phone: [phone]; Fax: [phone]   

## 2016-01-22 NOTE — Patient Instructions (Signed)
Medication Instructions:  Please increase your Metoprolol to 25 mg twice a day. Continue all other medications as listed.  Follow-Up: Follow up in 1 year with Dr. Marlou Porch.  You will receive a letter in the mail 2 months before you are due.  Please call us when you receive this letter to schedule your follow up appointment.  If you need a refill on your cardiac medications before your next appointment, please call your pharmacy.  Thank you for choosing [facility]!!

## 2016-01-23 DIAGNOSIS — H1851 Endothelial corneal dystrophy: Secondary | ICD-10-CM | POA: Diagnosis not present

## 2016-01-23 DIAGNOSIS — H11823 Conjunctivochalasis, bilateral: Secondary | ICD-10-CM | POA: Diagnosis not present

## 2016-01-23 DIAGNOSIS — H10413 Chronic giant papillary conjunctivitis, bilateral: Secondary | ICD-10-CM | POA: Diagnosis not present

## 2016-03-16 DIAGNOSIS — H10413 Chronic giant papillary conjunctivitis, bilateral: Secondary | ICD-10-CM | POA: Diagnosis not present

## 2016-03-16 DIAGNOSIS — H1851 Endothelial corneal dystrophy: Secondary | ICD-10-CM | POA: Diagnosis not present

## 2016-03-16 DIAGNOSIS — H11121 Conjunctival concretions, right eye: Secondary | ICD-10-CM | POA: Diagnosis not present

## 2016-03-18 DIAGNOSIS — F331 Major depressive disorder, recurrent, moderate: Secondary | ICD-10-CM | POA: Diagnosis not present

## 2016-03-18 DIAGNOSIS — F411 Generalized anxiety disorder: Secondary | ICD-10-CM | POA: Diagnosis not present

## 2016-03-28 IMAGING — DX DG CHEST 2V
2 series · 2 of 2 positions shown · non-contrast
Comparison: 07/27/2013 and earlier.

CLINICAL DATA: 68-year-old female who fell 2 weeks ago with
continued central and left chest pain radiating to both upper
extremities. Shortness of Breath. Initial encounter.

EXAM:
CHEST  2 VIEW

[chest lat (1 of 2)]
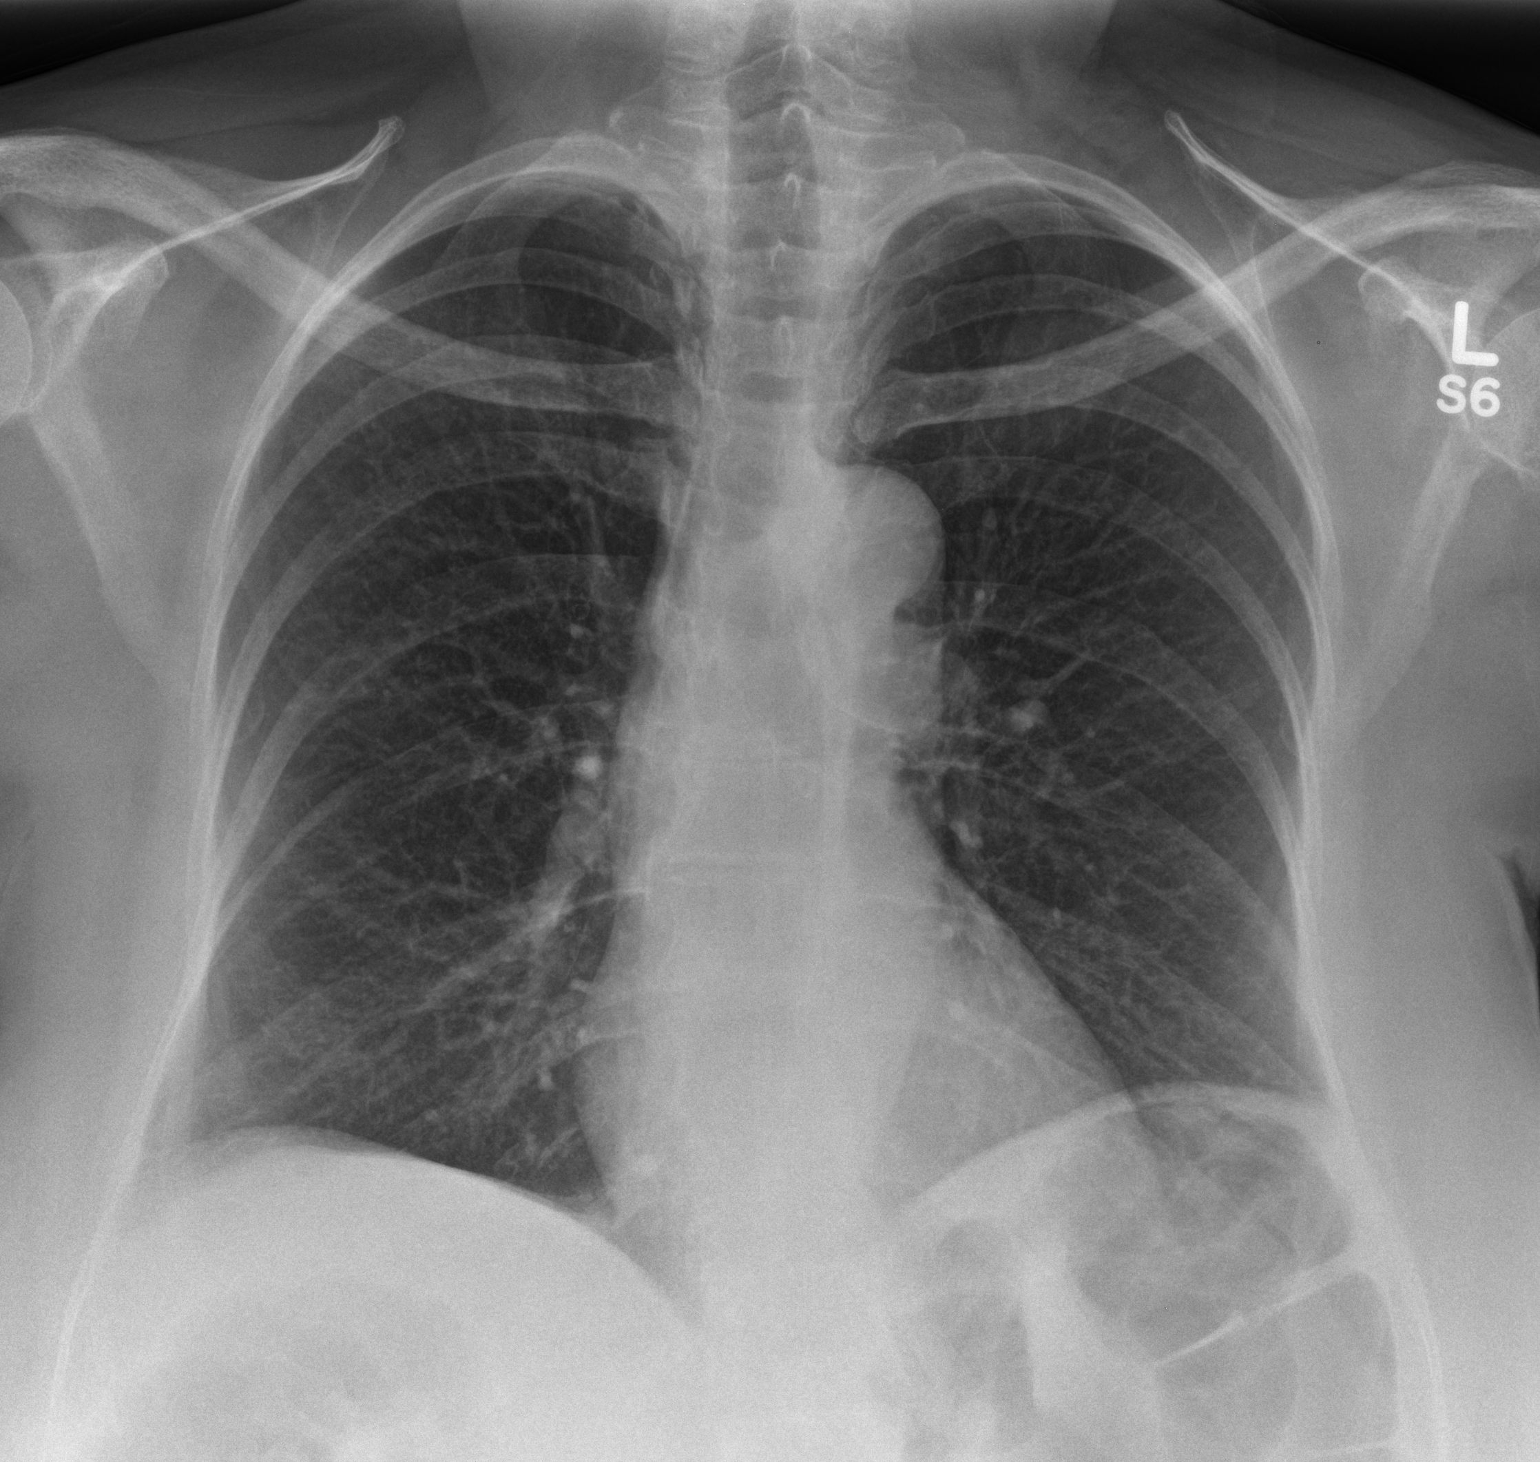

[chest lat (2 of 2)]
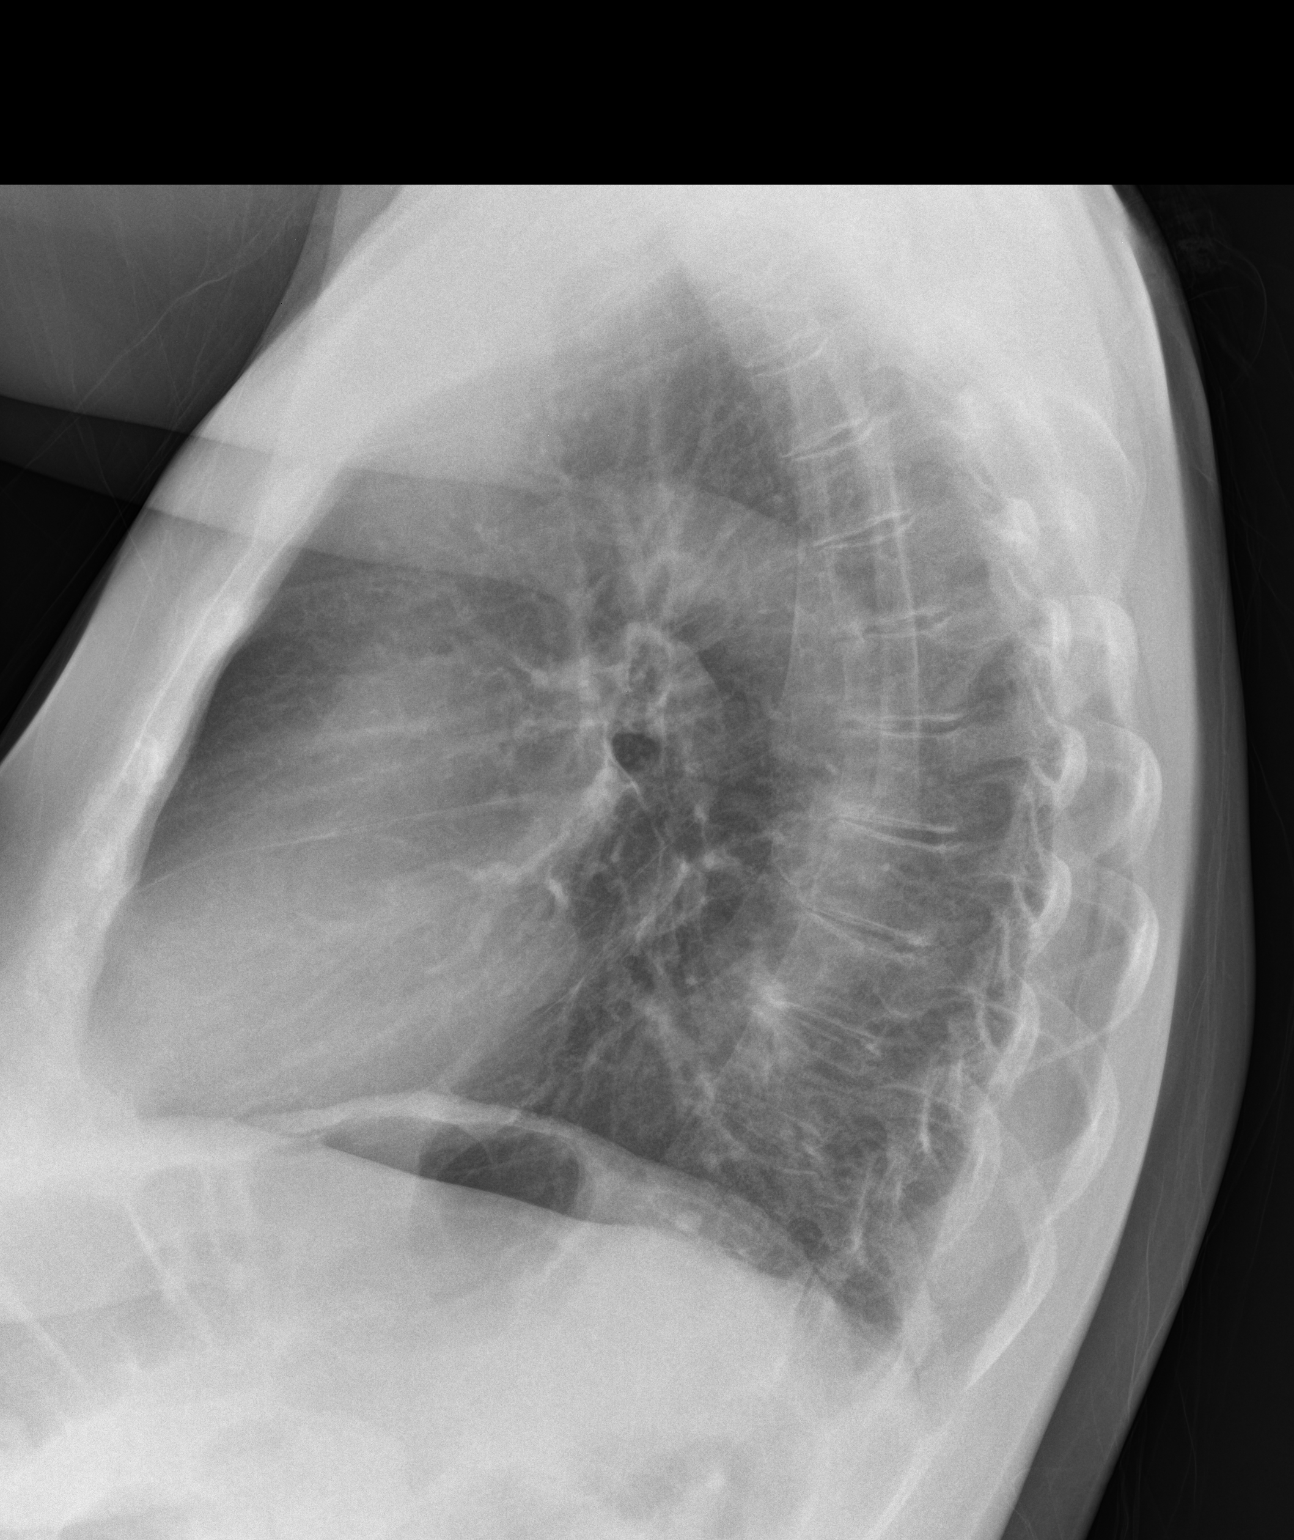

[2 of 2 positions shown; findings below may reference images not displayed]

FINDINGS: Stable lung volumes. Normal cardiac size and mediastinal contours.
Visualized tracheal air column is within normal limits. No
pneumothorax or pleural effusion. No acute pulmonary opacity. Stable
and normal anterior clear space. No displaced rib fracture
identified.
IMPRESSION: No acute cardiopulmonary abnormality or acute traumatic injury
identified.

## 2016-04-20 DIAGNOSIS — H10413 Chronic giant papillary conjunctivitis, bilateral: Secondary | ICD-10-CM | POA: Diagnosis not present

## 2016-04-20 DIAGNOSIS — H1851 Endothelial corneal dystrophy: Secondary | ICD-10-CM | POA: Diagnosis not present

## 2016-04-20 DIAGNOSIS — H11121 Conjunctival concretions, right eye: Secondary | ICD-10-CM | POA: Diagnosis not present

## 2016-04-20 DIAGNOSIS — H16223 Keratoconjunctivitis sicca, not specified as Sjogren's, bilateral: Secondary | ICD-10-CM | POA: Diagnosis not present

## 2016-04-29 ENCOUNTER — Encounter: Payer: Self-pay | Admitting: Internal Medicine

## 2016-04-29 ENCOUNTER — Ambulatory Visit (INDEPENDENT_AMBULATORY_CARE_PROVIDER_SITE_OTHER): Payer: Medicare Other | Admitting: Internal Medicine

## 2016-04-29 VITALS — BP 146/77 | HR 81 | Temp 98.2°F | Wt 159.4 lb

## 2016-04-29 DIAGNOSIS — I10 Essential (primary) hypertension: Secondary | ICD-10-CM

## 2016-04-29 DIAGNOSIS — Z79899 Other long term (current) drug therapy: Secondary | ICD-10-CM | POA: Diagnosis not present

## 2016-04-29 DIAGNOSIS — E785 Hyperlipidemia, unspecified: Secondary | ICD-10-CM

## 2016-04-29 DIAGNOSIS — Z0000 Encounter for general adult medical examination without abnormal findings: Secondary | ICD-10-CM

## 2016-04-29 MED ORDER — ATORVASTATIN CALCIUM 40 MG PO TABS: 40.0000 mg | ORAL_TABLET | Freq: Every day | ORAL | 11 refills | Status: DC

## 2016-04-29 MED ORDER — AMLODIPINE BESYLATE 10 MG PO TABS: 10.0000 mg | ORAL_TABLET | Freq: Every day | ORAL | 2 refills | Status: DC

## 2016-04-29 NOTE — Assessment & Plan Note (Addendum)
Patient is taking quinapril 10 mg daily, metoprolol 25 mg daily, and amlodipine 5 mg daily. BP today is 146/77. On chart review it appears she was elevated 948N systolic at her last visit as well. She is asymptomatic and feels well. BP goal is < 140/90.  -- Increase amlodipine to 10 mg daily -- Continue quinapril 10 mg daily and metoprolol 25 mg BID -- F/u 1 month for BP recheck

## 2016-04-29 NOTE — Assessment & Plan Note (Addendum)
Patient is currently taking simvastatin 20 mg daily for primary prevention. She has been on this medication for many years. Today, she reports that she believes her pharmacy recently switched manufacturers and now thinks the new pill is giving her insomnia. I explained to the patient that this is highly unlikely. Her current 10 year ASCVD risk is 15.9% and meets criteria for a high intensity statin. Will plan to stop simvastatin and switch to atorvastatin 40 mg daily. Patient is agreeable to this plan. -- Stop simvastatin -- Start atorvastatin 40 mg daily

## 2016-04-29 NOTE — Assessment & Plan Note (Signed)
Patient requested a breast exam today. Says she tries to do regular self exams in the shower but that her breast are often tender and it is difficult for her. Her last mammogram in October of 2017 was normal. She denies any lumps or bumps or any areas of concern. Breast exam today was normal and reassuring.  -- Repeat mammogram October 2019

## 2016-04-29 NOTE — Progress Notes (Signed)
   CC: BP follow up  HPI:  [name] is a 71 y.o.  with past medical history outlined below here for BP follow up. For the details of today's visit please refer to the assessment and plan.  Past Medical History:  Diagnosis Date  . Acid reflux   . Back pain   . HLD (hyperlipidemia)   . Hypertension   . Insomnia   . Migraine   . Osteoarthritis    knee  . Psychosis    followed by Dr. Keenan [name] [phone]  . Thyroid nodule 2010   TSH 04/2008 = 0.296, no follow up noted in [facility]  . Vertigo     Review of Systems:  All pertinents listed in HPI, otherwise negative  Physical Exam:  Vitals:   04/29/16 1351  BP: (!) 146/77  Pulse: 81  Temp: 98.2 F (36.8 C)  TempSrc: Oral  SpO2: 99%  Weight: 159 lb 6.4 oz (72.3 kg)    Constitutional: NAD, appears comfortable HEENT: Atraumatic, normocephalic. PERRL, anicteric sclera.  Cardiovascular: RRR,  Pulmonary/Chest: CTAB, Normal breast exam  Extremities: Warm and well perfused. No edema.   Assessment & Plan:   See Encounters Tab for problem based charting.  Patient discussed with Dr. Dareen [name]

## 2016-04-29 NOTE — Patient Instructions (Signed)
Doris [name],  It was a pleasure to see you today. I am glad you are doing well. Your blood pressure today was high, 146/77. I have increased the dose of your amlodipine to 10 mg daily. I would like for you to stop taking simvastatin and start taking atorvastatin 40 mg instead.  I have sent a new prescriptions to your pharmacy. Please continue to take your other medications as previously prescribed. Please follow up in 1 month for blood pressure recheck. If you have any questions or concerns, call our clinic at [phone] or after hours call [phone] and ask for the internal medicine resident on call. Thank you!

## 2016-05-05 NOTE — Progress Notes (Signed)
Internal Medicine Clinic Attending  Case discussed with Dr. Guilloud at the time of the visit.  We reviewed the resident's history and exam and pertinent patient test results.  I agree with the assessment, diagnosis, and plan of care documented in the resident's note.  

## 2016-05-05 NOTE — Addendum Note (Signed)
Addended by: Aldine Contes on: 05/05/2016 08:42 PM   Modules accepted: Level of Service

## 2016-06-09 ENCOUNTER — Other Ambulatory Visit: Payer: Self-pay | Admitting: Student in an Organized Health Care Education/Training Program

## 2016-06-14 ENCOUNTER — Encounter: Payer: Medicare Other | Admitting: Internal Medicine

## 2016-06-15 DIAGNOSIS — F331 Major depressive disorder, recurrent, moderate: Secondary | ICD-10-CM | POA: Diagnosis not present

## 2016-06-15 DIAGNOSIS — F411 Generalized anxiety disorder: Secondary | ICD-10-CM | POA: Diagnosis not present

## 2016-06-24 DIAGNOSIS — M76821 Posterior tibial tendinitis, right leg: Secondary | ICD-10-CM | POA: Diagnosis not present

## 2016-06-24 DIAGNOSIS — M79671 Pain in right foot: Secondary | ICD-10-CM | POA: Diagnosis not present

## 2016-06-24 DIAGNOSIS — M722 Plantar fascial fibromatosis: Secondary | ICD-10-CM | POA: Diagnosis not present

## 2016-06-28 ENCOUNTER — Ambulatory Visit: Payer: Medicare Other | Attending: Sports Medicine | Admitting: Physical Therapy

## 2016-06-28 ENCOUNTER — Encounter: Payer: Self-pay | Admitting: Physical Therapy

## 2016-06-28 DIAGNOSIS — M25571 Pain in right ankle and joints of right foot: Secondary | ICD-10-CM | POA: Insufficient documentation

## 2016-06-28 DIAGNOSIS — M25671 Stiffness of right ankle, not elsewhere classified: Secondary | ICD-10-CM | POA: Diagnosis not present

## 2016-06-28 DIAGNOSIS — M6281 Muscle weakness (generalized): Secondary | ICD-10-CM | POA: Diagnosis not present

## 2016-06-28 DIAGNOSIS — R262 Difficulty in walking, not elsewhere classified: Secondary | ICD-10-CM | POA: Diagnosis not present

## 2016-06-28 NOTE — Therapy (Signed)
[place], [place], [address] Phone: [phone]   Fax:  [phone]  Physical Therapy Evaluation  Patient Details  Name: Doris [name] MRN: [id number] Date of Birth: [birth year] Referring Provider: Dr Vickki Hearing   Encounter Date: 06/28/2016      PT End of Session - 06/28/16 0912    Visit Number 1   Number of Visits 16   Date for PT Re-Evaluation 08/23/16   Authorization Type Medicare/ mediciad    PT Start Time 0811   PT Stop Time 0855   PT Time Calculation (min) 44 min   Activity Tolerance Patient tolerated treatment well   Behavior During Therapy [facility] for tasks assessed/performed      Past Medical History:  Diagnosis Date  . Acid reflux   . Back pain   . HLD (hyperlipidemia)   . Hypertension   . Insomnia   . Migraine   . Osteoarthritis    knee  . Psychosis    followed by Dr. Keenan Bachelor [phone]  . Thyroid nodule 2010   TSH 04/2008 = 0.296, no follow up noted in [facility]  . Vertigo     Past Surgical History:  Procedure Laterality Date  . TONSILLECTOMY    . UTERINE FIBROID SURGERY      There were no vitals filed for this visit.       Subjective Assessment - 06/28/16 0817    Subjective Patient has a 1 month history of right medial ankle and heel pain. She has had a lower leg injury in the past but this pain is new. Per patient there is some evidence of bone spuring n the heel. She had orthotics in the past but at this time she is using gel inserts.  She akso has bilateral knee pain and a budlging disc in her back.    Pertinent History buldging disc; bilateral knee OA    Limitations Standing;Walking   How long can you sit comfortably? No limit   How long can you stand comfortably? depdening on the time of the day it can be < 5-10 minutes    How long can you walk comfortably? Depdening on the time of the day any type of walking can be sore.    Diagnostic tests X-ray: per patient  bone spurring of the heel    Patient Stated Goals To have less pain    Currently in Pain? Yes   Pain Score 5    Pain Location Knee   Pain Orientation Right;Left   Pain Descriptors / Indicators Aching   Pain Type Acute pain   Pain Onset More than a month ago   Pain Frequency Constant   Aggravating Factors  ambualting and standing    Pain Relieving Factors rest, prednisone    Effect of Pain on Daily Activities difficulty walkin to perfrom ADL's             [facility] PT Assessment - 06/28/16 0001      Assessment   Medical Diagnosis Right ankle pain    Referring Provider Dr Vickki Hearing    Onset Date/Surgical Date --  1 month prior    Hand Dominance Right   Next MD Visit after PT    Prior Therapy for right arm      Precautions   Precautions Fall     Restrictions   Weight Bearing Restrictions No     Balance Screen   Has the patient fallen in the past 6 months Yes  How many times? --  frequent falls    Has the patient had a decrease in activity level because of a fear of falling?  Yes   Is the patient reluctant to leave their home because of a fear of falling?  No     Home Ecologist residence     Prior Function   Level of Independence Independent   Vocation Retired   Solicitor   Overall Cognitive Status Within Functional Limits for tasks assessed   Attention Focused   Focused Attention Appears intact   Memory Appears intact   Awareness Appears intact   Problem Solving Appears intact     Observation/Other Assessments   Observations significant flat foot      Sensation   Light Touch Appears Intact   Additional Comments denies parathesias      Coordination   Gross Motor Movements are Fluid and Coordinated Yes   Fine Motor Movements are Fluid and Coordinated Yes     ROM / Strength   AROM / PROM / Strength PROM;AROM;Strength     AROM   Overall AROM Comments left ankle Dorsiflexion somewhat limited but  WFL    AROM Assessment Site Ankle   Right/Left Ankle Right;Left   Right Ankle Dorsiflexion 0   Right Ankle Plantar Flexion 33   Right Ankle Inversion 5   Right Ankle Eversion 4     PROM   PROM Assessment Site Ankle   Right/Left Ankle Right;Left   Right Ankle Dorsiflexion 0   Right Ankle Plantar Flexion 30   Right Ankle Inversion 8   Right Ankle Eversion 4     Strength   Strength Assessment Site Ankle;Hip;Knee   Right/Left Hip Right;Left   Right Hip Flexion 4/5   Right Hip ABduction 4/5   Right Hip ADduction 4/5   Left Hip Flexion 3+/5   Left Hip ABduction 4/5   Left Hip ADduction 4/5   Right/Left Knee Right;Left   Right Knee Flexion 5/5   Right Knee Extension 5/5   Left Knee Flexion 5/5   Left Knee Extension 5/5   Right/Left Ankle Right;Left   Right Ankle Dorsiflexion 3+/5   Right Ankle Plantar Flexion --  not tested 2nd to pain    Right Ankle Inversion 3/5   Right Ankle Eversion 3+/5   Left Ankle Dorsiflexion 4+/5   Left Ankle Inversion 4+/5   Left Ankle Eversion 4+/5     Palpation   Palpation comment mild tenderenss to palpation around the lateral maleolus      Special Tests    Special Tests --  anterior drawer      Ambulation/Gait   Gait Comments decreased single leg stance on the right side; significant rear foot valgus bilateral with gait.      High Level Balance   High Level Balance Comments tandem stance min/ mod support ; Min support                    [facility] Adult PT Treatment/Exercise - 06/28/16 0001      High Level Balance   High Level Balance Comments Narrow base of support: min a for balance; tanfem stance Min/ mod suupport      Exercises   Exercises Ankle     Ankle Exercises: Seated   Other Seated Ankle Exercises DF stretch with strap 3x30 sec hold      Ankle Exercises: Supine   T-Band ev without band  2x10; DF 2x10 yellow band PF 2x10 yellow                 PT Education - 06/28/16 0911    Education provided Yes    Education Details HEP, symptom mangement; improtance of stretching and strengthening    Person(s) Educated Patient   Methods Explanation;Demonstration;Verbal cues;Tactile cues   Comprehension Verbalized understanding;Returned demonstration;Verbal cues required;Tactile cues required          PT Short Term Goals - 06/28/16 0926      PT SHORT TERM GOAL #1   Title Patient will increase passive right ankel dorsi flexion to 8 degrees    Time 4   Period Weeks   Status New     PT SHORT TERM GOAL #2   Title Patient will demsotrate 5/5 gross right    Time 4   Period Weeks   Status New     PT SHORT TERM GOAL #3   Title Patient will be independent with initial HEP for stretching and strengthening    Time 4   Period Weeks   Status New           PT Long Term Goals - 06/28/16 0932      PT LONG TERM GOAL #1   Title Patient will ambualte 3000' without significant increase in pain    Time 8   Period Weeks   Status New     PT LONG TERM GOAL #2   Title Patient will stand for 30 minutes without report of increased right ankle pain    Time 8   Period Weeks   Status New     PT LONG TERM GOAL #3   Title Patient will demonstrete a 50% limitation on FOTO    Time 8   Period Weeks   Status New               Plan - 06/28/16 0915    Clinical Impression Statement Patient is a 71 year old female with right medial ankle and heel pain. Her pain increases with ambualtion. She has significant flat foot. she had an orthotic in the past. Therapy encouraged her to wear more supportive shoes. She has ankle weakness and limited ankle dorsi flexion. She has increased pain with ambualtion. She would benefit from skilled therapy to increase strength and stability. She was seen for a low complexity evaluation.    Rehab Potential Good   PT Frequency 2x / week   PT Duration 8 weeks   PT Treatment/Interventions ADLs/Self Care Home Management;Electrical Stimulation;Cryotherapy;Iontophoresis 4mg /ml  Dexamethasone;Gait training;Moist Heat;Therapeutic activities;Therapeutic exercise;Balance training;Neuromuscular re-education;Patient/family education;Passive range of motion;Manual techniques;Splinting;Dry needling;Taping   PT Next Visit Plan cosider Ionto and or ultrasound depedning on pain. Patient will be ending predinisone this week. Continue with strengthening activity. Consider seated heel raise and posterior tib strengthening; Continue with manual stretching;    PT Home Exercise Plan ankle eversion; yellow band DF/ PF; ankle DF stretch    Consulted and Agree with Plan of Care Patient      Patient will benefit from skilled therapeutic intervention in order to improve the following deficits and impairments:  Abnormal gait, Decreased activity tolerance, Decreased range of motion, Decreased safety awareness, Increased edema, Decreased strength, Impaired UE functional use, Difficulty walking, Pain  Visit Diagnosis: Pain in right ankle and joints of right foot - Plan: PT plan of care cert/re-cert  Stiffness of right ankle, not elsewhere classified - Plan: PT plan of care cert/re-cert  Difficulty in walking,  not elsewhere classified - Plan: PT plan of care cert/re-cert  Muscle weakness (generalized) - Plan: PT plan of care cert/re-cert      G-Codes - 34/14/43 6016    Functional Assessment Tool Used (Outpatient Only) FOTO, clinical decision making    Functional Limitation Mobility: Walking and moving around   Mobility: Walking and Moving Around Current Status (D8006) At least 60 percent but less than 80 percent impaired, limited or restricted   Mobility: Walking and Moving Around Goal Status [phone]) At least 40 percent but less than 60 percent impaired, limited or restricted       Problem List Patient Active Problem List   Diagnosis Date Noted  . Osteoporosis 10/13/2015  . Acrochordon 07/07/2015  . Jaw pain 12/30/2014  . Coronary artery calcification 12/23/2014  . Tachycardia  08/27/2014  . Cholelithiasis 05/17/2014  . GERD (gastroesophageal reflux disease) 01/02/2013  . TB lung, latent 05/01/2012  . Osteoarthritis 03/01/2012  . Severe major depression with psychotic features ([place]) 04/01/2011  . Hypothyroidism (acquired) 03/22/2011  . Healthcare maintenance 12/28/2010  . BPPV (benign paroxysmal positional vertigo) 11/19/2008  . Hyperlipidemia 11/30/2005  . Essential hypertension 11/30/2005  . LOW BACK PAIN 11/30/2005    Carney Living PT DPT  06/28/2016, 11:00 AM  [facility] [address] [place], [place], [address] Phone: [phone]   Fax:  [phone]  Name: Jackelynn Hosie MRN: [id number] Date of Birth: [birth year]

## 2016-07-01 ENCOUNTER — Ambulatory Visit: Payer: Medicare Other | Admitting: Physical Therapy

## 2016-07-01 ENCOUNTER — Encounter: Payer: Self-pay | Admitting: Physical Therapy

## 2016-07-01 DIAGNOSIS — M6281 Muscle weakness (generalized): Secondary | ICD-10-CM | POA: Diagnosis not present

## 2016-07-01 DIAGNOSIS — M25571 Pain in right ankle and joints of right foot: Secondary | ICD-10-CM | POA: Diagnosis not present

## 2016-07-01 DIAGNOSIS — M25671 Stiffness of right ankle, not elsewhere classified: Secondary | ICD-10-CM

## 2016-07-01 DIAGNOSIS — R262 Difficulty in walking, not elsewhere classified: Secondary | ICD-10-CM

## 2016-07-01 NOTE — Therapy (Signed)
[place], [place], [address] Phone: [phone]   Fax:  [phone]  Physical Therapy Treatment  Patient Details  Name: Doris [name] MRN: [id number] Date of Birth: September 10, 1945 Referring Provider: Dr Vickki Hearing   Encounter Date: 07/01/2016      PT End of Session - 07/01/16 1443    Visit Number 2   Number of Visits 16   Date for PT Re-Evaluation 08/23/16   Authorization Type Medicare/ mediciad    PT Start Time 1015   PT Stop Time 1056   PT Time Calculation (min) 41 min   Activity Tolerance Patient tolerated treatment well   Behavior During Therapy [facility] for tasks assessed/performed      Past Medical History:  Diagnosis Date  . Acid reflux   . Back pain   . HLD (hyperlipidemia)   . Hypertension   . Insomnia   . Migraine   . Osteoarthritis    knee  . Psychosis    followed by Dr. Keenan Bachelor [phone]  . Thyroid nodule 2010   TSH 04/2008 = 0.296, no follow up noted in [facility]  . Vertigo     Past Surgical History:  Procedure Laterality Date  . TONSILLECTOMY    . UTERINE FIBROID SURGERY      There were no vitals filed for this visit.      Subjective Assessment - 07/01/16 1023    Subjective (P)  Patient reported that she was up on her ankle a lot yesterday and it was sore. Today the pain is better.    Pertinent History (P)  buldging disc; bilateral knee OA    Limitations (P)  Standing;Walking   How long can you sit comfortably? (P)  No limit   How long can you stand comfortably? (P)  depdening on the time of the day it can be < 5-10 minutes    How long can you walk comfortably? (P)  Depdening on the time of the day any type of walking can be sore.    Diagnostic tests (P)  X-ray: per patient bone spurring of the heel    Patient Stated Goals (P)  To have less pain                          [facility] Adult PT Treatment/Exercise - 07/01/16 0001      Modalities   Modalities  Iontophoresis;Ultrasound     Ultrasound   Ultrasound Location right medial achillies/ posterior tib tendon area    Ultrasound Parameters 1.5 w/cm2 3.3 mhz continuous    Ultrasound Goals Pain     Iontophoresis   Type of Iontophoresis Dexamethasone   Location medial calcanues and medial achillies area    Dose 1 cc    Time slow release patch      Manual Therapy   Manual therapy comments gentle PROM into all palnes; Soft tissue mobilization to the posterior heel; Manual Gastroc stretch; Transverse friction massage to medial achilies.      Ankle Exercises: Seated   Other Seated Ankle Exercises DF stretch with strap 3x30 sec hold; seated windshield wiper 2x10;      Ankle Exercises: Supine   T-Band ev without band 2x10; DF 2x10 yellow band PF 2x10 yellow                 PT Education - 07/01/16 1442    Education provided Yes   Education Details rationale behind Iontophoresis  Person(s) Educated Patient   Methods Explanation;Demonstration;Tactile cues;Verbal cues   Comprehension Verbalized understanding;Returned demonstration;Verbal cues required;Tactile cues required          PT Short Term Goals - 06/28/16 0926      PT SHORT TERM GOAL #1   Title Patient will increase passive right ankel dorsi flexion to 8 degrees    Time 4   Period Weeks   Status New     PT SHORT TERM GOAL #2   Title Patient will demsotrate 5/5 gross right    Time 4   Period Weeks   Status New     PT SHORT TERM GOAL #3   Title Patient will be independent with initial HEP for stretching and strengthening    Time 4   Period Weeks   Status New           PT Long Term Goals - 06/28/16 0932      PT LONG TERM GOAL #1   Title Patient will ambualte 3000' without significant increase in pain    Time 8   Period Weeks   Status New     PT LONG TERM GOAL #2   Title Patient will stand for 30 minutes without report of increased right ankle pain    Time 8   Period Weeks   Status New     PT  LONG TERM GOAL #3   Title Patient will demonstrete a 50% limitation on FOTO    Time 8   Period Weeks   Status New               Plan - 07/01/16 1447    Clinical Impression Statement Patient tolerated treamtnet well. She was able to perfrom ther-ex without pain. She was advised to conitnue with her stretching and ther-ex at home, Therapy added ionto and ultrasound. Continue to progress as tolerated.    Rehab Potential Good   PT Frequency 2x / week   PT Duration 8 weeks   PT Treatment/Interventions ADLs/Self Care Home Management;Electrical Stimulation;Cryotherapy;Iontophoresis 4mg /ml Dexamethasone;Gait training;Moist Heat;Therapeutic activities;Therapeutic exercise;Balance training;Neuromuscular re-education;Patient/family education;Passive range of motion;Manual techniques;Splinting;Dry needling;Taping   PT Next Visit Plan cosider Ionto and or ultrasound depedning on pain. Patient will be ending predinisone this week. Continue with strengthening activity. Consider seated heel raise and posterior tib strengthening; Continue with manual stretching;    PT Home Exercise Plan ankle eversion; yellow band DF/ PF; ankle DF stretch    Consulted and Agree with Plan of Care Patient      Patient will benefit from skilled therapeutic intervention in order to improve the following deficits and impairments:  Abnormal gait, Decreased activity tolerance, Decreased range of motion, Decreased safety awareness, Increased edema, Decreased strength, Impaired UE functional use, Difficulty walking, Pain  Visit Diagnosis: Pain in right ankle and joints of right foot  Stiffness of right ankle, not elsewhere classified  Difficulty in walking, not elsewhere classified  Muscle weakness (generalized)     Problem List Patient Active Problem List   Diagnosis Date Noted  . Osteoporosis 10/13/2015  . Acrochordon 07/07/2015  . Jaw pain 12/30/2014  . Coronary artery calcification 12/23/2014  . Tachycardia  08/27/2014  . Cholelithiasis 05/17/2014  . GERD (gastroesophageal reflux disease) 01/02/2013  . TB lung, latent 05/01/2012  . Osteoarthritis 03/01/2012  . Severe major depression with psychotic features ([place]) 04/01/2011  . Hypothyroidism (acquired) 03/22/2011  . Healthcare maintenance 12/28/2010  . BPPV (benign paroxysmal positional vertigo) 11/19/2008  . Hyperlipidemia 11/30/2005  . Essential hypertension 11/30/2005  .  LOW BACK PAIN 11/30/2005    Carney Living PT DPT  07/01/2016, 2:49 PM  [facility] [address] [place], [place], [address] Phone: [phone]   Fax:  [phone]  Name: Doris [name] MRN: [id number] Date of Birth: [birth year]

## 2016-07-07 ENCOUNTER — Ambulatory Visit: Payer: Medicare Other | Admitting: Physical Therapy

## 2016-07-07 DIAGNOSIS — M6281 Muscle weakness (generalized): Secondary | ICD-10-CM | POA: Diagnosis not present

## 2016-07-07 DIAGNOSIS — R262 Difficulty in walking, not elsewhere classified: Secondary | ICD-10-CM | POA: Diagnosis not present

## 2016-07-07 DIAGNOSIS — M25671 Stiffness of right ankle, not elsewhere classified: Secondary | ICD-10-CM

## 2016-07-07 DIAGNOSIS — M25571 Pain in right ankle and joints of right foot: Secondary | ICD-10-CM | POA: Diagnosis not present

## 2016-07-07 NOTE — Therapy (Signed)
[place], [place], [address] Phone: [phone]   Fax:  [phone]  Physical Therapy Treatment  Patient Details  Name: Doris [name] MRN: [id number] Date of Birth: 29-Dec-1945 Referring Provider: Dr Vickki Hearing   Encounter Date: 07/07/2016      PT End of Session - 07/07/16 0934    Visit Number 3   Number of Visits 16   Date for PT Re-Evaluation 08/23/16   Authorization Type Medicare/ mediciad    PT Start Time 0930   PT Stop Time 1010   PT Time Calculation (min) 40 min      Past Medical History:  Diagnosis Date  . Acid reflux   . Back pain   . HLD (hyperlipidemia)   . Hypertension   . Insomnia   . Migraine   . Osteoarthritis    knee  . Psychosis    followed by Dr. Keenan Bachelor [phone]  . Thyroid nodule 2010   TSH 04/2008 = 0.296, no follow up noted in [facility]  . Vertigo     Past Surgical History:  Procedure Laterality Date  . TONSILLECTOMY    . UTERINE FIBROID SURGERY      There were no vitals filed for this visit.      Subjective Assessment - 07/07/16 0934    Subjective The pain comes and goes. I feel it when I first get up from sitting. I think last visit helped.    Currently in Pain? Yes   Pain Score 1    Pain Location Knee   Pain Orientation Right   Pain Descriptors / Indicators Aching   Aggravating Factors  when I first get up, walking    Pain Relieving Factors standing in one place                          [facility] Adult PT Treatment/Exercise - 07/07/16 0001      Ultrasound   Ultrasound Location right medial ankle and heel   Ultrasound Parameters 1.2 w/cm2 1 mhz sound head at 50%    Ultrasound Goals Pain     Iontophoresis   Type of Iontophoresis Dexamethasone   Location medial calcanues and medial achillies area    Dose 1 cc    Time slow release patch      Manual Therapy   Manual therapy comments gentle PROM into all palnes; Soft tissue  mobilization to the posterior heel; Manual Gastroc stretch; Transverse friction massage to medial achilies.      Ankle Exercises: Seated   Heel Raises 20 reps   Toe Raise 20 reps     Ankle Exercises: Supine   T-Band 4 way ankle yellow band, no pain      Ankle Exercises: Stretches   Gastroc Stretch --  passive                   PT Short Term Goals - 06/28/16 0926      PT SHORT TERM GOAL #1   Title Patient will increase passive right ankel dorsi flexion to 8 degrees    Time 4   Period Weeks   Status New     PT SHORT TERM GOAL #2   Title Patient will demsotrate 5/5 gross right    Time 4   Period Weeks   Status New     PT SHORT TERM GOAL #3   Title Patient will be independent with initial HEP for stretching and strengthening  Time 4   Period Weeks   Status New           PT Long Term Goals - 06/28/16 0932      PT LONG TERM GOAL #1   Title Patient will ambualte 3000' without significant increase in pain    Time 8   Period Weeks   Status New     PT LONG TERM GOAL #2   Title Patient will stand for 30 minutes without report of increased right ankle pain    Time 8   Period Weeks   Status New     PT LONG TERM GOAL #3   Title Patient will demonstrete a 50% limitation on FOTO    Time 8   Period Weeks   Status New               Plan - 07/07/16 1034    Clinical Impression Statement Pt felt ionto and [place] were helpful. She was reluctant to remove socks for treatment today. Repeated modalities and performed soft tissue work to medial calf. Pt reported no tenderness however several areas of spasms noted.    PT Next Visit Plan pt is interested in inner ear treatment? cosider Ionto and or ultrasound depedning on pain. Patient will be ending predinisone this week. Continue with strengthening activity. Consider seated heel raise and posterior tib strengthening; Continue with manual stretching;    PT Home Exercise Plan ankle eversion; yellow band DF/ PF;  ankle DF stretch    Consulted and Agree with Plan of Care Patient      Patient will benefit from skilled therapeutic intervention in order to improve the following deficits and impairments:  Abnormal gait, Decreased activity tolerance, Decreased range of motion, Decreased safety awareness, Increased edema, Decreased strength, Impaired UE functional use, Difficulty walking, Pain  Visit Diagnosis: Pain in right ankle and joints of right foot  Stiffness of right ankle, not elsewhere classified  Difficulty in walking, not elsewhere classified  Muscle weakness (generalized)     Problem List Patient Active Problem List   Diagnosis Date Noted  . Osteoporosis 10/13/2015  . Acrochordon 07/07/2015  . Jaw pain 12/30/2014  . Coronary artery calcification 12/23/2014  . Tachycardia 08/27/2014  . Cholelithiasis 05/17/2014  . GERD (gastroesophageal reflux disease) 01/02/2013  . TB lung, latent 05/01/2012  . Osteoarthritis 03/01/2012  . Severe major depression with psychotic features ([place]) 04/01/2011  . Hypothyroidism (acquired) 03/22/2011  . Healthcare maintenance 12/28/2010  . BPPV (benign paroxysmal positional vertigo) 11/19/2008  . Hyperlipidemia 11/30/2005  . Essential hypertension 11/30/2005  . LOW BACK PAIN 11/30/2005    Dorene Ar, PTA 07/07/2016, 11:18 AM  [facility] [address] [place], [place], [address] Phone: [phone]   Fax:  [phone]  Name: Sylva Overley MRN: [id number] Date of Birth: [birth year]

## 2016-07-09 ENCOUNTER — Ambulatory Visit: Payer: Medicare Other | Admitting: Physical Therapy

## 2016-07-09 ENCOUNTER — Encounter: Payer: Self-pay | Admitting: Physical Therapy

## 2016-07-09 DIAGNOSIS — M6281 Muscle weakness (generalized): Secondary | ICD-10-CM

## 2016-07-09 DIAGNOSIS — M25671 Stiffness of right ankle, not elsewhere classified: Secondary | ICD-10-CM

## 2016-07-09 DIAGNOSIS — M25571 Pain in right ankle and joints of right foot: Secondary | ICD-10-CM

## 2016-07-09 DIAGNOSIS — R262 Difficulty in walking, not elsewhere classified: Secondary | ICD-10-CM

## 2016-07-09 NOTE — Therapy (Signed)
[place], [place], [address] Phone: [phone]   Fax:  [phone]  Physical Therapy Treatment  Patient Details  Name: Doris [name] MRN: [id number] Date of Birth: Nov 15, 1945 Referring Provider: Dr Vickki Hearing   Encounter Date: 07/09/2016      PT End of Session - 07/09/16 1226    Visit Number 4   Number of Visits 16   Date for PT Re-Evaluation 08/23/16   Authorization Type Medicare/ mediciad    PT Start Time 1015   PT Stop Time 1102   PT Time Calculation (min) 47 min   Activity Tolerance Patient tolerated treatment well   Behavior During Therapy [facility] for tasks assessed/performed      Past Medical History:  Diagnosis Date  . Acid reflux   . Back pain   . HLD (hyperlipidemia)   . Hypertension   . Insomnia   . Migraine   . Osteoarthritis    knee  . Psychosis    followed by Dr. Keenan Bachelor [phone]  . Thyroid nodule 2010   TSH 04/2008 = 0.296, no follow up noted in [facility]  . Vertigo     Past Surgical History:  Procedure Laterality Date  . TONSILLECTOMY    . UTERINE FIBROID SURGERY      There were no vitals filed for this visit.      Subjective Assessment - 07/09/16 1221    Subjective Patient feelt sore yesterday. She reports she did a workout with a lot of squats and running in place. She otherwise felt like it was improving. She reports she has been walking on her tow. she was advised not too.    Pertinent History buldging disc; bilateral knee OA    Limitations Standing;Walking   How long can you sit comfortably? No limit   How long can you stand comfortably? depdening on the time of the day it can be < 5-10 minutes    How long can you walk comfortably? Depdening on the time of the day any type of walking can be sore.    Diagnostic tests X-ray: per patient bone spurring of the heel    Currently in Pain? Yes   Pain Score 2    Pain Location Knee   Pain Orientation Right   Pain  Descriptors / Indicators Aching   Pain Type Acute pain   Pain Onset More than a month ago   Pain Frequency Constant   Aggravating Factors  when I stand and walk    Pain Relieving Factors standing in one place    Effect of Pain on Daily Activities difficulty walking to perfrom ADL's             [facility] PT Assessment - 07/09/16 0001      PROM   Right Ankle Dorsiflexion 5   Right Ankle Inversion 20   Right Ankle Eversion 10                               PT Short Term Goals - 06/28/16 0926      PT SHORT TERM GOAL #1   Title Patient will increase passive right ankel dorsi flexion to 8 degrees    Time 4   Period Weeks   Status New     PT SHORT TERM GOAL #2   Title Patient will demsotrate 5/5 gross right    Time 4   Period Weeks   Status New  PT SHORT TERM GOAL #3   Title Patient will be independent with initial HEP for stretching and strengthening    Time 4   Period Weeks   Status New           PT Long Term Goals - 06/28/16 0932      PT LONG TERM GOAL #1   Title Patient will ambualte 3000' without significant increase in pain    Time 8   Period Weeks   Status New     PT LONG TERM GOAL #2   Title Patient will stand for 30 minutes without report of increased right ankle pain    Time 8   Period Weeks   Status New     PT LONG TERM GOAL #3   Title Patient will demonstrete a 50% limitation on FOTO    Time 8   Period Weeks   Status New               Plan - 07/09/16 1227    Clinical Impression Statement Therapy continued Ionto. Her dorsi flexion has improved. Therapy advanced her bands and reccomended she continue with her other exercises.    Rehab Potential Good   PT Frequency 2x / week   PT Duration 8 weeks   PT Treatment/Interventions ADLs/Self Care Home Management;Electrical Stimulation;Cryotherapy;Iontophoresis 4mg /ml Dexamethasone;Gait training;Moist Heat;Therapeutic activities;Therapeutic exercise;Balance  training;Neuromuscular re-education;Patient/family education;Passive range of motion;Manual techniques;Splinting;Dry needling;Taping   PT Next Visit Plan pt is interested in inner ear treatment? cosider Ionto and or ultrasound depedning on pain. Patient will be ending predinisone this week. Continue with strengthening activity. Consider seated heel raise and posterior tib strengthening; Continue with manual stretching;    PT Home Exercise Plan ankle eversion; yellow band DF/ PF; ankle DF stretch    Consulted and Agree with Plan of Care Patient      Patient will benefit from skilled therapeutic intervention in order to improve the following deficits and impairments:  Abnormal gait, Decreased activity tolerance, Decreased range of motion, Decreased safety awareness, Increased edema, Decreased strength, Impaired UE functional use, Difficulty walking, Pain  Visit Diagnosis: Pain in right ankle and joints of right foot  Stiffness of right ankle, not elsewhere classified  Difficulty in walking, not elsewhere classified  Muscle weakness (generalized)     Problem List Patient Active Problem List   Diagnosis Date Noted  . Osteoporosis 10/13/2015  . Acrochordon 07/07/2015  . Jaw pain 12/30/2014  . Coronary artery calcification 12/23/2014  . Tachycardia 08/27/2014  . Cholelithiasis 05/17/2014  . GERD (gastroesophageal reflux disease) 01/02/2013  . TB lung, latent 05/01/2012  . Osteoarthritis 03/01/2012  . Severe major depression with psychotic features ([place]) 04/01/2011  . Hypothyroidism (acquired) 03/22/2011  . Healthcare maintenance 12/28/2010  . BPPV (benign paroxysmal positional vertigo) 11/19/2008  . Hyperlipidemia 11/30/2005  . Essential hypertension 11/30/2005  . LOW BACK PAIN 11/30/2005    Carney Living  PT DPT  07/09/2016, 12:30 PM  [place], [place], [address] Phone: [phone]   Fax:   [phone]  Name: Hallel Denherder MRN: [id number] Date of Birth: 1945/10/06

## 2016-07-13 ENCOUNTER — Encounter: Payer: Self-pay | Admitting: Physical Therapy

## 2016-07-13 ENCOUNTER — Ambulatory Visit: Payer: Medicare Other | Admitting: Physical Therapy

## 2016-07-13 DIAGNOSIS — M6281 Muscle weakness (generalized): Secondary | ICD-10-CM | POA: Diagnosis not present

## 2016-07-13 DIAGNOSIS — R262 Difficulty in walking, not elsewhere classified: Secondary | ICD-10-CM

## 2016-07-13 DIAGNOSIS — M25571 Pain in right ankle and joints of right foot: Secondary | ICD-10-CM

## 2016-07-13 DIAGNOSIS — M25671 Stiffness of right ankle, not elsewhere classified: Secondary | ICD-10-CM | POA: Diagnosis not present

## 2016-07-14 ENCOUNTER — Encounter: Payer: Self-pay | Admitting: Physical Therapy

## 2016-07-14 NOTE — Therapy (Signed)
[place], [place], [address] Phone: [phone]   Fax:  [phone]  Physical Therapy Treatment  Patient Details  Name: Doris [name] MRN: [id number] Date of Birth: [birth year] Referring Provider: Dr Vickki Hearing   Encounter Date: 07/13/2016      PT End of Session - 07/13/16 1112    Visit Number 5   Number of Visits 16   Date for PT Re-Evaluation 08/23/16   Authorization Type Medicare/ mediciad    PT Start Time 1015   PT Stop Time 1058   PT Time Calculation (min) 43 min   Activity Tolerance Patient tolerated treatment well   Behavior During Therapy [facility] for tasks assessed/performed      Past Medical History:  Diagnosis Date  . Acid reflux   . Back pain   . HLD (hyperlipidemia)   . Hypertension   . Insomnia   . Migraine   . Osteoarthritis    knee  . Psychosis    followed by Dr. Keenan Bachelor [phone]  . Thyroid nodule 2010   TSH 04/2008 = 0.296, no follow up noted in [facility]  . Vertigo     Past Surgical History:  Procedure Laterality Date  . TONSILLECTOMY    . UTERINE FIBROID SURGERY      There were no vitals filed for this visit.      Subjective Assessment - 07/13/16 1024    Subjective Patient reports she was able to do some walking this weekend. She reports it is not hurting at all today.    Pertinent History buldging disc; bilateral knee OA    Limitations Standing;Walking   How long can you sit comfortably? No limit   How long can you stand comfortably? depdening on the time of the day it can be < 5-10 minutes    How long can you walk comfortably? Depdening on the time of the day any type of walking can be sore.    Currently in Pain? No/denies                         [facility] Adult PT Treatment/Exercise - 07/14/16 0001      Iontophoresis   Type of Iontophoresis Dexamethasone   Location medial calcanues and medial achillies area    Dose 1 cc    Time slow  release patch      Manual Therapy   Manual therapy comments gentle PROM into all palnes; Soft tissue mobilization to the posterior heel; Manual Gastroc stretch; Transverse friction massage to medial achilies.      Ankle Exercises: Standing   Heel Raises --  2x10   Other Standing Ankle Exercises marching 2x10;    Other Standing Ankle Exercises mini squats. Patient is doing the,m at her workout sessions. Patient advised they may cause some knee pain but so far she hasnt noticed them irritating her knee.      Ankle Exercises: Supine   T-Band 4 way ankle yellow band, no pain                 PT Education - 07/13/16 1112    Education provided Yes   Education Details improtance of standing strengthening    Person(s) Educated Patient   Methods Explanation;Demonstration;Tactile cues;Verbal cues   Comprehension Verbalized understanding;Returned demonstration;Verbal cues required;Tactile cues required          PT Short Term Goals - 06/28/16 0926      PT SHORT TERM GOAL #1  Title Patient will increase passive right ankel dorsi flexion to 8 degrees    Time 4   Period Weeks   Status New     PT SHORT TERM GOAL #2   Title Patient will demsotrate 5/5 gross right    Time 4   Period Weeks   Status New     PT SHORT TERM GOAL #3   Title Patient will be independent with initial HEP for stretching and strengthening    Time 4   Period Weeks   Status New           PT Long Term Goals - 06/28/16 0932      PT LONG TERM GOAL #1   Title Patient will ambualte 3000' without significant increase in pain    Time 8   Period Weeks   Status New     PT LONG TERM GOAL #2   Title Patient will stand for 30 minutes without report of increased right ankle pain    Time 8   Period Weeks   Status New     PT LONG TERM GOAL #3   Title Patient will demonstrete a 50% limitation on FOTO    Time 8   Period Weeks   Status New               Plan - 07/14/16 3893    Clinical  Impression Statement Patient tolerated treatment well. Therapy added light standing exercises. she reported she could feel the area after soft tissue work but oit still was not hurting. Therapy will continue Ionto for 1-2 more visits. She has 2 isits schedueled next week. Therapy will assess progress next week.    Clinical Presentation Stable   Clinical Decision Making Low   Rehab Potential Good   PT Frequency 2x / week   PT Duration 8 weeks   PT Treatment/Interventions ADLs/Self Care Home Management;Electrical Stimulation;Cryotherapy;Iontophoresis 4mg /ml Dexamethasone;Gait training;Moist Heat;Therapeutic activities;Therapeutic exercise;Balance training;Neuromuscular re-education;Patient/family education;Passive range of motion;Manual techniques;Splinting;Dry needling;Taping   PT Next Visit Plan pt is interested in inner ear treatment? cosider Ionto and or ultrasound depedning on pain. Patient will be ending predinisone this week. Continue with strengthening activity. Consider seated heel raise and posterior tib strengthening; Continue with manual stretching;    PT Home Exercise Plan ankle eversion; yellow band DF/ PF; ankle DF stretch    Consulted and Agree with Plan of Care Patient      Patient will benefit from skilled therapeutic intervention in order to improve the following deficits and impairments:  Abnormal gait, Decreased activity tolerance, Decreased range of motion, Decreased safety awareness, Increased edema, Decreased strength, Impaired UE functional use, Difficulty walking, Pain  Visit Diagnosis: Pain in right ankle and joints of right foot  Stiffness of right ankle, not elsewhere classified  Difficulty in walking, not elsewhere classified  Muscle weakness (generalized)     Problem List Patient Active Problem List   Diagnosis Date Noted  . Osteoporosis 10/13/2015  . Acrochordon 07/07/2015  . Jaw pain 12/30/2014  . Coronary artery calcification 12/23/2014  . Tachycardia  08/27/2014  . Cholelithiasis 05/17/2014  . GERD (gastroesophageal reflux disease) 01/02/2013  . TB lung, latent 05/01/2012  . Osteoarthritis 03/01/2012  . Severe major depression with psychotic features ([place]) 04/01/2011  . Hypothyroidism (acquired) 03/22/2011  . Healthcare maintenance 12/28/2010  . BPPV (benign paroxysmal positional vertigo) 11/19/2008  . Hyperlipidemia 11/30/2005  . Essential hypertension 11/30/2005  . LOW BACK PAIN 11/30/2005    Carney Living PT DPT  07/14/2016,  7:50 AM  [place], [place], [address] Phone: [phone]   Fax:  [phone]  Name: Doris [name] MRN: [id number] Date of Birth: 1945-10-06

## 2016-07-15 ENCOUNTER — Ambulatory Visit: Payer: Medicare Other | Admitting: Physical Therapy

## 2016-07-15 ENCOUNTER — Encounter: Payer: Self-pay | Admitting: Physical Therapy

## 2016-07-15 DIAGNOSIS — M25571 Pain in right ankle and joints of right foot: Secondary | ICD-10-CM | POA: Diagnosis not present

## 2016-07-15 DIAGNOSIS — M6281 Muscle weakness (generalized): Secondary | ICD-10-CM

## 2016-07-15 DIAGNOSIS — M25671 Stiffness of right ankle, not elsewhere classified: Secondary | ICD-10-CM

## 2016-07-15 DIAGNOSIS — R262 Difficulty in walking, not elsewhere classified: Secondary | ICD-10-CM

## 2016-07-15 NOTE — Therapy (Signed)
[place], [place], [address] Phone: [phone]   Fax:  [phone]  Physical Therapy Treatment  Patient Details  Name: Doris [name] MRN: [id number] Date of Birth: 09-24-1945 Referring Provider: Dr Vickki Hearing   Encounter Date: 07/15/2016      PT End of Session - 07/15/16 1652    Visit Number 6   Number of Visits 16   Date for PT Re-Evaluation 08/23/16   Authorization Type Medicare/ mediciad    PT Start Time 1103   PT Stop Time 1147   PT Time Calculation (min) 44 min   Activity Tolerance Patient tolerated treatment well   Behavior During Therapy [facility] for tasks assessed/performed      Past Medical History:  Diagnosis Date  . Acid reflux   . Back pain   . HLD (hyperlipidemia)   . Hypertension   . Insomnia   . Migraine   . Osteoarthritis    knee  . Psychosis    followed by Dr. Keenan Bachelor [phone]  . Thyroid nodule 2010   TSH 04/2008 = 0.296, no follow up noted in [facility]  . Vertigo     Past Surgical History:  Procedure Laterality Date  . TONSILLECTOMY    . UTERINE FIBROID SURGERY      There were no vitals filed for this visit.      Subjective Assessment - 07/15/16 1158    Subjective Patients C/O is pain in her lateral thigh when she is sleeping at night. She is having difficulty sleeping. She wakes up several times a night. She feels stiff today but her pain is under control.    Pertinent History buldging disc; bilateral knee OA    Limitations Standing;Walking   How long can you sit comfortably? No limit   How long can you stand comfortably? depdening on the time of the day it can be < 5-10 minutes    How long can you walk comfortably? Depdening on the time of the day any type of walking can be sore.    Diagnostic tests X-ray: per patient bone spurring of the heel    Patient Stated Goals To have less pain    Currently in Pain? Yes   Pain Score 6                           [facility] Adult PT Treatment/Exercise - 07/15/16 0001      Iontophoresis   Type of Iontophoresis Dexamethasone   Location medial calcanues and medial achillies area    Dose 1 cc    Time slow release patch      Manual Therapy   Manual therapy comments gentle PROM into all palnes; Soft tissue mobilization to the posterior heel; Manual Gastroc stretch; Transverse friction massage to medial achilies.      Ankle Exercises: Supine   T-Band 4 way ankle red 2x10      Ankle Exercises: Seated   Heel Raises 20 reps   Toe Raise 20 reps     Ankle Exercises: Stretches   Gastroc Stretch 30 seconds;2 reps                PT Education - 07/15/16 1234    Education provided Yes   Education Details continue with strengthening and stretching as tolerated.    Person(s) Educated Patient   Methods Explanation;Demonstration;Tactile cues;Verbal cues   Comprehension Verbalized understanding;Returned demonstration;Verbal cues required;Tactile cues required  PT Short Term Goals - 06/28/16 0926      PT SHORT TERM GOAL #1   Title Patient will increase passive right ankel dorsi flexion to 8 degrees    Time 4   Period Weeks   Status New     PT SHORT TERM GOAL #2   Title Patient will demsotrate 5/5 gross right    Time 4   Period Weeks   Status New     PT SHORT TERM GOAL #3   Title Patient will be independent with initial HEP for stretching and strengthening    Time 4   Period Weeks   Status New           PT Long Term Goals - 06/28/16 0932      PT LONG TERM GOAL #1   Title Patient will ambualte 3000' without significant increase in pain    Time 8   Period Weeks   Status New     PT LONG TERM GOAL #2   Title Patient will stand for 30 minutes without report of increased right ankle pain    Time 8   Period Weeks   Status New     PT LONG TERM GOAL #3   Title Patient will demonstrete a 50% limitation on FOTO    Time 8   Period  Weeks   Status New               Plan - 07/15/16 1653    Clinical Impression Statement Patient is kmaking good progress. Therapytrialed session without ultraspund. She did not perfrom standing exercises 2nd to pain. she had no pain after treatment. The paitnet is making good progress. She will be seen 2x next week then decide if she will continue or discharge.    Clinical Presentation Stable   Clinical Decision Making Low   Rehab Potential Good   PT Frequency 2x / week   PT Duration 8 weeks   PT Treatment/Interventions ADLs/Self Care Home Management;Electrical Stimulation;Cryotherapy;Iontophoresis 4mg /ml Dexamethasone;Gait training;Moist Heat;Therapeutic activities;Therapeutic exercise;Balance training;Neuromuscular re-education;Patient/family education;Passive range of motion;Manual techniques;Splinting;Dry needling;Taping   PT Next Visit Plan pt is interested in inner ear treatment? cosider Ionto and or ultrasound depedning on pain. Patient will be ending predinisone this week. Continue with strengthening activity. Consider seated heel raise and posterior tib strengthening; Continue with manual stretching;    PT Home Exercise Plan ankle eversion; yellow band DF/ PF; ankle DF stretch    Consulted and Agree with Plan of Care Patient      Patient will benefit from skilled therapeutic intervention in order to improve the following deficits and impairments:  Abnormal gait, Decreased activity tolerance, Decreased range of motion, Decreased safety awareness, Increased edema, Decreased strength, Impaired UE functional use, Difficulty walking, Pain  Visit Diagnosis: Pain in right ankle and joints of right foot  Stiffness of right ankle, not elsewhere classified  Difficulty in walking, not elsewhere classified  Muscle weakness (generalized)     Problem List Patient Active Problem List   Diagnosis Date Noted  . Osteoporosis 10/13/2015  . Acrochordon 07/07/2015  . Jaw pain  12/30/2014  . Coronary artery calcification 12/23/2014  . Tachycardia 08/27/2014  . Cholelithiasis 05/17/2014  . GERD (gastroesophageal reflux disease) 01/02/2013  . TB lung, latent 05/01/2012  . Osteoarthritis 03/01/2012  . Severe major depression with psychotic features ([place]) 04/01/2011  . Hypothyroidism (acquired) 03/22/2011  . Healthcare maintenance 12/28/2010  . BPPV (benign paroxysmal positional vertigo) 11/19/2008  . Hyperlipidemia 11/30/2005  . Essential hypertension  11/30/2005  . LOW BACK PAIN 11/30/2005    Carney Living PT DPT  07/15/2016, 4:58 PM  [facility] [address] [place], [place], [address] Phone: [phone]   Fax:  [phone]  Name: Doris [name] MRN: [id number] Date of Birth: 01/20/1946

## 2016-07-20 ENCOUNTER — Ambulatory Visit: Payer: Medicare Other | Attending: Sports Medicine | Admitting: Physical Therapy

## 2016-07-20 ENCOUNTER — Other Ambulatory Visit: Payer: Self-pay | Admitting: *Deleted

## 2016-07-20 DIAGNOSIS — M25671 Stiffness of right ankle, not elsewhere classified: Secondary | ICD-10-CM | POA: Insufficient documentation

## 2016-07-20 DIAGNOSIS — I10 Essential (primary) hypertension: Secondary | ICD-10-CM

## 2016-07-20 DIAGNOSIS — M25571 Pain in right ankle and joints of right foot: Secondary | ICD-10-CM | POA: Diagnosis not present

## 2016-07-20 DIAGNOSIS — M6281 Muscle weakness (generalized): Secondary | ICD-10-CM

## 2016-07-20 DIAGNOSIS — R262 Difficulty in walking, not elsewhere classified: Secondary | ICD-10-CM | POA: Insufficient documentation

## 2016-07-20 MED ORDER — AMLODIPINE BESYLATE 10 MG PO TABS: 10.0000 mg | ORAL_TABLET | Freq: Every day | ORAL | 2 refills | Status: DC

## 2016-07-20 NOTE — Therapy (Signed)
[place], [place], [address] Phone: [phone]   Fax:  [phone]  Physical Therapy Treatment  Patient Details  Name: Doris [name] MRN: [id number] Date of Birth: [birth year] Referring Provider: Dr Vickki Hearing   Encounter Date: 07/20/2016      PT End of Session - 07/20/16 1022    Visit Number 7   Number of Visits 16   Date for PT Re-Evaluation 08/23/16   Authorization Type Medicare/ mediciad    PT Start Time 1016   PT Stop Time 1100   PT Time Calculation (min) 44 min      Past Medical History:  Diagnosis Date  . Acid reflux   . Back pain   . HLD (hyperlipidemia)   . Hypertension   . Insomnia   . Migraine   . Osteoarthritis    knee  . Psychosis    followed by Dr. Keenan Bachelor [phone]  . Thyroid nodule 2010   TSH 04/2008 = 0.296, no follow up noted in [facility]  . Vertigo     Past Surgical History:  Procedure Laterality Date  . TONSILLECTOMY    . UTERINE FIBROID SURGERY      There were no vitals filed for this visit.      Subjective Assessment - 07/20/16 1020    Subjective Stayed on my feet for 4 hours.    Currently in Pain? Yes   Pain Score 2    Pain Location Heel   Pain Descriptors / Indicators Discomfort            [facility] PT Assessment - 07/20/16 0001      PROM   Right Ankle Dorsiflexion 6   Right Ankle Inversion 24   Right Ankle Eversion 18     Strength   Right Ankle Dorsiflexion 4+/5   Right Ankle Inversion 4+/5   Right Ankle Eversion 4+/5                     [facility] Adult PT Treatment/Exercise - 07/20/16 0001      Iontophoresis   Type of Iontophoresis Dexamethasone   Location medial calcanues and medial achillies area    Dose 1 cc    Time slow release patch      Ankle Exercises: Standing   SLS 3 sec best Rt, 5 sec best Lt    Heel Raises 10 reps  uncomfortable    Toe Raise --  alternating toe raisesx 20   Other Standing Ankle Exercises  tandem stance 10 sec best, tandem gait in parallel bars, needs UE assist.      Ankle Exercises: Stretches   Gastroc Stretch 30 seconds;2 reps                  PT Short Term Goals - 07/20/16 1038      PT SHORT TERM GOAL #1   Title Patient will increase passive right ankel dorsi flexion to 8 degrees    Baseline 6   Time 4   Period Weeks   Status On-going     PT SHORT TERM GOAL #2   Title Patient will demsotrate 5/5 gross right    Baseline 4+/5, DF, INV, EV   Time 4   Period Weeks   Status On-going     PT SHORT TERM GOAL #3   Title Patient will be independent with initial HEP for stretching and strengthening    Time 4   Period Weeks   Status Achieved  PT Long Term Goals - 06/28/16 0932      PT LONG TERM GOAL #1   Title Patient will ambualte 3000' without significant increase in pain    Time 8   Period Weeks   Status New     PT LONG TERM GOAL #2   Title Patient will stand for 30 minutes without report of increased right ankle pain    Time 8   Period Weeks   Status New     PT LONG TERM GOAL #3   Title Patient will demonstrete a 50% limitation on FOTO    Time 8   Period Weeks   Status New               Plan - 07/20/16 1103    Clinical Impression Statement Pt reports discomfort today. 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 She reports pain in medial ankle is mostly resolved and her pain is mostly in her heel. Her strength and ROM of ankle have improved. She is unable to perform single heel raise on left or Right leg. She reports discomfort with bilateral heel raises. Asked her to try a few heel raises at home but to stop with pain. SLS 3-5 sec best bilateral. SHe is independent with HEP. SHe performs her HEP often. STG# 3 met.    PT Next Visit Plan DISCUSS CONTIUNE vs HOLD- has MD appt soon. pt is interested in inner ear treatment? cosider Ionto and or ultrasound depedning on pain. Patient  will be ending predinisone this week. Continue with strengthening activity. Consider seated heel raise and posterior tib strengthening; Continue with manual stretching;    PT Home Exercise Plan ankle eversion; yellow band DF/ PF; ankle DF stretch    Consulted and Agree with Plan of Care Patient      Patient will benefit from skilled therapeutic intervention in order to improve the following deficits and impairments:  Abnormal gait, Decreased activity tolerance, Decreased range of motion, Decreased safety awareness, Increased edema, Decreased strength, Impaired UE functional use, Difficulty walking, Pain  Visit Diagnosis: Pain in right ankle and joints of right foot  Stiffness of right ankle, not elsewhere classified  Difficulty in walking, not elsewhere classified  Muscle weakness (generalized)     Problem List Patient Active Problem List   Diagnosis Date Noted  . Osteoporosis 10/13/2015  . Acrochordon 07/07/2015  . Jaw pain 12/30/2014  . Coronary artery calcification 12/23/2014  . Tachycardia 08/27/2014  . Cholelithiasis 05/17/2014  . GERD (gastroesophageal reflux disease) 01/02/2013  . TB lung, latent 05/01/2012  . Osteoarthritis 03/01/2012  . Severe major depression with psychotic features ([place]) 04/01/2011  . Hypothyroidism (acquired) 03/22/2011  . Healthcare maintenance 12/28/2010  . BPPV (benign paroxysmal positional vertigo) 11/19/2008  . Hyperlipidemia 11/30/2005  . Essential hypertension 11/30/2005  . LOW BACK PAIN 11/30/2005    Dorene Ar, PTA 07/20/2016, 11:29 AM  [place], [place], [address] Phone: [phone]   Fax:  [phone]  Name: Doris [name] MRN: [id number] Date of Birth: Oct 07, 1945

## 2016-07-23 ENCOUNTER — Encounter: Payer: Self-pay | Admitting: Physical Therapy

## 2016-07-23 ENCOUNTER — Ambulatory Visit (INDEPENDENT_AMBULATORY_CARE_PROVIDER_SITE_OTHER): Payer: Medicare Other | Admitting: Internal Medicine

## 2016-07-23 ENCOUNTER — Ambulatory Visit: Payer: Medicare Other | Admitting: Physical Therapy

## 2016-07-23 VITALS — BP 147/73 | HR 92 | Temp 98.3°F | Ht 64.0 in | Wt 158.2 lb

## 2016-07-23 DIAGNOSIS — E039 Hypothyroidism, unspecified: Secondary | ICD-10-CM

## 2016-07-23 DIAGNOSIS — I10 Essential (primary) hypertension: Secondary | ICD-10-CM | POA: Diagnosis not present

## 2016-07-23 DIAGNOSIS — R262 Difficulty in walking, not elsewhere classified: Secondary | ICD-10-CM | POA: Diagnosis not present

## 2016-07-23 DIAGNOSIS — M6281 Muscle weakness (generalized): Secondary | ICD-10-CM | POA: Diagnosis not present

## 2016-07-23 DIAGNOSIS — H8111 Benign paroxysmal vertigo, right ear: Secondary | ICD-10-CM

## 2016-07-23 DIAGNOSIS — M25671 Stiffness of right ankle, not elsewhere classified: Secondary | ICD-10-CM | POA: Diagnosis not present

## 2016-07-23 DIAGNOSIS — M25571 Pain in right ankle and joints of right foot: Secondary | ICD-10-CM

## 2016-07-23 DIAGNOSIS — H8110 Benign paroxysmal vertigo, unspecified ear: Secondary | ICD-10-CM | POA: Diagnosis not present

## 2016-07-23 NOTE — Therapy (Signed)
[place], [place], [address] Phone: [phone]   Fax:  [phone]  Physical Therapy Treatment/ Discharge  Patient Details  Name: Doris [name] MRN: [id number] Date of Birth: September 14, 1945 Referring Provider: Dr Vickki Hearing   Encounter Date: 07/23/2016      PT End of Session - 07/23/16 1030    Visit Number 8   Number of Visits 16   Date for PT Re-Evaluation 08/23/16   Authorization Type Medicare/ mediciad    PT Start Time 1018   PT Stop Time 1100   PT Time Calculation (min) 42 min   Activity Tolerance Patient tolerated treatment well   Behavior During Therapy [facility] for tasks assessed/performed      Past Medical History:  Diagnosis Date  . Acid reflux   . Back pain   . HLD (hyperlipidemia)   . Hypertension   . Insomnia   . Migraine   . Osteoarthritis    knee  . Psychosis    followed by Dr. Keenan Bachelor [phone]  . Thyroid nodule 2010   TSH 04/2008 = 0.296, no follow up noted in [facility]  . Vertigo     Past Surgical History:  Procedure Laterality Date  . TONSILLECTOMY    . UTERINE FIBROID SURGERY      There were no vitals filed for this visit.      Subjective Assessment - 07/23/16 1230    Subjective Patient feels like she is still making progress. She is still having some pain when she stands and walks but it is intermittent.    Pertinent History buldging disc; bilateral knee OA    How long can you sit comfortably? No limit   How long can you stand comfortably? depdening on the time of the day it can be < 5-10 minutes    How long can you walk comfortably? Depdening on the time of the day any type of walking can be sore.    Diagnostic tests X-ray: per patient bone spurring of the heel    Patient Stated Goals To have less pain             [facility] PT Assessment - 07/23/16 0001      Observation/Other Assessments   Focus on Therapeutic Outcomes (FOTO)  42% limitation      PROM   Right Ankle Dorsiflexion 6   Right Ankle Inversion 24   Right Ankle Eversion 18     Strength   Right Ankle Dorsiflexion 4+/5   Right Ankle Inversion 4+/5   Right Ankle Eversion 4+/5                     [facility] Adult PT Treatment/Exercise - 07/23/16 0001      Manual Therapy   Manual therapy comments gentle PROM into all palnes; Soft tissue mobilization to the posterior heel; Manual Gastroc stretch; Transverse friction massage to medial achilies.                   PT Short Term Goals - 07/20/16 1038      PT SHORT TERM GOAL #1   Title Patient will increase passive right ankel dorsi flexion to 8 degrees    Baseline 6   Time 4   Period Weeks   Status On-going     PT SHORT TERM GOAL #2   Title Patient will demsotrate 5/5 gross right    Baseline 4+/5, DF, INV, EV   Time 4   Period Weeks  Status On-going     PT SHORT TERM GOAL #3   Title Patient will be independent with initial HEP for stretching and strengthening    Time 4   Period Weeks   Status Achieved           PT Long Term Goals - 06/28/16 0932      PT LONG TERM GOAL #1   Title Patient will ambualte 3000' without significant increase in pain    Time 8   Period Weeks   Status New     PT LONG TERM GOAL #2   Title Patient will stand for 30 minutes without report of increased right ankle pain    Time 8   Period Weeks   Status New     PT LONG TERM GOAL #3   Title Patient will demonstrete a 50% limitation on FOTO    Time 8   Period Weeks   Status New               Plan - 07/23/16 1239    Clinical Impression Statement Patient feels like her vvertigo is a more pressing matter. She would like to focus on theat at this time. She will be discharged to her home program. Therapy reviewed her standing exercises. She was advised to continue these at home.    Clinical Presentation Stable   Clinical Decision Making Low   PT Frequency 2x / week   PT Duration 8 weeks   PT  Treatment/Interventions ADLs/Self Care Home Management;Electrical Stimulation;Cryotherapy;Iontophoresis 74m/ml Dexamethasone;Gait training;Moist Heat;Therapeutic activities;Therapeutic exercise;Balance training;Neuromuscular re-education;Patient/family education;Passive range of motion;Manual techniques;Splinting;Dry needling;Taping   PT Next Visit Plan DISCUSS CONTIUNE vs HOLD- has MD appt soon. pt is interested in inner ear treatment? cosider Ionto and or ultrasound depedning on pain. Patient will be ending predinisone this week. Continue with strengthening activity. Consider seated heel raise and posterior tib strengthening; Continue with manual stretching;    PT Home Exercise Plan ankle eversion; yellow band DF/ PF; ankle DF stretch    Consulted and Agree with Plan of Care Patient      Patient will benefit from skilled therapeutic intervention in order to improve the following deficits and impairments:  Abnormal gait, Decreased activity tolerance, Decreased range of motion, Decreased safety awareness, Increased edema, Decreased strength, Impaired UE functional use, Difficulty walking, Pain  Visit Diagnosis: Pain in right ankle and joints of right foot  Stiffness of right ankle, not elsewhere classified  Difficulty in walking, not elsewhere classified  Muscle weakness (generalized)   PHYSICAL THERAPY DISCHARGE SUMMARY  Visits from Start of Care: 8  Current functional level related to goals / functional outcomes: Improved abilityto walk and perfrom ADL's     Remaining deficits: Intermittent pain    Education / Equipment: HEP  Plan: Patient agrees to discharge.  Patient goals were not met. Patient is being discharged due to being pleased with the current functional level.  ?????     Problem List Patient Active Problem List   Diagnosis Date Noted  . Osteoporosis 10/13/2015  . Acrochordon 07/07/2015  . Jaw pain 12/30/2014  . Coronary artery calcification 12/23/2014  .  Tachycardia 08/27/2014  . Cholelithiasis 05/17/2014  . GERD (gastroesophageal reflux disease) 01/02/2013  . TB lung, latent 05/01/2012  . Osteoarthritis 03/01/2012  . Severe major depression with psychotic features [place] 04/01/2011  . Hypothyroidism (acquired) 03/22/2011  . Healthcare maintenance 12/28/2010  . BPPV (benign paroxysmal positional vertigo) 11/19/2008  . Hyperlipidemia 11/30/2005  . Essential hypertension 11/30/2005  .  LOW BACK PAIN 11/30/2005    Carney Living PT DPT  07/23/2016, 12:41 PM  [facility] [address] [place], [place], [address] Phone: [phone]   Fax:  [phone]  Name: Pranika Finks MRN: [id number] Date of Birth: [birth year]

## 2016-07-23 NOTE — Progress Notes (Signed)
   CC: "I keep getting dizzy and wanted to go to a physical therapist."  HPI:  [name] is a 71 y.o. woman with history of hypertension, hypothyroidism, and BPPV who presents for dizziness.  For the past several years she has had intermittent dizziness. Years ago, the episodes would last hours to a day and be so severe she would have vomiting and be largely incapacitated.  Now episodes last seconds to minutes and her provoked by turning her head and lying down.  Yesterday while driving home she had a episode that lasted seconds and felt like an earthquake shaking her vision and twisting.  No headaches, visual disturbances, diplopia, focal weakness. She has had troubles with falls last one several months ago.  She was previously told this was due to crystals in her ears and referred to physical therapy, but never went to PT for vestibular issues.  Today she says she has a physical therapy appointment 15 minutes and needs to leave as soon as possible.  Past Medical History:  Diagnosis Date  . Acid reflux   . Back pain   . HLD (hyperlipidemia)   . Hypertension   . Insomnia   . Migraine   . Osteoarthritis    knee  . Psychosis    followed by Dr. Keenan Bachelor [phone]  . Thyroid nodule 2010   TSH 04/2008 = 0.296, no follow up noted in [facility]  . Vertigo     Review of Systems:    Review of Systems  Constitutional: Negative for chills and fever.  Eyes: Negative for blurred vision and double vision.  Cardiovascular: Negative for chest pain and palpitations.  Neurological: Positive for dizziness. Negative for sensory change, focal weakness, loss of consciousness and headaches.   Physical Exam:  Vitals:   07/23/16 0924  BP: (!) 147/73  Pulse: 92  Temp: 98.3 F (36.8 C)  TempSrc: Oral  SpO2: 97%  Weight: 158 lb 3.2 oz (71.8 kg)  Height: 5\' 4"  (1.626 m)   Orthostatic VS for the past 24 hrs:  BP- Lying Pulse- Lying BP- Sitting Pulse- Sitting BP- Standing at 0  minutes Pulse- Standing at 0 minutes  07/23/16 0959 151/79 93 151/85 91 141/81 95   Physical Exam  Constitutional: She appears well-developed and well-nourished. No distress.  Eyes: Conjunctivae are normal. No scleral icterus.  Neck: Normal range of motion. Neck supple.  Cardiovascular: Normal rate and regular rhythm.   Pulmonary/Chest: Effort normal and breath sounds normal.  Neurological:  Dix-Hallpike with right head rotation elicits vertiginous symptoms and immediate nystagmus with dominant rotary component with fast phase rotating towards the ground.  Nystagmus and symptoms subsiding within 15 seconds. Dix-Hallpike with left head rotation negative Casual gait intact Romberg negative Fingers nose intact Extraocular muscles intact    Assessment & Plan:   See Encounters Tab for problem based charting.  Patient discussed with Dr. Daryll Drown

## 2016-07-23 NOTE — Assessment & Plan Note (Addendum)
History of positional dizziness and exam with nystagmus and symptoms evoked by Dix-Hallpike maneuver.  A/P BPPV, most likely right posterior canal. He does have prominent torsional component to her nystagmus, and torsional BPPV may be more refractory to canalith repositioning maneuvers.  Not orthostasis, no other neurologic symptoms to suggest stroke, tumor, demyelinating disease, or other neurologic syndromes. Time course and provocative exam not consistent with vestibular neuritis or Mnire's disease, though her history of symptoms years ago could be consistent with Mnire's.  -Refer to vestibular rehabilitation -Patient deferred Epley maneuvers today due to time

## 2016-07-23 NOTE — Patient Instructions (Addendum)
I have put in a new referral to physical therapy for vestibular rehabilitation for your BPPV, which I think is causing your dizziness.  How to Perform the Epley Maneuver The Epley maneuver is an exercise that relieves symptoms of vertigo. Vertigo is the feeling that you or your surroundings are moving when they are not. When you feel vertigo, you may feel like the room is spinning and have trouble walking. Dizziness is a little different than vertigo. When you are dizzy, you may feel unsteady or light-headed. You can do this maneuver at home whenever you have symptoms of vertigo. You can do it up to 3 times a day until your symptoms go away. Even though the Epley maneuver may relieve your vertigo for a few weeks, it is possible that your symptoms will return. This maneuver relieves vertigo, but it does not relieve dizziness. What are the risks? If it is done correctly, the Epley maneuver is considered safe. Sometimes it can lead to dizziness or nausea that goes away after a short time. If you develop other symptoms, such as changes in vision, weakness, or numbness, stop doing the maneuver and call your health care provider. How to perform the Epley maneuver 1. Sit on the edge of a bed or table with your back straight and your legs extended or hanging over the edge of the bed or table. 2. Turn your head halfway toward the affected ear or side. 3. Lie backward quickly with your head turned until you are lying flat on your back. You may want to position a pillow under your shoulders. 4. Hold this position for 30 seconds. You may experience an attack of vertigo. This is normal. 5. Turn your head to the opposite direction until your unaffected ear is facing the floor. 6. Hold this position for 30 seconds. You may experience an attack of vertigo. This is normal. Hold this position until the vertigo stops. 7. Turn your whole body to the same side as your head. Hold for another 30 seconds. 8. Sit back  up. You can repeat this exercise up to 3 times a day. Follow these instructions at home:  After doing the Epley maneuver, you can return to your normal activities.  Ask your health care provider if there is anything you should do at home to prevent vertigo. He or she may recommend that you: ? Keep your head raised (elevated) with two or more pillows while you sleep. ? Do not sleep on the side of your affected ear. ? Get up slowly from bed. ? Avoid sudden movements during the day. ? Avoid extreme head movement, like looking up or bending over. Contact a health care provider if:  Your vertigo gets worse.  You have other symptoms, including: ? Nausea. ? Vomiting. ? Headache. Get help right away if:  You have vision changes.  You have a severe or worsening headache or neck pain.  You cannot stop vomiting.  You have new numbness or weakness in any part of your body. Summary  Vertigo is the feeling that you or your surroundings are moving when they are not.  The Epley maneuver is an exercise that relieves symptoms of vertigo.  If the Epley maneuver is done correctly, it is considered safe. You can do it up to 3 times a day. This information is not intended to replace advice given to you by your health care provider. Make sure you discuss any questions you have with your health care provider. Document Released: 02/06/2013 Document  Revised: 12/23/2015 Document Reviewed: 12/23/2015 Elsevier Interactive Patient Education  2017 Else[employer] Inc.  Benign Positional Vertigo Vertigo is the feeling that you or your surroundings are moving when they are not. Benign positional vertigo is the most common form of vertigo. The cause of this condition is not serious (is benign). This condition is triggered by certain movements and positions (is positional). This condition can be dangerous if it occurs while you are doing something that could endanger you or others, such as driving. What are the  causes? In many cases, the cause of this condition is not known. It may be caused by a disturbance in an area of the inner ear that helps your brain to sense movement and balance. This disturbance can be caused by a viral infection (labyrinthitis), head injury, or repetitive motion. What increases the risk? This condition is more likely to develop in:  Women.  People who are 52 years of age or older.  What are the signs or symptoms? Symptoms of this condition usually happen when you move your head or your eyes in different directions. Symptoms may start suddenly, and they usually last for less than a minute. Symptoms may include:  Loss of balance and falling.  Feeling like you are spinning or moving.  Feeling like your surroundings are spinning or moving.  Nausea and vomiting.  Blurred vision.  Dizziness.  Involuntary eye movement (nystagmus).  Symptoms can be mild and cause only slight annoyance, or they can be severe and interfere with daily life. Episodes of benign positional vertigo may return (recur) over time, and they may be triggered by certain movements. Symptoms may improve over time. How is this diagnosed? This condition is usually diagnosed by medical history and a physical exam of the head, neck, and ears. You may be referred to a health care provider who specializes in ear, nose, and throat (ENT) problems (otolaryngologist) or a provider who specializes in disorders of the nervous system (neurologist). You may have additional testing, including:  MRI.  A CT scan.  Eye movement tests. Your health care provider may ask you to change positions quickly while he or she watches you for symptoms of benign positional vertigo, such as nystagmus. Eye movement may be tested with an electronystagmogram (ENG), caloric stimulation, the Dix-Hallpike test, or the roll test.  An electroencephalogram (EEG). This records electrical activity in your brain.  Hearing tests.  How is  this treated? Usually, your health care provider will treat this by moving your head in specific positions to adjust your inner ear back to normal. Surgery may be needed in severe cases, but this is rare. In some cases, benign positional vertigo may resolve on its own in 2-4 weeks. Follow these instructions at home: Safety  Move slowly.Avoid sudden body or head movements.  Avoid driving.  Avoid operating heavy machinery.  Avoid doing any tasks that would be dangerous to you or others if a vertigo episode would occur.  If you have trouble walking or keeping your balance, try using a cane for stability. If you feel dizzy or unstable, sit down right away.  Return to your normal activities as told by your health care provider. Ask your health care provider what activities are safe for you. General instructions  Take over-the-counter and prescription medicines only as told by your health care provider.  Avoid certain positions or movements as told by your health care provider.  Drink enough fluid to keep your urine clear or pale yellow.  Keep all follow-up  visits as told by your health care provider. This is important. Contact a health care provider if:  You have a fever.  Your condition gets worse or you develop new symptoms.  Your family or friends notice any behavioral changes.  Your nausea or vomiting gets worse.  You have numbness or a "pins and needles" sensation. Get help right away if:  You have difficulty speaking or moving.  You are always dizzy.  You faint.  You develop severe headaches.  You have weakness in your legs or arms.  You have changes in your hearing or vision.  You develop a stiff neck.  You develop sensitivity to light. This information is not intended to replace advice given to you by your health care provider. Make sure you discuss any questions you have with your health care provider. Document Released: 11/09/2005 Document Revised:  07/10/2015 Document Reviewed: 05/27/2014 Elsevier Interactive Patient Education  [employer].

## 2016-07-27 NOTE — Progress Notes (Signed)
Internal Medicine Clinic Attending  Case discussed with Dr. O'Sullivan at the time of the visit.  We reviewed the resident's history and exam and pertinent patient test results.  I agree with the assessment, diagnosis, and plan of care documented in the resident's note. 

## 2016-07-29 ENCOUNTER — Telehealth: Payer: Self-pay | Admitting: Internal Medicine

## 2016-07-29 ENCOUNTER — Other Ambulatory Visit: Payer: Self-pay | Admitting: *Deleted

## 2016-07-29 DIAGNOSIS — I10 Essential (primary) hypertension: Secondary | ICD-10-CM

## 2016-07-29 NOTE — Telephone Encounter (Signed)
Refills requested Also pt ask about her therapy for the neuro problem, I see in visit note it was to be requested but not under referrals, did I miss it? appt made w/ dr Philipp Ovens for 7/16 to discuss her med doses

## 2016-07-29 NOTE — Telephone Encounter (Signed)
Pt would like a call back about some medications that were denied.

## 2016-07-30 MED ORDER — RANITIDINE HCL 150 MG PO TABS: 150.0000 mg | ORAL_TABLET | Freq: Every day | ORAL | 3 refills | Status: DC | PRN

## 2016-07-30 MED ORDER — QUINAPRIL HCL 10 MG PO TABS: 10.0000 mg | ORAL_TABLET | Freq: Every day | ORAL | 3 refills | Status: DC

## 2016-07-30 NOTE — Telephone Encounter (Signed)
I placed an order for PT vestibular rehab, and I see the order under the encounter but nothing in the Referrals tab.  Maybe it did not get processed.

## 2016-08-05 DIAGNOSIS — M722 Plantar fascial fibromatosis: Secondary | ICD-10-CM | POA: Diagnosis not present

## 2016-08-05 DIAGNOSIS — M76821 Posterior tibial tendinitis, right leg: Secondary | ICD-10-CM | POA: Diagnosis not present

## 2016-08-09 ENCOUNTER — Ambulatory Visit (INDEPENDENT_AMBULATORY_CARE_PROVIDER_SITE_OTHER): Payer: Medicare Other | Admitting: Family Medicine

## 2016-08-09 VITALS — BP 130/74 | Ht 64.0 in | Wt 155.0 lb

## 2016-08-09 DIAGNOSIS — R269 Unspecified abnormalities of gait and mobility: Secondary | ICD-10-CM | POA: Diagnosis present

## 2016-08-09 DIAGNOSIS — M722 Plantar fascial fibromatosis: Secondary | ICD-10-CM

## 2016-08-10 DIAGNOSIS — R269 Unspecified abnormalities of gait and mobility: Secondary | ICD-10-CM | POA: Insufficient documentation

## 2016-08-10 NOTE — Assessment & Plan Note (Signed)
She has significant structural changes within her foot to contribute to her abnormalities with pain and ambulation - Custom orthotics today. - Was brought more to neutral and less outward rotation of her feet when reevaluated with orthotics  Patient was fitted for a standard, cushioned, semi-rigid orthotic. The orthotic was heated and afterward the patient stood on the orthotic blank positioned on the orthotic stand. The patient was positioned in subtalar neutral position and 10 degrees of ankle dorsiflexion in a weight bearing stance. After completion of molding, a stable base was applied to the orthotic blank. The blank was ground to a stable position for weight bearing. Size: 7 Base: Blue EVA Additional Posting and Padding: None The patient ambulated these, and they were very comfortable.  I spent 45 minutes with this patient, greater than 50% was face-to-face time counseling regarding the below diagnosis.

## 2016-08-10 NOTE — Progress Notes (Signed)
  Doris [name] - 71 y.o. female MRN [id number]  Date of birth: 1945/12/20  SUBJECTIVE:  Including CC & ROS.   Doris [name] is a 71 year old female that is presenting for foot pain. She reports that she has chronic plantar fasciitis on her right foot and is going to physical therapy for that. She also has a calcaneal bone spur. She feels that the pain in her foot has been ongoing for years now. She also has bilateral hallux valgus and pes planus. She has a history of falls and attributes that to being dizzy but having bad feet as well. She is not currently taking any medications for the pain.  ROS: No unexpected weight loss, fever, chills, swelling, instability, muscle pain, numbness/tingling, redness, otherwise see HPI   HISTORY: Past Medical, Surgical, Social, and Family History Reviewed & Updated per EMR.   Pertinent Historical Findings include: PMHx: BPPV, hypertension Surgical:   None Social:  None FHx: Heart disease Medications:   DATA REVIEWED: X-rays that orthopedic office  PHYSICAL EXAM:  VS: BP 130/74   Ht 5\' 4"  (1.626 m)   Wt 155 lb (70.3 kg)   LMP 03/27/1984   BMI 26.61 kg/m  PHYSICAL EXAM: Gen: NAD, alert, cooperative with exam, well-appearing HEENT: clear conjunctiva, EOMI CV:  no edema, capillary refill brisk,  Resp: non-labored, normal speech Skin: no rashes, normal turgor  Neuro: no gross deficits.  Psych:  alert and oriented MSK:  Feet:  Hallux valgus bilaterally, pes planus bilaterally. Loss of longitudinal arch and transverse arch. Hammertoes present bilaterally. Tenderness to palpation over the plantar fascia at the calcaneus. Normal plantar dorsiflexion. Normal strength resistance. Negative anterior drawer. Negative talar tilt. Pronated when ambulating. Neurovascular intact.  Gait:  Walks with Doris [name] rotation of her feet   ASSESSMENT & PLAN:   Abnormality of gait She has significant structural changes within her foot to contribute to her  abnormalities with pain and ambulation - Custom orthotics today. - Was brought more to neutral and less outward rotation of her feet when reevaluated with orthotics  Patient was fitted for a standard, cushioned, semi-rigid orthotic. The orthotic was heated and afterward the patient stood on the orthotic blank positioned on the orthotic stand. The patient was positioned in subtalar neutral position and 10 degrees of ankle dorsiflexion in a weight bearing stance. After completion of molding, a stable base was applied to the orthotic blank. The blank was ground to a stable position for weight bearing. Size: 7 Base: Blue EVA Additional Posting and Padding: None The patient ambulated these, and they were very comfortable.  I spent 45 minutes with this patient, greater than 50% was face-to-face time counseling regarding the below diagnosis.

## 2016-08-21 ENCOUNTER — Other Ambulatory Visit: Payer: Self-pay | Admitting: Physician Assistant

## 2016-08-26 ENCOUNTER — Ambulatory Visit (INDEPENDENT_AMBULATORY_CARE_PROVIDER_SITE_OTHER): Payer: Medicare Other | Admitting: Internal Medicine

## 2016-08-26 ENCOUNTER — Encounter: Payer: Self-pay | Admitting: Internal Medicine

## 2016-08-26 DIAGNOSIS — M62838 Other muscle spasm: Secondary | ICD-10-CM | POA: Diagnosis present

## 2016-08-26 MED ORDER — BACLOFEN 10 MG PO TABS: 10.0000 mg | ORAL_TABLET | Freq: Every evening | ORAL | 0 refills | Status: DC | PRN

## 2016-08-26 NOTE — Patient Instructions (Addendum)
Ms. Kinser,   For your neck pain, I have written you a prescription for baclofen, try taking this at night the first time that you take it. If you take it during the day he may have some sleepiness so avoid driving.   Return to the clinic in 3 months to see your regular doctor

## 2016-08-26 NOTE — Progress Notes (Signed)
   CC: Neck pain  HPI:  [name] is a 71 y.o. with past medical history of coronary artery disease, BPPV, hypertension, osteoporosis and osteoarthritis.  Past Medical History:  Diagnosis Date  . Acid reflux   . Back pain   . HLD (hyperlipidemia)   . Hypertension   . Insomnia   . Migraine   . Osteoarthritis    knee  . Psychosis    followed by Dr. Keenan Bachelor [phone]  . Thyroid nodule 2010   TSH 04/2008 = 0.296, no follow up noted in [facility]  . Vertigo    Review of Systems:  refer to history of present illness and assessment and plan stop her pertinent review of systems, all others reviewed and negative  Physical Exam:  Vitals:   08/26/16 1528  BP: (!) 149/77  Pulse: 79  Temp: 98.3 F (36.8 C)  TempSrc: Oral  SpO2: 100%  Weight: 160 lb 1.6 oz (72.6 kg)  Height: 5\' 4"  (1.626 m)   Physical Exam  Constitutional: She is oriented to person, place, and time and well-developed, well-nourished, and in no distress. No distress.  HENT:  Head: Normocephalic and atraumatic.  Pulmonary/Chest: Effort normal. No respiratory distress.  Musculoskeletal: She exhibits no edema.  She has muscle spasm and tenderness over her trapezius. She has full range of motion of her neck is only slightly limited by tightness.  Neurological: She is alert and oriented to person, place, and time.  Strength 5/5 with elbow flexion and extension and handgrip. Sensation intact in upper extremities  Skin: She is not diaphoretic.   Assessment & Plan:   Paraspinal muscle spasm  Describes a week and a half of muscle spasm which started after she slept in a funny way. He denies trauma to her neck. Associated with a headache over the back of her head. No tingling, numbness, or weakness in her upper extremities. No prior imaging of her neck. Has had this in the past and responded well to Flexeril. Tylenol has provided her minimal pain relief. She cannot tolerate NSAIDs due to GI upset. -Dr. Heber [place]  provided OME stretches.  - prescribed baclofen 10 mg qHS PRN #14   See Encounters Tab for problem based charting.  Patient seen with Dr. Angelia Mould

## 2016-08-26 NOTE — Assessment & Plan Note (Signed)
Describes a week and a half of muscle spasm which started after she slept in a funny way. He denies trauma to her neck. Associated with a headache over the back of her head. No tingling, numbness, or weakness in her upper extremities. No prior imaging of her neck. Has had this in the past and responded well to Flexeril. Tylenol has provided her minimal pain relief. She cannot tolerate NSAIDs due to GI upset. -Dr. Heber [place] provided OME stretches.  - prescribed baclofen 10 mg qHS PRN #14

## 2016-08-27 NOTE — Progress Notes (Signed)
Internal Medicine Clinic Attending  I saw and evaluated the patient.  I personally confirmed the key portions of the history and exam documented by Dr. Hetty Ely and I reviewed pertinent patient test results.  The assessment, diagnosis, and plan were formulated together and I agree with the documentation in the resident's note.  Decision today to treat with OMT was based on Physical Exam: on exam she has bilateral cervical paraspinal spasm and tenderness, she has reduced rotation of her head to only 80 degrees to the right.  After verbal consent patient was treated with direct myofascial release and muscle energy techniques in cervical paraspinal area areas  Patient tolerated the procedure well with improvement in symptoms

## 2016-08-30 ENCOUNTER — Encounter: Payer: Medicare Other | Admitting: Internal Medicine

## 2016-08-30 ENCOUNTER — Telehealth: Payer: Self-pay | Admitting: Internal Medicine

## 2016-08-30 ENCOUNTER — Ambulatory Visit (INDEPENDENT_AMBULATORY_CARE_PROVIDER_SITE_OTHER): Payer: Medicare Other | Admitting: Internal Medicine

## 2016-08-30 ENCOUNTER — Encounter: Payer: Self-pay | Admitting: Internal Medicine

## 2016-08-30 VITALS — BP 142/75 | HR 85 | Temp 98.5°F | Ht 64.0 in | Wt 159.4 lb

## 2016-08-30 DIAGNOSIS — M62838 Other muscle spasm: Secondary | ICD-10-CM | POA: Diagnosis not present

## 2016-08-30 DIAGNOSIS — Z79899 Other long term (current) drug therapy: Secondary | ICD-10-CM | POA: Diagnosis not present

## 2016-08-30 DIAGNOSIS — I10 Essential (primary) hypertension: Secondary | ICD-10-CM | POA: Diagnosis present

## 2016-08-30 DIAGNOSIS — E039 Hypothyroidism, unspecified: Secondary | ICD-10-CM

## 2016-08-30 DIAGNOSIS — E785 Hyperlipidemia, unspecified: Secondary | ICD-10-CM

## 2016-08-30 DIAGNOSIS — H8111 Benign paroxysmal vertigo, right ear: Secondary | ICD-10-CM

## 2016-08-30 DIAGNOSIS — E890 Postprocedural hypothyroidism: Secondary | ICD-10-CM | POA: Diagnosis not present

## 2016-08-30 MED ORDER — CYCLOBENZAPRINE HCL 5 MG PO TABS: 5.0000 mg | ORAL_TABLET | Freq: Every evening | ORAL | 0 refills | Status: DC | PRN

## 2016-08-30 MED ORDER — SIMVASTATIN 20 MG PO TABS: 20.0000 mg | ORAL_TABLET | Freq: Every day | ORAL | 5 refills | Status: DC

## 2016-08-30 NOTE — Assessment & Plan Note (Signed)
Patient has a history of multinodular goiter, now s/p radioiodine ablation. She was previously following with Dr. Buddy Duty (endocrinology) who has now signed off per last progress note. She has been well controlled on synthroid 25 mcg. Last TSH was checked in May of 2017 and within normal limits. Plan was to repeat TSH today, however patient refused and left prior to lab draw. Will readdress at her next visit.  -- Check TSH at next visit -- Continue synthroid 25 mcg daily for now

## 2016-08-30 NOTE — Assessment & Plan Note (Addendum)
Patient was last seen on 08/26/16 at which point she was complaining of muscle spasms in her neck for a week and a half. She was treated conservatively with tylenol and baclofen and given instructions on home stretching exercises (cannot tolerate NSAIDs due to GI upset). Today, patient initially reported her pain was improved but not resolved. She denied symptoms of radiculopathy and paresthesias. She endorsed muscle pains down the center of her posterior neck with associated diffuse headaches. Neck ROM was fully intact and neuro exam was unremarkable. She initially requested a prescription for flexeril, as she has taken this in the past with relief and did not find the baclofen helpful. However later in the visit, patient began to repeatedly ask me for a Vicodin prescription. After informing patient that opioid therapy was not appropriate for neck spasms and unsafe given her xanax use (prescribed per psychiatry), patient became very upset. She proceeded to beg me, "please!" repeatedly and attempted to reassure me that she would not take it with her xanax. She initially requested "only 30 pills", then 20 when I refused, then "only 10". She reported taking pain medication that she received from friend. She then requested tylenol #3 or oxycodone, and informed me that she was not picky. I again informed patient that opioid therapy was inappropriate and that her neck pain should continue to improve over the next few weeks. She angrily told me that it was unfair that her friend received pain medicine and she didn't. She was appalled that I would let her remain in pain and then threatened that if I did not provide her with a prescription, she would continue taking it from her friend. I advised patient against taking other people's medication especially with her xanax use. Patient was unable to be redirected, left clinic angrily, and refused blood work for her thyroid.  -- Multiple yellow flags exhibited during today's  visit regarding narcotic seeking behavior  -- Will continue to provide support and address at future visit  -- Short course of flexeril provided (#5 tabs) to be taken PRN - advised caution with xanax

## 2016-08-30 NOTE — Telephone Encounter (Signed)
PER [facility] please place this Order under Referrals for Dr. Jenetta Downer' [place] as he is no longer with the [place] [facility], rather than Procedures and Neuro Rehab will call the patient and get the patient schedule. Please Advise.

## 2016-08-30 NOTE — Patient Instructions (Signed)
Ms. Drennan,  It was a pleasure seeing you today. I am sorry to hear about your next pain. I have given you a prescription for flexeril. You may take this at night prior to bed as needed. Please continue to alternate heat and ice to the affected area. I have also placed an additional physical therapy referral for your neck. Please check your blood pressure 2-3 times a week for the next month. You may continue to take lopressor 25 mg and amlodipine 5 mg, but I would like for you to increase your quinapril to 10 mg daily. Please follow up with [place] in 4 weeks to have your blood pressure rechecked. I will call you with the results of your thyroid blood work today. If you have any questions or concerns, call our clinic at [phone] or after hours call [phone] and ask for the internal medicine resident on call. Thank you!  - Dr. Philipp Ovens

## 2016-08-30 NOTE — Assessment & Plan Note (Signed)
Patient was previously taking simvastatin 20 mg daily for primary prevention for many years. At a prior visit  4 months ago, patient believed that her simvastatin was causing side effects of insomnia and hallucination and requested to discontinue the medicine. I reassured patient that her statin was unlikely to cause these side effects. At that time, her ASCVD 10 year risk was 15.9% calculated based on prior lipid panel in 2016. Given her risk score and reported side effects, she agreed to a trial of high intensity statin. She has since been taking atorvastatin 40 mg without issue. Today, she is requesting to switch back to simvastatin. She has decided that she does not like Lipitor. She denies that she is experiencing any side effect or intolerance. She reports that she did her own research and her friends informed her that Lipitor was harmful. I attempted to reassure patient however she refused to elaborate on her concerns and is now refusing to take Lipitor. I have agreed to refill her simvastatin as I would prefer she remain on some form of statin therapy given her high ASCVD risk and uncontrolled BP.  -- Stop atorvastatin  -- Restart Simvastatin 20 mg daily

## 2016-08-30 NOTE — Telephone Encounter (Signed)
Placed referral for Neuro Rehab.  Please let me know if anything else needs to be done.

## 2016-08-30 NOTE — Assessment & Plan Note (Addendum)
Blood pressure today remains uncontrolled, 142/75 and 151/95 on recheck. Patient's amlodipine was increased at a prior visit 4 months ago, however today she reports she never increased her dose. She is also prescribed quinapril 10 mg daily, however only takes half of the pill. She reports dizziness when she takes the whole pill. She is also prescribed metoprolol 25 mg BID (per cards for palpitations) but again, only takes half the dose. Patient is very suspicious of many of her medications. Compliance has been an ongoing issue as she refuses to communicate her concerns with me. Patient has agreed to increase her quinapril to 10 mg daily. She has a blood pressure cuff at home. I have instructed patient to check her BP if she develops dizziness. I have provided her with a home BP log to bring to her next appointment. Patient was instructed to check her BP at home 2-3 times a week. She currently believes her BP is only elevated because of her neck pain, however patient has been uncontrolled per chart review since 2014.  -- Increase quinapril to 10 mg daily  -- Continue amlodipine 5 mg daily  -- Continue metoprolol 12.5 mg BID -- Follow up 4 weeks with home BP log

## 2016-08-30 NOTE — Progress Notes (Signed)
   CC: HTN follow up, neck pain   HPI:  [name] is a 71 y.o. female with past medical history outlined below here for follow up of her HTN and neck pain. For the details of today's visit, please refer to the assessment and plan.  Past Medical History:  Diagnosis Date  . Acid reflux   . Back pain   . HLD (hyperlipidemia)   . Hypertension   . Insomnia   . Migraine   . Osteoarthritis    knee  . Psychosis    followed by Dr. Keenan Bachelor [phone]  . Thyroid nodule 2010   TSH 04/2008 = 0.296, no follow up noted in [facility]  . Vertigo     Review of Systems:  Denies chest pain and SOB  Physical Exam:  Vitals:   08/30/16 1543  BP: (!) 142/75  Pulse: 85  Temp: 98.5 F (36.9 C)  TempSrc: Oral  SpO2: 95%  Weight: 159 lb 6.4 oz (72.3 kg)  Height: 5\' 4"  (1.626 m)    Constitutional: NAD, appears comfortable HEENT: Atraumatic, normocephalic. PERRL, anicteric sclera.  Neck: Supple, trachea midline.  Cardiovascular: RRR, no murmurs, rubs, or gallops.  Pulmonary/Chest: CTAB Extremities: Warm and well perfused. No edema.  Neurological: A&Ox3, CN II - XII grossly intact. Upper extremity strength 5/5 and sensation to light touch intact bilaterally. Psychiatric: Normal mood and affect  Assessment & Plan:   See Encounters Tab for problem based charting.  Patient discussed with Dr. Lynnae January

## 2016-08-31 NOTE — Progress Notes (Signed)
Internal Medicine Clinic Attending  Case discussed with Dr. Guilloud at the time of the visit.  We reviewed the resident's history and exam and pertinent patient test results.  I agree with the assessment, diagnosis, and plan of care documented in the resident's note.  

## 2016-09-08 DIAGNOSIS — F411 Generalized anxiety disorder: Secondary | ICD-10-CM | POA: Diagnosis not present

## 2016-09-08 DIAGNOSIS — F331 Major depressive disorder, recurrent, moderate: Secondary | ICD-10-CM | POA: Diagnosis not present

## 2016-09-15 ENCOUNTER — Other Ambulatory Visit: Payer: Self-pay | Admitting: Internal Medicine

## 2016-09-15 DIAGNOSIS — Z0001 Encounter for general adult medical examination with abnormal findings: Secondary | ICD-10-CM | POA: Diagnosis not present

## 2016-09-15 DIAGNOSIS — M1712 Unilateral primary osteoarthritis, left knee: Secondary | ICD-10-CM | POA: Diagnosis not present

## 2016-09-15 DIAGNOSIS — R7303 Prediabetes: Secondary | ICD-10-CM | POA: Diagnosis not present

## 2016-09-15 DIAGNOSIS — I10 Essential (primary) hypertension: Secondary | ICD-10-CM | POA: Diagnosis not present

## 2016-09-15 DIAGNOSIS — E890 Postprocedural hypothyroidism: Secondary | ICD-10-CM | POA: Diagnosis not present

## 2016-09-15 DIAGNOSIS — E663 Overweight: Secondary | ICD-10-CM | POA: Diagnosis not present

## 2016-09-15 DIAGNOSIS — E782 Mixed hyperlipidemia: Secondary | ICD-10-CM | POA: Diagnosis not present

## 2016-09-15 DIAGNOSIS — Z136 Encounter for screening for cardiovascular disorders: Secondary | ICD-10-CM | POA: Diagnosis not present

## 2016-09-20 ENCOUNTER — Ambulatory Visit: Payer: Medicare Other | Attending: Sports Medicine | Admitting: Rehabilitative and Restorative Service Providers"

## 2016-09-20 DIAGNOSIS — H8111 Benign paroxysmal vertigo, right ear: Secondary | ICD-10-CM | POA: Diagnosis not present

## 2016-09-20 DIAGNOSIS — R2681 Unsteadiness on feet: Secondary | ICD-10-CM | POA: Diagnosis not present

## 2016-09-20 DIAGNOSIS — R2689 Other abnormalities of gait and mobility: Secondary | ICD-10-CM | POA: Diagnosis not present

## 2016-09-20 NOTE — Patient Instructions (Signed)
Habituation - Tip Card  1.The goal of habituation training is to assist in decreasing symptoms of vertigo, dizziness, or nausea provoked by specific head and body motions. 2.These exercises may initially increase symptoms; however, be persistent and work through symptoms. With repetition and time, the exercises will assist in reducing or eliminating symptoms. 3.Exercises should be stopped and discussed with the therapist if you experience any of the following: - Sudden change or fluctuation in hearing - New onset of ringing in the ears, or increase in current intensity - Any fluid discharge from the ear - Severe pain in neck or back - Extreme nausea  Copyright  VHI. All rights reserved.   Habituation - Sit to Side-Lying   Sit on edge of bed. Lie down onto the right side and hold until dizziness stops, plus 20 seconds.  Return to sitting and wait until dizziness stops, plus 20 seconds.  Repeat to the left side. Repeat sequence 5 times per session. Do 2 sessions per day.  Copyright  VHI. All rights reserved.     

## 2016-09-20 NOTE — Therapy (Signed)
[facility] [address] [place], [place], [address] Phone: [phone]   Fax:  [phone]  Physical Therapy Evaluation  Patient Details  Name: Doris [name] MRN: [id number] Date of Birth: [birth year] Referring Provider: Larey Dresser, MD  Encounter Date: 09/20/2016      PT End of Session - 09/20/16 1009    Visit Number 1   Number of Visits 8   Date for PT Re-Evaluation 11/19/16   Authorization Type Medicare/ mediciad    PT Start Time 0934   PT Stop Time 1014   PT Time Calculation (min) 40 min   Activity Tolerance Patient tolerated treatment well   Behavior During Therapy [facility] for tasks assessed/performed      Past Medical History:  Diagnosis Date  . Acid reflux   . Back pain   . HLD (hyperlipidemia)   . Hypertension   . Insomnia   . Migraine   . Osteoarthritis    knee  . Psychosis    followed by Dr. Keenan Bachelor [phone]  . Thyroid nodule 2010   TSH 04/2008 = 0.296, no follow up noted in [facility]  . Vertigo     Past Surgical History:  Procedure Laterality Date  . TONSILLECTOMY    . UTERINE FIBROID SURGERY      There were no vitals filed for this visit.       Subjective Assessment - 09/20/16 0936    Subjective The patient reports that her primary reason for being seen today is for vertigo.  She also had recent muscle spasms in her neck and feels it is subsiding.  The patient notes onset of vertigo 2 years ago "it really was bad" noting they thought I had a tumor.  The initial onset was characterized by "hard to walk, nauseous, on the verge of fainting" and lasted x days.  She notes symptoms improved and now she gets intermittent symptoms in which she gets vertigo x a few weeks and then it resolves.  She notes the room spins at times when she lays down.    Pertinent History buldging disc; bilateral knee OA    Patient Stated Goals "tired of falling" "tired of walking like a drunkard"; get rid of  bad vertigo feeling/nausea.   Currently in Pain? No/denies            [facility] PT Assessment - 09/20/16 0940      Assessment   Medical Diagnosis BPPV   Referring Provider Larey Dresser, MD   Onset Date/Surgical Date --  intermittent over past 2 years   Prior Therapy for plantar fascitis     Precautions   Precautions Fall     Restrictions   Weight Bearing Restrictions No     Balance Screen   Has the patient fallen in the past 6 months Yes   How many times? "a lot of them I catch before I hit the ground"; unable to estimate # of falls  (maybe 1x evey 1-2 months)   Has the patient had a decrease in activity level because of a fear of falling?  No   Is the patient reluctant to leave their home because of a fear of falling?  No     Home Environment   Living Environment Private residence   Type of [place] Access Level entry   Home Layout One level   [place] None     Prior Function   Level of Independence Independent   Leisure works out  at [facility] for senior exercise program.     Cognition   Overall Cognitive Status Within Functional Limits for tasks assessed     Observation/Other Assessments   Focus on Therapeutic Outcomes (FOTO)  23%   Other Surveys  Other Surveys   Dizziness Handicap Inventory [facility])  76%            Vestibular Assessment - 09/20/16 0944      Vestibular Assessment   General Observation patient walks into clinic independently     Symptom Behavior   Type of Dizziness Spinning   Frequency of Dizziness daily   Duration of Dizziness seconds   Aggravating Factors Lying supine;Turning head quickly;Turning body quickly;Activity in general   Relieving Factors Head stationary     Occulomotor Exam   Occulomotor Alignment Normal   Spontaneous Absent   Gaze-induced Absent   Smooth Pursuits Intact   Saccades Intact     Vestibulo-Occular Reflex   VOR 1 Head Only (x 1 viewing) slow VOR provokes mild apprehension and  sense of dizziness   Comment head impulse test=provokes nausea with refixation saccade to both sides indicating decreased VOR     Positional Testing   Dix-Hallpike Dix-Hallpike Right;Dix-Hallpike Left   Horizontal Canal Testing Horizontal Canal Right;Horizontal Canal Left     Dix-Hallpike Right   Dix-Hallpike Right Duration large amplitude nystagmus visible in room light lasting >20 seconds; nystagmus appears to initially be upbeating with R rotary movement--as nystagmus settle it appears to have a horizontal component *will treat R posterior canal and then reassess and treat as indicated.   Dix-Hallpike Right Symptoms Upbeat, right rotatory nystagmus     Dix-Hallpike Left   Dix-Hallpike Left Duration small amplitude nystagmus noted in room light   Dix-Hallpike Left Symptoms Downbeat, left rotatory nystagmus     Horizontal Canal Right   Horizontal Canal Right Duration none   Horizontal Canal Right Symptoms Normal     Horizontal Canal Left   Horizontal Canal Left Duration none   Horizontal Canal Left Symptoms Normal        Objective measurements completed on examination: See above findings.           Vestibular Treatment/Exercise - 09/20/16 0953      Vestibular Treatment/Exercise   Vestibular Treatment Provided Canalith Repositioning;Habituation   Canalith Repositioning Epley Manuever Right   Habituation Exercises Nestor Lewandowsky      EPLEY MANUEVER RIGHT   Number of Reps  2   Response Details  Patient tolerated Epley's well with mild nausea noted.  Held positions to let symptoms settle.   Repeated and on second rep, still noted same intensity/duration of nystagmus.  Therefore, also instructed in brandt daroff habituation exercises for home.     Nestor Lewandowsky   Number of Reps  2   Symptom Description  symptoms improved with repetition               PT Education - 09/20/16 1007    Education provided Yes   Education Details Habituation HEP   Person(s) Educated  Patient   Methods Explanation;Demonstration;Handout   Comprehension Verbalized understanding;Returned demonstration            PT Long Term Goals - 09/20/16 1441      PT LONG TERM GOAL #1   Title The patient will be indep with HEP for habituation and balance activities as indicated.   Time 4   Period Weeks   Target Date 11/19/16  8 week target date due to patient  travelling x next 4 weeks     PT LONG TERM GOAL #2   Title The patient will reduce DHI from 76% to < or equal to 55% to demo dec'd self perception of dizziness.   Time 4   Period Weeks   Target Date 11/19/16     PT LONG TERM GOAL #3   Title The patient will have negative positional testing indicating resolution of R BPPV.   Time 4   Period Weeks   Target Date 11/19/16     PT LONG TERM GOAL #4   Title The patient will be further assessed for functional balance (Berg, FGA, etc) and goal to follow, as appropriate.   Time 4   Period Weeks   Target Date 11/19/16                Plan - 2016/09/29 1444    Clinical Impression Statement The patient is a 71 year old female presenting to OP PT with h/o intermittent vertigo x 2 years described as spinning sensation, accompanied by imbalance and nausea.  She presents today with positive R dix hallpike testing indicating right posterior canalithiasis.  Her positional testing may indicate multi-canal BPPV as she also had a mild downbeat, L rotary nystagmus noted with L dix hallpike (I anticipate this may be a reversal of R posterior canal due to severity of BPPV), and also had more of a horizontal appearing nystagmus as upbeating, right rotary nystagmus cleared during R dix hallpike.    PT initiated treatment with R Epley's today and habituation for HEP.  Plan to reassess all positions to proceed through treatment at next visit.     History and Personal Factors relevant to plan of care: recent neck pain, frequent falls, unsteadiness   Clinical Presentation Evolving    Clinical Presentation due to: due to intermittent nature, recent worsening of symptoms   Clinical Decision Making Moderate  due to possible multi-canal presentation, report of frequent falls, recent neck pain   Rehab Potential Good   PT Frequency 2x / week  + evaluation   PT Duration 4 weeks   PT Treatment/Interventions ADLs/Self Care Home Management;Canalith Repostioning;Therapeutic activities;Therapeutic exercise;Balance training;Neuromuscular re-education;Patient/family education;Gait training;Vestibular;Functional mobility training   PT Next Visit Plan Recheck R dix hallpike, other positions and treat as indicated.  Assess balance (Berg, FGA), provide HEP for balance as patient will be out of town x 1 month.    Consulted and Agree with Plan of Care Patient      Patient will benefit from skilled therapeutic intervention in order to improve the following deficits and impairments:  Abnormal gait, Decreased activity tolerance, Dizziness, Decreased mobility, Decreased balance, Decreased safety awareness  Visit Diagnosis: BPPV (benign paroxysmal positional vertigo), right  Other abnormalities of gait and mobility  Unsteadiness on feet      G-Codes - 2016/09/29 1445    Functional Assessment Tool Used (Outpatient Only) DHI=76%   Functional Limitation Mobility: Walking and moving around   Mobility: Walking and Moving Around Current Status [phone]) At least 60 percent but less than 80 percent impaired, limited or restricted   Mobility: Walking and Moving Around Goal Status [phone]) At least 40 percent but less than 60 percent impaired, limited or restricted       Problem List Patient Active Problem List   Diagnosis Date Noted  . Muscle spasms of neck 08/26/2016  . Abnormality of gait 08/10/2016  . Osteoporosis 10/13/2015  . Acrochordon 07/07/2015  . Jaw pain 12/30/2014  . Coronary artery  calcification 12/23/2014  . Tachycardia 08/27/2014  . Cholelithiasis 05/17/2014  . GERD  (gastroesophageal reflux disease) 01/02/2013  . TB lung, latent 05/01/2012  . Osteoarthritis 03/01/2012  . Severe major depression with psychotic features ([place]) 04/01/2011  . Hypothyroidism (acquired) 03/22/2011  . Healthcare maintenance 12/28/2010  . BPPV (benign paroxysmal positional vertigo) 11/19/2008  . Hyperlipidemia 11/30/2005  . Essential hypertension 11/30/2005  . LOW BACK PAIN 11/30/2005    Fernie Grimm, PT 09/20/2016, 10:24 PM  [facility] [address] [place], [place], [address] Phone: [phone]   Fax:  [phone]  Name: Kita Neace MRN: [id number] Date of Birth: 1945-04-30

## 2016-09-23 ENCOUNTER — Ambulatory Visit: Payer: Medicare Other | Admitting: Physical Therapy

## 2016-09-23 ENCOUNTER — Encounter: Payer: Self-pay | Admitting: Physical Therapy

## 2016-09-23 DIAGNOSIS — H8111 Benign paroxysmal vertigo, right ear: Secondary | ICD-10-CM | POA: Diagnosis not present

## 2016-09-23 DIAGNOSIS — R2681 Unsteadiness on feet: Secondary | ICD-10-CM | POA: Diagnosis not present

## 2016-09-23 DIAGNOSIS — R2689 Other abnormalities of gait and mobility: Secondary | ICD-10-CM

## 2016-09-23 NOTE — Therapy (Signed)
[facility] [address] [place], [place], [address] Phone: [phone]   Fax:  [phone]  Physical Therapy Treatment  Patient Details  Name: Doris [name] MRN: [id number] Date of Birth: April 08, 1945 Referring Provider: Larey Dresser, MD  Encounter Date: 09/23/2016      PT End of Session - 09/23/16 0915    Visit Number 2   Number of Visits 8   Date for PT Re-Evaluation 11/19/16   Authorization Type Medicare/ mediciad    PT Start Time 0803   PT Stop Time 0850   PT Time Calculation (min) 47 min   Activity Tolerance Patient tolerated treatment well   Behavior During Therapy [facility] for tasks assessed/performed      Past Medical History:  Diagnosis Date  . Acid reflux   . Back pain   . HLD (hyperlipidemia)   . Hypertension   . Insomnia   . Migraine   . Osteoarthritis    knee  . Psychosis    followed by Dr. Keenan Bachelor [phone]  . Thyroid nodule 2010   TSH 04/2008 = 0.296, no follow up noted in [facility]  . Vertigo     Past Surgical History:  Procedure Laterality Date  . TONSILLECTOMY    . UTERINE FIBROID SURGERY      There were no vitals filed for this visit.      Subjective Assessment - 09/23/16 0806    Subjective Pt reports after evaluation pt reports feeling nauseous and feeling nauseous after performing habituation exercises.   Reports having sciatic pain today.   Pertinent History buldging disc; bilateral knee OA    Patient Stated Goals "tired of falling" "tired of walking like a drunkard"; get rid of bad vertigo feeling/nausea.   Currently in Pain? Yes   Pain Score 5    Pain Location Buttocks   Pain Orientation Right   Pain Descriptors / Indicators Discomfort   Pain Type Acute pain  sciatic pain                Vestibular Assessment - 09/23/16 0808      Positional Testing   Dix-Hallpike Dix-Hallpike Right;Dix-Hallpike Left     Dix-Hallpike Right   Dix-Hallpike Right Duration  0   Dix-Hallpike Right Symptoms No nystagmus     Dix-Hallpike Left   Dix-Hallpike Left Duration 10-12 seconds   Dix-Hallpike Left Symptoms Downbeat, left rotatory nystagmus     Horizontal Canal Right   Horizontal Canal Right Duration >30 seconds   Horizontal Canal Right Symptoms Ageotrophic  pt unable to report less symptomatic side     Horizontal Canal Left   Horizontal Canal Left Duration >30 seconds   Horizontal Canal Left Symptoms Ageotrophic                  Vestibular Treatment/Exercise - 09/23/16 0846      Vestibular Treatment/Exercise   Vestibular Treatment Provided Canalith Repositioning;Habituation   Canalith Repositioning Comment  Modified Canal Roll R-see other     OTHER   Comment Upon re-assessment no nystagmus seen with roll to L but roll to R pt exhibited mild ageotropic nystagmus; pt provided with repeated rolling habituation               PT Education - 09/23/16 0914    Education provided Yes   Education Details use of diagram to explain multi-canal BPPV and maneuvers.  Added repeated rolling to habituation HEP   Person(s) Educated Patient   Methods Explanation;Demonstration;Handout   Comprehension Verbalized  understanding          PT Short Term Goals - 07/20/16 1038      PT SHORT TERM GOAL #1   Title Patient will increase passive right ankel dorsi flexion to 8 degrees    Baseline 6   Time 4   Period Weeks   Status On-going     PT SHORT TERM GOAL #2   Title Patient will demsotrate 5/5 gross right    Baseline 4+/5, DF, INV, EV   Time 4   Period Weeks   Status On-going     PT SHORT TERM GOAL #3   Title Patient will be independent with initial HEP for stretching and strengthening    Time 4   Period Weeks   Status Achieved           PT Long Term Goals - 09/20/16 1441      PT LONG TERM GOAL #1   Title The patient will be indep with HEP for habituation and balance activities as indicated.   Time 4   Period Weeks    Target Date 11/19/16  8 week target date due to patient travelling x next 4 weeks     PT LONG TERM GOAL #2   Title The patient will reduce DHI from 76% to < or equal to 55% to demo dec'd self perception of dizziness.   Time 4   Period Weeks   Target Date 11/19/16     PT LONG TERM GOAL #3   Title The patient will have negative positional testing indicating resolution of R BPPV.   Time 4   Period Weeks   Target Date 11/19/16     PT LONG TERM GOAL #4   Title The patient will be further assessed for functional balance (Berg, FGA, etc) and goal to follow, as appropriate.   Time 4   Period Weeks   Target Date 11/19/16               Plan - 09/23/16 0916    Clinical Impression Statement Treatment session focused on reassessment of all peripheral canals.  Pt continued to present with downbeating nystagmus in L hallpike-dix but no vertigo and no rotary nystagmus noted in R hallpike-dix indicating R posterior canal canalithiasis has cleared.  Performed roll test with pt presenting with persistent ageotropic nystagmus with roll to L and to R.  Pt unable to indicate less symptomatic side-pt feels that R side is most affected.  Performed modified R canal roll (combination of Semont + roll) for horizontal canal cupulolithiasis.  During re-assessment pt demonstrated no nystagmus with L roll but + ageotropic with R roll.  Provided pt with repeated rolling for time out of town; will re-assess when pt returns.     Rehab Potential Good   PT Frequency 2x / week  + evaluation   PT Duration 4 weeks   PT Treatment/Interventions ADLs/Self Care Home Management;Canalith Repostioning;Therapeutic activities;Therapeutic exercise;Balance training;Neuromuscular re-education;Patient/family education;Gait training;Vestibular;Functional mobility training   PT Next Visit Plan Recheck R Hallpike for R post BPPV, and horizontal canals for ageotropic cupulolithiasis and treat as indicated.  Assess if pt continues to  have down beating nystagmus (ant canal?).  Assess balance (Berg, FGA), provide HEP for balance   Consulted and Agree with Plan of Care Patient      Patient will benefit from skilled therapeutic intervention in order to improve the following deficits and impairments:  Abnormal gait, Decreased activity tolerance, Dizziness, Decreased mobility, Decreased balance, Decreased safety awareness  Visit Diagnosis: BPPV (benign paroxysmal positional vertigo), right  Other abnormalities of gait and mobility  Unsteadiness on feet     Problem List Patient Active Problem List   Diagnosis Date Noted  . Muscle spasms of neck 08/26/2016  . Abnormality of gait 08/10/2016  . Osteoporosis 10/13/2015  . Acrochordon 07/07/2015  . Jaw pain 12/30/2014  . Coronary artery calcification 12/23/2014  . Tachycardia 08/27/2014  . Cholelithiasis 05/17/2014  . GERD (gastroesophageal reflux disease) 01/02/2013  . TB lung, latent 05/01/2012  . Osteoarthritis 03/01/2012  . Severe major depression with psychotic features ([place]) 04/01/2011  . Hypothyroidism (acquired) 03/22/2011  . Healthcare maintenance 12/28/2010  . BPPV (benign paroxysmal positional vertigo) 11/19/2008  . Hyperlipidemia 11/30/2005  . Essential hypertension 11/30/2005  . LOW BACK PAIN 11/30/2005    Raylene Everts, PT, DPT 09/23/16    9:23 AM    [facility] [address] [place], [place], [address] Phone: [phone]   Fax:  [phone]  Name: Rowan Pollman MRN: [id number] Date of Birth: 19-Jun-1945

## 2016-09-23 NOTE — Patient Instructions (Signed)
Habituation - Tip Card  1.The goal of habituation training is to assist in decreasing symptoms of vertigo, dizziness, or nausea provoked by specific head and body motions. 2.These exercises may initially increase symptoms; however, be persistent and work through symptoms. With repetition and time, the exercises will assist in reducing or eliminating symptoms. 3.Exercises should be stopped and discussed with the therapist if you experience any of the following: - Sudden change or fluctuation in hearing - New onset of ringing in the ears, or increase in current intensity - Any fluid discharge from the ear - Severe pain in neck or back - Extreme nausea  Copyright  VHI. All rights reserved.   Habituation - Rolling   With pillow under head, start on back. Roll to your right side.  Hold until dizziness stops, plus 20 seconds and then roll to the left side.  Hold until dizziness stops, plus 20 seconds.  Repeat sequence 5 times per session. Do 2 sessions per day.  Copyright  VHI. All rights reserved.          Habituation - Sit to Side-Lying   Sit on edge of bed. Lie down onto the right side and hold until dizziness stops, plus 20 seconds.  Return to sitting and wait until dizziness stops, plus 20 seconds.  Repeat to the left side. Repeat sequence 5 times per session. Do 2 sessions per day.  Copyright  VHI. All rights reserved.     

## 2016-10-25 ENCOUNTER — Ambulatory Visit: Payer: Medicare Other | Attending: Sports Medicine | Admitting: Rehabilitative and Restorative Service Providers"

## 2016-10-25 DIAGNOSIS — R2689 Other abnormalities of gait and mobility: Secondary | ICD-10-CM | POA: Diagnosis not present

## 2016-10-25 DIAGNOSIS — H8111 Benign paroxysmal vertigo, right ear: Secondary | ICD-10-CM | POA: Diagnosis not present

## 2016-10-25 DIAGNOSIS — R2681 Unsteadiness on feet: Secondary | ICD-10-CM | POA: Diagnosis not present

## 2016-10-25 NOTE — Therapy (Signed)
[facility] [address] [place], [place], [address] Phone: [phone]   Fax:  [phone]  Physical Therapy Treatment  Patient Details  Name: Doris [name] MRN: [id number] Date of Birth: Mar 11, 1945 Referring Provider: Larey Dresser, MD  Encounter Date: 10/25/2016      PT End of Session - 10/25/16 1446    Visit Number 3   Number of Visits 8   Date for PT Re-Evaluation 11/19/16   Authorization Type Medicare/ mediciad    PT Start Time 1408   PT Stop Time 1450   PT Time Calculation (min) 42 min   Activity Tolerance Patient tolerated treatment well   Behavior During Therapy [facility] for tasks assessed/performed      Past Medical History:  Diagnosis Date  . Acid reflux   . Back pain   . HLD (hyperlipidemia)   . Hypertension   . Insomnia   . Migraine   . Osteoarthritis    knee  . Psychosis    followed by Dr. Keenan Bachelor [phone]  . Thyroid nodule 2010   TSH 04/2008 = 0.296, no follow up noted in [facility]  . Vertigo     Past Surgical History:  Procedure Laterality Date  . TONSILLECTOMY    . UTERINE FIBROID SURGERY      There were no vitals filed for this visit.      Subjective Assessment - 10/25/16 1412    Subjective The patient notes that she has been out of town for the past month and just returned.  She noted dizziness with rolling in bed, was nervous about walking up/down stairs.    Pertinent History buldging disc; bilateral knee OA    Patient Stated Goals "tired of falling" "tired of walking like a drunkard"; get rid of bad vertigo feeling/nausea.   Currently in Pain? Yes  constant pain in sciatic nerve--PT to monitor, no goal to follow.             [facility] PT Assessment - 10/25/16 1428      Standardized Balance Assessment   Standardized Balance Assessment Berg Balance Test     Berg Balance Test   Sit to Stand Able to stand without using hands and stabilize independently   Standing  Unsupported Able to stand safely 2 minutes   Sitting with Back Unsupported but Feet Supported on Floor or Stool Able to sit safely and securely 2 minutes   Stand to Sit Sits safely with minimal use of hands   Transfers Able to transfer safely, minor use of hands   Standing Unsupported with Eyes Closed Able to stand 10 seconds with supervision   Standing Ubsupported with Feet Together Able to place feet together independently and stand 1 minute safely   From Standing, Reach Forward with Outstretched Arm Can reach confidently >25 cm (10")   From Standing Position, Pick up Object from Floor Able to pick up shoe safely and easily   From Standing Position, Turn to Look Behind Over each Shoulder Looks behind from both sides and weight shifts well   Turn 360 Degrees Able to turn 360 degrees safely in 4 seconds or less   Standing Unsupported, Alternately Place Feet on Step/Stool Able to stand independently and safely and complete 8 steps in 20 seconds   Standing Unsupported, One Foot in Front Able to take small step independently and hold 30 seconds   Standing on One Leg Tries to lift leg/unable to hold 3 seconds but remains standing independently   Total Score  50     Functional Gait  Assessment   Gait assessed  Yes   Gait Level Surface Walks 20 ft in less than 7 sec but greater than 5.5 sec, uses assistive device, slower speed, mild gait deviations, or deviates 6-10 in outside of the 12 in walkway width.   Change in Gait Speed Able to smoothly change walking speed without loss of balance or gait deviation. Deviate no more than 6 in outside of the 12 in walkway width.   Gait with Horizontal Head Turns Performs head turns with moderate changes in gait velocity, slows down, deviates 10-15 in outside 12 in walkway width but recovers, can continue to walk.   Gait with Vertical Head Turns Performs task with slight change in gait velocity (eg, minor disruption to smooth gait path), deviates 6 - 10 in outside  12 in walkway width or uses assistive device   Gait and Pivot Turn Pivot turns safely within 3 sec and stops quickly with no loss of balance.   Step Over Obstacle Is able to step over 2 stacked shoe boxes taped together (9 in total height) without changing gait speed. No evidence of imbalance.   Gait with Narrow Base of Support Ambulates 4-7 steps.   Gait with Eyes Closed Cannot walk 20 ft without assistance, severe gait deviations or imbalance, deviates greater than 15 in outside 12 in walkway width or will not attempt task.   Ambulating Backwards Walks 20 ft, uses assistive device, slower speed, mild gait deviations, deviates 6-10 in outside 12 in walkway width.   Steps Alternating feet, must use rail.   Total Score 19   FGA comment: 19/30            Vestibular Assessment - 10/25/16 1414      Vestibular Assessment   General Observation Patient walks into clinic independently.     Positional Testing   Dix-Hallpike Dix-Hallpike Right;Dix-Hallpike Left   Horizontal Canal Testing Horizontal Canal Right;Horizontal Canal Left     Dix-Hallpike Right   Dix-Hallpike Right Duration Large amplitude nystagmus visible in room light lasting approximately 20 seconds.     Dix-Hallpike Right Symptoms Upbeat, right rotatory nystagmus     Dix-Hallpike Left   Dix-Hallpike Left Duration mild x 10 seconds   Dix-Hallpike Left Symptoms Downbeat, left rotatory nystagmus     Horizontal Canal Right   Horizontal Canal Right Duration none   Horizontal Canal Right Symptoms Normal     Horizontal Canal Left   Horizontal Canal Left Duration none   Horizontal Canal Left Symptoms Normal                 [facility] Adult PT Treatment/Exercise - 10/25/16 1507      Self-Care   Self-Care Other Self-Care Comments   Other Self-Care Comments  Discussed home walking, recommending return to prior activity.  Patient works out 2x/week at [facility] [employer] center and plans to return to that program as well.     Neuro  Re-ed    Neuro Re-ed Details  Corner balance exercises performing tandem with eyes open and then single limb stance with one UE support.         Vestibular Treatment/Exercise - 10/25/16 1425      Vestibular Treatment/Exercise   Vestibular Treatment Provided Canalith Repositioning   Canalith Repositioning Epley Manuever Right      EPLEY MANUEVER RIGHT   Number of Reps  2   Response Details  2nd rep of Epley's noted no dizziness and no nystagmus viewed  in room light.     OTHER   Comment With L (2nd rep of Epley's, noted a geotropic (L Beating) nystagmus.               PT Education - 10/25/16 1451    Education provided Yes   Education Details HEP: tandem stance, single leg stance.   Person(s) Educated Patient   Methods Explanation;Demonstration;Handout   Comprehension Verbalized understanding;Returned demonstration          PT Short Term Goals - 10/25/16 1513      PT SHORT TERM GOAL #1   Title STGs=LTGs           PT Long Term Goals - 10/25/16 1513      PT LONG TERM GOAL #1   Title The patient will be indep with HEP for habituation and balance activities as indicated.   Time 4   Period Weeks     PT LONG TERM GOAL #2   Title The patient will reduce DHI from 76% to < or equal to 55% to demo dec'd self perception of dizziness.   Time 4   Period Weeks     PT LONG TERM GOAL #3   Title The patient will have negative positional testing indicating resolution of R BPPV.   Time 4   Period Weeks     PT LONG TERM GOAL #4   Title The patient will be further assessed for functional balance (Berg, FGA, etc) and goal to follow, as appropriate.   Time 4   Period Weeks   Status Achieved     PT LONG TERM GOAL #5   Title The patient will improve FGA from 19/30 up to 24/30 to demo dec'd risk for falls.   Time 4   Period Weeks   Status New   Target Date 11/19/16               Plan - 10/25/16 1514    Clinical Impression Statement The patient continues  with R BPPV today that resolved with one repetition of Epley's maneuver.  PT to continue assessing positional vertigo as she may have multi-canal based on varying nystagmus.    Patient scores 50/56 on Berg and 19/30 on FGA.  PT to address balance deficits.     PT Treatment/Interventions ADLs/Self Care Home Management;Canalith Repostioning;Therapeutic activities;Therapeutic exercise;Balance training;Neuromuscular re-education;Patient/family education;Gait training;Vestibular;Functional mobility training   PT Next Visit Plan Assess positional vertigo and treat.  High level balance/dynamic gait HEP.     Consulted and Agree with Plan of Care Patient      Patient will benefit from skilled therapeutic intervention in order to improve the following deficits and impairments:  Abnormal gait, Decreased activity tolerance, Dizziness, Decreased mobility, Decreased balance, Decreased safety awareness  Visit Diagnosis: BPPV (benign paroxysmal positional vertigo), right  Other abnormalities of gait and mobility  Unsteadiness on feet     Problem List Patient Active Problem List   Diagnosis Date Noted  . Muscle spasms of neck 08/26/2016  . Abnormality of gait 08/10/2016  . Osteoporosis 10/13/2015  . Acrochordon 07/07/2015  . Jaw pain 12/30/2014  . Coronary artery calcification 12/23/2014  . Tachycardia 08/27/2014  . Cholelithiasis 05/17/2014  . GERD (gastroesophageal reflux disease) 01/02/2013  . TB lung, latent 05/01/2012  . Osteoarthritis 03/01/2012  . Severe major depression with psychotic features ([place]) 04/01/2011  . Hypothyroidism (acquired) 03/22/2011  . Healthcare maintenance 12/28/2010  . BPPV (benign paroxysmal positional vertigo) 11/19/2008  . Hyperlipidemia 11/30/2005  . Essential hypertension 11/30/2005  .  LOW BACK PAIN 11/30/2005    Garner Dullea, PT 10/25/2016, 3:18 PM  [facility] [address] [place],  [place], [address] Phone: [phone]   Fax:  [phone]  Name: Doris [name] MRN: [id number] Date of Birth: [birth year]

## 2016-10-25 NOTE — Patient Instructions (Signed)
Feet Heel-Toe "Tandem", Varied Arm Positions - Eyes Open    With eyes open, right foot directly in front of the other, arms out, look straight ahead at a stationary object. Hold __30__ seconds. Repeat _3___ times per session. Do __2__ sessions per day.  Copyright  VHI. All rights reserved.   Single Leg - Eyes Open    Holding support, lift right leg while maintaining balance over other leg. Progress to removing hands from support surface for longer periods of time. Hold__10 __ seconds. Repeat __3__ times per session. Do __2__ sessions per day.  Copyright  VHI. All rights reserved.

## 2016-10-27 DIAGNOSIS — K2900 Acute gastritis without bleeding: Secondary | ICD-10-CM | POA: Diagnosis not present

## 2016-10-27 DIAGNOSIS — E663 Overweight: Secondary | ICD-10-CM | POA: Insufficient documentation

## 2016-10-27 DIAGNOSIS — H8111 Benign paroxysmal vertigo, right ear: Secondary | ICD-10-CM | POA: Diagnosis not present

## 2016-10-27 DIAGNOSIS — M1712 Unilateral primary osteoarthritis, left knee: Secondary | ICD-10-CM | POA: Diagnosis not present

## 2016-10-27 DIAGNOSIS — E782 Mixed hyperlipidemia: Secondary | ICD-10-CM | POA: Diagnosis not present

## 2016-10-27 DIAGNOSIS — E890 Postprocedural hypothyroidism: Secondary | ICD-10-CM | POA: Insufficient documentation

## 2016-10-27 DIAGNOSIS — I10 Essential (primary) hypertension: Secondary | ICD-10-CM | POA: Diagnosis not present

## 2016-10-27 DIAGNOSIS — R7303 Prediabetes: Secondary | ICD-10-CM | POA: Insufficient documentation

## 2016-10-29 ENCOUNTER — Ambulatory Visit: Payer: Medicare Other | Admitting: Rehabilitative and Restorative Service Providers"

## 2016-11-08 ENCOUNTER — Ambulatory Visit: Payer: Medicare Other | Admitting: Rehabilitative and Restorative Service Providers"

## 2016-11-08 DIAGNOSIS — R2689 Other abnormalities of gait and mobility: Secondary | ICD-10-CM

## 2016-11-08 DIAGNOSIS — R2681 Unsteadiness on feet: Secondary | ICD-10-CM | POA: Diagnosis not present

## 2016-11-08 DIAGNOSIS — H8111 Benign paroxysmal vertigo, right ear: Secondary | ICD-10-CM | POA: Diagnosis not present

## 2016-11-08 NOTE — Therapy (Signed)
[facility] [address] [place], [place], [address] Phone: [phone]   Fax:  [phone]  Physical Therapy Treatment  Patient Details  Name: Doris [name] MRN: [id number] Date of Birth: 1945-09-07 Referring Provider: Larey Dresser, MD  Encounter Date: 11/08/2016      PT End of Session - 11/08/16 1516    Visit Number 4   Number of Visits 8   Date for PT Re-Evaluation 11/19/16   Authorization Type Medicare/ mediciad    PT Start Time 1230   PT Stop Time 1310   PT Time Calculation (min) 40 min   Activity Tolerance Patient tolerated treatment well   Behavior During Therapy [facility] for tasks assessed/performed      Past Medical History:  Diagnosis Date  . Acid reflux   . Back pain   . HLD (hyperlipidemia)   . Hypertension   . Insomnia   . Migraine   . Osteoarthritis    knee  . Psychosis    followed by Dr. Keenan Bachelor [phone]  . Thyroid nodule 2010   TSH 04/2008 = 0.296, no follow up noted in [facility]  . Vertigo     Past Surgical History:  Procedure Laterality Date  . TONSILLECTOMY    . UTERINE FIBROID SURGERY      There were no vitals filed for this visit.      Subjective Assessment - 11/08/16 1235    Subjective The patient reports she reached over to check thermostat 2 days ago and fell.  She had a severe episode of vertigo after the fall.  She has not been dizzy yet today..   She tries to do the exercises, but can't do them every day.   Patient Stated Goals "tired of falling" "tired of walking like a drunkard"; get rid of bad vertigo feeling/nausea.   Currently in Pain? No/denies            [facility] PT Assessment - 11/08/16 0001      Berg Balance Test   Sit to Stand Able to stand without using hands and stabilize independently   Standing Unsupported Able to stand safely 2 minutes   Sitting with Back Unsupported but Feet Supported on Floor or Stool Able to sit safely and securely 2 minutes    Stand to Sit Sits safely with minimal use of hands   Transfers Able to transfer safely, minor use of hands   Standing Unsupported with Eyes Closed Able to stand 10 seconds safely   Standing Ubsupported with Feet Together Able to place feet together independently and stand 1 minute safely   From Standing, Reach Forward with Outstretched Arm Can reach confidently >25 cm (10")   From Standing Position, Pick up Object from Floor Able to pick up shoe safely and easily   From Standing Position, Turn to Look Behind Over each Shoulder Looks behind from both sides and weight shifts well   Turn 360 Degrees Able to turn 360 degrees safely in 4 seconds or less   Standing Unsupported, Alternately Place Feet on Step/Stool Able to stand independently and safely and complete 8 steps in 20 seconds   Standing Unsupported, One Foot in Front Able to plae foot ahead of the other independently and hold 30 seconds   Standing on One Leg Able to lift leg independently and hold equal to or more than 3 seconds   Total Score 53   Berg comment: improved from 50/56     Functional Gait  Assessment   Gait  assessed  Yes   Gait Level Surface Walks 20 ft in less than 5.5 sec, no assistive devices, good speed, no evidence for imbalance, normal gait pattern, deviates no more than 6 in outside of the 12 in walkway width.   Change in Gait Speed Able to smoothly change walking speed without loss of balance or gait deviation. Deviate no more than 6 in outside of the 12 in walkway width.   Gait with Horizontal Head Turns Performs head turns smoothly with slight change in gait velocity (eg, minor disruption to smooth gait path), deviates 6-10 in outside 12 in walkway width, or uses an assistive device.   Gait with Vertical Head Turns Performs task with slight change in gait velocity (eg, minor disruption to smooth gait path), deviates 6 - 10 in outside 12 in walkway width or uses assistive device   Gait and Pivot Turn Pivot turns  safely within 3 sec and stops quickly with no loss of balance.   Step Over Obstacle Is able to step over 2 stacked shoe boxes taped together (9 in total height) without changing gait speed. No evidence of imbalance.   Gait with Narrow Base of Support Ambulates 4-7 steps.   Gait with Eyes Closed Walks 20 ft, slow speed, abnormal gait pattern, evidence for imbalance, deviates 10-15 in outside 12 in walkway width. Requires more than 9 sec to ambulate 20 ft.   Ambulating Backwards Walks 20 ft, uses assistive device, slower speed, mild gait deviations, deviates 6-10 in outside 12 in walkway width.   Steps Alternating feet, must use rail.   Total Score 22   FGA comment: 22/30            Vestibular Assessment - 11/08/16 1232      Vestibular Assessment   General Observation Patient walks into clinic independently.       Positional Testing   Dix-Hallpike Dix-Hallpike Right;Dix-Hallpike Left   Horizontal Canal Testing Horizontal Canal Right;Horizontal Canal Left     Dix-Hallpike Right   Dix-Hallpike Right Duration 0   Dix-Hallpike Right Symptoms No nystagmus     Dix-Hallpike Left   Dix-Hallpike Left Duration 0   Dix-Hallpike Left Symptoms No nystagmus     Horizontal Canal Right   Horizontal Canal Right Duration 0   Horizontal Canal Right Symptoms Normal     Horizontal Canal Left   Horizontal Canal Left Duration 0   Horizontal Canal Left Symptoms Normal                 [facility] Adult PT Treatment/Exercise - 11/08/16 1257      Self-Care   Self-Care Other Self-Care Comments   Other Self-Care Comments  fall prevention discussing use of night lights when up at night and reducing clutter.  Discussed falls and patient feels that clutter in her home contributes.  She reports that she recently changed MD offices and requests new information to be sent to Dr. Maia Petties     Neuro Re-ed    Neuro Re-ed Details  Corner balance exercises reviewed for home including tandem stand and single  limb stance near countertop.                 PT Education - 11/08/16 1510    Education provided Yes   Education Details fall prevention in the home; continuation of balance exercises at home   Person(s) Educated Patient   Methods Explanation   Comprehension Verbalized understanding          PT Short Term  Goals - 10/25/16 1513      PT SHORT TERM GOAL #1   Title STGs=LTGs           PT Long Term Goals - 11/08/16 1242      PT LONG TERM GOAL #1   Title The patient will be indep with HEP for habituation and balance activities as indicated.   Time 4   Period Weeks   Status Achieved     PT LONG TERM GOAL #2   Title The patient will reduce DHI from 76% to < or equal to 55% to demo dec'd self perception of dizziness.   Time 4   Period Weeks     PT LONG TERM GOAL #3   Title The patient will have negative positional testing indicating resolution of R BPPV.   Baseline Met on 11/08/2016   Time 4   Period Weeks   Status Achieved     PT LONG TERM GOAL #4   Title The patient will be further assessed for functional balance (Berg, FGA, etc) and goal to follow, as appropriate.   Time 4   Period Weeks   Status Achieved     PT LONG TERM GOAL #5   Title The patient will improve FGA from 19/30 up to 24/30 to demo dec'd risk for falls.   Baseline Improved to 23/30 on 11/08/16   Time 4   Period Weeks   Status On-going               Plan - 11/08/16 1515    Clinical Impression Statement The patient has met LTG 1, 3, 4.  PT to continue working on dynamic gait activities and plans to f/u one more visit to ensure vertigo remains resolved, HEP for balance going well and falls are reduced in #.    PT Treatment/Interventions ADLs/Self Care Home Management;Canalith Repostioning;Therapeutic activities;Therapeutic exercise;Balance training;Neuromuscular re-education;Patient/family education;Gait training;Vestibular;Functional mobility training   PT Next Visit Plan Discuss  vertigo, check LTGs, reassess FGA, progression of HEP   Consulted and Agree with Plan of Care Patient      Patient will benefit from skilled therapeutic intervention in order to improve the following deficits and impairments:  Abnormal gait, Decreased activity tolerance, Dizziness, Decreased mobility, Decreased balance, Decreased safety awareness  Visit Diagnosis: BPPV (benign paroxysmal positional vertigo), right  Other abnormalities of gait and mobility  Unsteadiness on feet     Problem List Patient Active Problem List   Diagnosis Date Noted  . Muscle spasms of neck 08/26/2016  . Abnormality of gait 08/10/2016  . Osteoporosis 10/13/2015  . Acrochordon 07/07/2015  . Jaw pain 12/30/2014  . Coronary artery calcification 12/23/2014  . Tachycardia 08/27/2014  . Cholelithiasis 05/17/2014  . GERD (gastroesophageal reflux disease) 01/02/2013  . TB lung, latent 05/01/2012  . Osteoarthritis 03/01/2012  . Severe major depression with psychotic features ([place]) 04/01/2011  . Hypothyroidism (acquired) 03/22/2011  . Healthcare maintenance 12/28/2010  . BPPV (benign paroxysmal positional vertigo) 11/19/2008  . Hyperlipidemia 11/30/2005  . Essential hypertension 11/30/2005  . LOW BACK PAIN 11/30/2005    Aliea Bobe, PT 11/08/2016, 3:16 PM  [facility] [address] [place], [place], [address] Phone: [phone]   Fax:  [phone]  Name: Doris [name] MRN: [id number] Date of Birth: 1945/05/23

## 2016-11-08 NOTE — Patient Instructions (Signed)

## 2016-11-10 ENCOUNTER — Ambulatory Visit: Payer: Medicare Other | Admitting: Rehabilitative and Restorative Service Providers"

## 2016-11-11 ENCOUNTER — Ambulatory Visit: Payer: Medicare Other | Admitting: Rehabilitative and Restorative Service Providers"

## 2016-11-29 ENCOUNTER — Ambulatory Visit
Admission: RE | Admit: 2016-11-29 | Discharge: 2016-11-29 | Disposition: A | Discharge status: Home / Self Care | Payer: Medicare Other | Source: Ambulatory Visit | Attending: Internal Medicine | Admitting: Internal Medicine

## 2016-11-29 ENCOUNTER — Other Ambulatory Visit: Payer: Self-pay | Admitting: Internal Medicine

## 2016-11-29 DIAGNOSIS — R0609 Other forms of dyspnea: Secondary | ICD-10-CM | POA: Diagnosis not present

## 2016-11-29 DIAGNOSIS — R002 Palpitations: Secondary | ICD-10-CM | POA: Diagnosis not present

## 2016-11-29 DIAGNOSIS — K219 Gastro-esophageal reflux disease without esophagitis: Secondary | ICD-10-CM | POA: Diagnosis not present

## 2016-11-29 DIAGNOSIS — R0602 Shortness of breath: Secondary | ICD-10-CM

## 2016-11-29 DIAGNOSIS — M810 Age-related osteoporosis without current pathological fracture: Secondary | ICD-10-CM | POA: Diagnosis not present

## 2016-12-01 ENCOUNTER — Encounter: Payer: Medicare Other | Admitting: Rehabilitative and Restorative Service Providers"

## 2016-12-06 DIAGNOSIS — E559 Vitamin D deficiency, unspecified: Secondary | ICD-10-CM | POA: Diagnosis not present

## 2016-12-06 DIAGNOSIS — R0609 Other forms of dyspnea: Secondary | ICD-10-CM | POA: Diagnosis not present

## 2016-12-06 DIAGNOSIS — R000 Tachycardia, unspecified: Secondary | ICD-10-CM | POA: Diagnosis not present

## 2016-12-07 DIAGNOSIS — F331 Major depressive disorder, recurrent, moderate: Secondary | ICD-10-CM | POA: Diagnosis not present

## 2016-12-07 DIAGNOSIS — F411 Generalized anxiety disorder: Secondary | ICD-10-CM | POA: Diagnosis not present

## 2016-12-08 ENCOUNTER — Encounter: Payer: Self-pay | Admitting: Rehabilitative and Restorative Service Providers"

## 2016-12-08 DIAGNOSIS — E559 Vitamin D deficiency, unspecified: Secondary | ICD-10-CM | POA: Insufficient documentation

## 2016-12-08 NOTE — Therapy (Signed)
[facility] [address] [place], [place], [address] Phone: [phone]   Fax:  [phone]  Patient Details  Name: Doris [name] MRN: [id number] Date of Birth: 1946/02/07 Referring Provider:  No ref. provider found  Encounter Date: last encounter 11/08/16  PHYSICAL THERAPY DISCHARGE SUMMARY  Visits from Start of Care: 4  Current functional level related to goals / functional outcomes:     PT Long Term Goals - 11/08/16 1242      PT LONG TERM GOAL #1   Title The patient will be indep with HEP for habituation and balance activities as indicated.   Time 4   Period Weeks   Status Achieved     PT LONG TERM GOAL #2   Title The patient will reduce DHI from 76% to < or equal to 55% to demo dec'd self perception of dizziness.   Time 4   Period Weeks     PT LONG TERM GOAL #3   Title The patient will have negative positional testing indicating resolution of R BPPV.   Baseline Met on 11/08/2016   Time 4   Period Weeks   Status Achieved     PT LONG TERM GOAL #4   Title The patient will be further assessed for functional balance (Berg, FGA, etc) and goal to follow, as appropriate.   Time 4   Period Weeks   Status Achieved     PT LONG TERM GOAL #5   Title The patient will improve FGA from 19/30 up to 24/30 to demo dec'd risk for falls.   Baseline Improved to 23/30 on 11/08/16   Time 4   Period Weeks   Status On-going        Remaining deficits: The patient was scheduled for one further f/u 12/01/16 for PT and cancelled remaining visit.  See note from last session for patient status.   Education / Equipment: Home program, community wellness.  Plan: Patient agrees to discharge.  Patient goals were partially met. Patient is being discharged due to meeting the stated rehab goals.  ?????         Thank you for the referral of this patient. Rudell Cobb, MPT   Peytan Andringa 12/08/2016, 11:56  AM  [facility] [address] [place], [place], [address] Phone: [phone]   Fax:  [phone]

## 2016-12-14 DIAGNOSIS — M8589 Other specified disorders of bone density and structure, multiple sites: Secondary | ICD-10-CM | POA: Diagnosis not present

## 2016-12-14 DIAGNOSIS — Z1231 Encounter for screening mammogram for malignant neoplasm of breast: Secondary | ICD-10-CM | POA: Diagnosis not present

## 2016-12-22 DIAGNOSIS — Z23 Encounter for immunization: Secondary | ICD-10-CM | POA: Diagnosis not present

## 2017-03-07 DIAGNOSIS — F411 Generalized anxiety disorder: Secondary | ICD-10-CM | POA: Diagnosis not present

## 2017-03-07 DIAGNOSIS — F331 Major depressive disorder, recurrent, moderate: Secondary | ICD-10-CM | POA: Diagnosis not present

## 2017-03-22 NOTE — Progress Notes (Signed)
CARDIOLOGY OFFICE NOTE  Date:  03/23/2017    Doris [name] Date of Birth: Jul 23, 1945 Medical Record #[id number]  PCP:  Audley Hose, MD  Cardiologist:  [facility]  Chief Complaint  Patient presents with  . Dizziness    Follow up visit - seen for Dr. Marlou Porch    History of Present Illness: Doris [name] is a 72 y.o. female who presents today for a follow up visit/med refill.  Seen for Dr. Marlou Porch.   She was last seen here in December of 2017.   She has had evaluation for tachycardia in the past - she had had palpitations and SOB for years. She has had hyperthyroidism but has continued to have symptoms despite normal levels. She has had prior CT of the chest showing coronary calcification of the LAD and RCA. She has had negative Myoview and echo in the past. She has been maintained on chronic beta blocker therapy.   Comes in today. Here alone. Had been managed by her PCP. History is a little hard to follow. Has had EKG with PCP - I am not able to see and she does not wish to have repeated. Still with some fluctuation of her heart rate noted - even with activities of daily living. Somewhat aggravating for her. She denies chest pain. She says her thyroid level is ok. Unclear when she has had labs - she does not wish to have any today. She remains fairly active - tells me she is doing aerobics. Reiterates several times that her tachycardia is "aggravating".   Past Medical History:  Diagnosis Date  . Acid reflux   . Back pain   . HLD (hyperlipidemia)   . Hypertension   . Insomnia   . Migraine   . Osteoarthritis    knee  . Psychosis ([place])    followed by Dr. Keenan Bachelor [phone]  . Thyroid nodule 2010   TSH 04/2008 = 0.296, no follow up noted in [facility]  . Vertigo     Past Surgical History:  Procedure Laterality Date  . TONSILLECTOMY    . UTERINE FIBROID SURGERY       Medications: Current Meds  Medication Sig  . ALPRAZolam (XANAX) 0.25 MG tablet Take 0.25 mg by  mouth at bedtime as needed for anxiety.  Marland Kitchen amLODipine (NORVASC) 5 MG tablet TAKE 1 TABLET BY MOUTH EVERY DAY  . Biotin 1000 MCG tablet Take 3,000 mcg by mouth daily.  Marland Kitchen doxepin (SINEQUAN) 25 MG capsule Take 25 mg by mouth daily.  . DULoxetine (CYMBALTA) 30 MG capsule Take by mouth daily.  . hydrOXYzine (VISTARIL) 25 MG capsule TAKE 1 CAPSULE BY MOUTH TWICE DAILY AS NEEDED FOR ANXIETY  . levothyroxine (SYNTHROID, LEVOTHROID) 25 MCG tablet Take 25 mcg by mouth daily before breakfast.  . metoprolol tartrate (LOPRESSOR) 25 MG tablet TAKE 0.5 TABLETS (12.5 MG TOTAL) BY MOUTH 2 (TWO) TIMES DAILY. (Patient taking differently: Take 25 mg by mouth 2 (two) times daily. )  . ranitidine (ZANTAC) 150 MG tablet Take 1 tablet (150 mg total) by mouth daily as needed for heartburn.  . simvastatin (ZOCOR) 40 MG tablet Take 40 mg by mouth every other day.  . [DISCONTINUED] baclofen (LIORESAL) 10 MG tablet Take 1 tablet (10 mg total) by mouth at bedtime as needed for muscle spasms.  . [DISCONTINUED] cyclobenzaprine (FLEXERIL) 5 MG tablet Take 1 tablet (5 mg total) by mouth at bedtime as needed for muscle spasms.  . [DISCONTINUED] DULoxetine (CYMBALTA) 60 MG capsule TAKE ONE  CAPSULE BY MOUTH EVERY EVENING FOR ANXIETY/DEPRESSION  . [DISCONTINUED] meclizine (ANTIVERT) 25 MG tablet   . [DISCONTINUED] quinapril (ACCUPRIL) 10 MG tablet Take 1 tablet (10 mg total) by mouth daily.  . [DISCONTINUED] simvastatin (ZOCOR) 20 MG tablet Take 1 tablet (20 mg total) by mouth daily.     Allergies: Allergies  Allergen Reactions  . Lidocaine     REACTION: seizure  . Olanzapine     REACTION: unknown reaction    Social History: The patient  reports that  has never smoked. she has never used smokeless tobacco. She reports that she does not drink alcohol or use drugs.   Family History: The patient's family history includes Heart disease in her father, mother, and other.   Review of Systems: Please see the history of present  illness.   Otherwise, the review of systems is positive for none.   All other systems are reviewed and negative.   Physical Exam: VS:  BP 130/80 (BP Location: Left Arm, Patient Position: Sitting, Cuff Size: Normal)   Pulse 90   Ht 5\' 4"  (1.626 m)   Wt 160 lb (72.6 kg)   LMP 03/27/1984   SpO2 96% Comment: at rest  BMI 27.46 kg/m  .  BMI Body mass index is 27.46 kg/m.  Wt Readings from Last 3 Encounters:  03/23/17 160 lb (72.6 kg)  08/30/16 159 lb 6.4 oz (72.3 kg)  08/26/16 160 lb 1.6 oz (72.6 kg)    General: Pleasant. Well developed, well nourished and in no acute distress.   HEENT: Normal.  Neck: Supple, no JVD, carotid bruits, or masses noted.  Cardiac: Regular rate and rhythm. No murmurs, rubs, or gallops. No edema.  Respiratory:  Lungs are clear to auscultation bilaterally with normal work of breathing.  GI: Soft and nontender.  MS: No deformity or atrophy. Gait and ROM intact.  Skin: Warm and dry. Color is normal.  Neuro:  Strength and sensation are intact and no gross focal deficits noted.  Psych: Alert, appropriate and with normal affect.   LABORATORY DATA:  EKG:  EKG is not ordered today. She refused.   Lab Results  Component Value Date   WBC 6.0 08/26/2014   HGB 15.0 08/26/2014   HCT 43.9 08/26/2014   PLT 183 08/26/2014   GLUCOSE 88 12/30/2014   CHOL 196 05/17/2014   TRIG 168 (H) 05/17/2014   HDL 68 05/17/2014   LDLCALC 94 05/17/2014   ALT 12 12/09/2011   AST 16 12/09/2011   NA 142 12/30/2014   K 3.8 12/30/2014   CL 106 12/30/2014   CREATININE 0.84 12/30/2014   BUN <5 (L) 12/30/2014   CO2 31 12/30/2014   TSH 0.165 (L) 08/26/2014   INR 1.0 07/03/2007     BNP (last 3 results) No results for input(s): BNP in the last 8760 hours.  ProBNP (last 3 results) No results for input(s): PROBNP in the last 8760 hours.   Other Studies Reviewed Today:  Myoview Study Highlights November 2016   Nuclear stress EF: 63%. Normal LV function  The study is  normal. No evidence of ischemia  This is a low risk study.   Echo Study Conclusions 12/2014 - Left ventricle: The cavity size was normal. Wall thickness was   normal. Systolic function was normal. The estimated ejection   fraction was in the range of 55% to 60%. Normal GLPSS at -18%.   Wall motion was normal; there were no regional wall motion   abnormalities. Doppler parameters are  consistent with abnormal   left ventricular relaxation (grade 1 diastolic dysfunction). The   E/e&' ratio is <8, suggesting normal LV filling pressure. - Aortic valve: Trileaflet. Sclerosis without stenosis. There was   no regurgitation. - Mitral valve: Mildly thickened leaflets . There was mild   regurgitation. - Left atrium: The atrium was normal in size. - Atrial septum: Mobile IAS. No definite PFO. - Inferior vena cava: The vessel was normal in size. The   respirophasic diameter changes were in the normal range (>= 50%),   consistent with normal central venous pressure.  Impressions:  - LVEF 55-60%, normal wall thickness, normal wall motion, diastolic   dysfunction, normal LV filling pressure, aortic valve sclerosis,   mild MR, mobile IAS, normal IVC.   Assessment/Plan:  1. Palpitations/tachycardia - needing her Lopressor refilled according to the record - will increase the dose as well - she has room with HR and BP - I have asked her to monitor BP and HR at home - may be able to reduce her Norvasc or stop. She does not wish to have EKG or labs done with me.   2. Chronic DOE - not really endorsed today.   3. Hyperthyroidism - followed by PCP  4. Coronary calcification - no active chest pain - she has had prior low risk Myoview - would favor CV risk factor modification.   Current medicines are reviewed with the patient today.  The patient does not have concerns regarding medicines other than what has been noted above.  The following changes have been made:  See above.  Labs/ tests ordered  today include:   No orders of the defined types were placed in this encounter.    Disposition:   FU with Dr. Marlou Porch in a few months to recheck.   Patient is agreeable to this plan and will call if any problems develop in the interim.   SignedTruitt Merle, NP  03/23/2017 11:41 AM  [facility] [address] [place], [place]  [address] Phone: [phone] Fax: [phone]

## 2017-03-23 ENCOUNTER — Encounter: Payer: Self-pay | Admitting: Nurse Practitioner

## 2017-03-23 ENCOUNTER — Ambulatory Visit (INDEPENDENT_AMBULATORY_CARE_PROVIDER_SITE_OTHER): Payer: Medicare Other | Admitting: Nurse Practitioner

## 2017-03-23 VITALS — BP 130/80 | HR 90 | Ht 64.0 in | Wt 160.0 lb

## 2017-03-23 DIAGNOSIS — R000 Tachycardia, unspecified: Secondary | ICD-10-CM | POA: Diagnosis not present

## 2017-03-23 DIAGNOSIS — R002 Palpitations: Secondary | ICD-10-CM

## 2017-03-23 MED ORDER — METOPROLOL TARTRATE 50 MG PO TABS: 50.0000 mg | ORAL_TABLET | Freq: Two times a day (BID) | ORAL | 3 refills | Status: DC

## 2017-03-23 NOTE — Patient Instructions (Addendum)
We will be checking the following labs today - NONE   Medication Instructions:    Continue with your current medicines. BUT  I am going to increase the Lopressor to 50 mg to take twice a day - this has been sent to your drug store.     Testing/Procedures To Be Arranged:  N/A  Follow-Up:   See Dr. Marlou Porch in about 3 months    Other Special Instructions:   N/A    If you need a refill on your cardiac medications before your next appointment, please call your pharmacy.   Call the [facility] office at [phone] if you have any questions, problems or concerns.

## 2017-05-16 ENCOUNTER — Encounter: Payer: Self-pay | Admitting: Gastroenterology

## 2017-05-26 DIAGNOSIS — F331 Major depressive disorder, recurrent, moderate: Secondary | ICD-10-CM | POA: Diagnosis not present

## 2017-05-26 DIAGNOSIS — F411 Generalized anxiety disorder: Secondary | ICD-10-CM | POA: Diagnosis not present

## 2017-05-30 DIAGNOSIS — H11121 Conjunctival concretions, right eye: Secondary | ICD-10-CM | POA: Diagnosis not present

## 2017-05-30 DIAGNOSIS — H16223 Keratoconjunctivitis sicca, not specified as Sjogren's, bilateral: Secondary | ICD-10-CM | POA: Diagnosis not present

## 2017-05-30 DIAGNOSIS — H10413 Chronic giant papillary conjunctivitis, bilateral: Secondary | ICD-10-CM | POA: Diagnosis not present

## 2017-05-30 DIAGNOSIS — H1851 Endothelial corneal dystrophy: Secondary | ICD-10-CM | POA: Diagnosis not present

## 2017-05-30 DIAGNOSIS — H2513 Age-related nuclear cataract, bilateral: Secondary | ICD-10-CM | POA: Diagnosis not present

## 2017-06-23 ENCOUNTER — Ambulatory Visit: Payer: Medicare Other | Admitting: Cardiology

## 2017-07-15 DIAGNOSIS — R000 Tachycardia, unspecified: Secondary | ICD-10-CM | POA: Diagnosis not present

## 2017-07-15 DIAGNOSIS — I10 Essential (primary) hypertension: Secondary | ICD-10-CM | POA: Diagnosis not present

## 2017-07-15 DIAGNOSIS — E559 Vitamin D deficiency, unspecified: Secondary | ICD-10-CM | POA: Diagnosis not present

## 2017-07-15 DIAGNOSIS — E890 Postprocedural hypothyroidism: Secondary | ICD-10-CM | POA: Diagnosis not present

## 2017-07-15 DIAGNOSIS — M1712 Unilateral primary osteoarthritis, left knee: Secondary | ICD-10-CM | POA: Diagnosis not present

## 2017-07-19 ENCOUNTER — Ambulatory Visit (INDEPENDENT_AMBULATORY_CARE_PROVIDER_SITE_OTHER): Payer: Medicare Other | Admitting: Cardiology

## 2017-07-19 ENCOUNTER — Encounter: Payer: Self-pay | Admitting: Cardiology

## 2017-07-19 VITALS — BP 120/88 | HR 80 | Ht 64.0 in | Wt 163.6 lb

## 2017-07-19 DIAGNOSIS — R0609 Other forms of dyspnea: Secondary | ICD-10-CM

## 2017-07-19 DIAGNOSIS — R002 Palpitations: Secondary | ICD-10-CM | POA: Diagnosis not present

## 2017-07-19 NOTE — Progress Notes (Signed)
Cardiology Office Note   Date:  07/19/2017   ID:  Doris [name], DOB 11/12/1945, MRN [id number]  PCP:  Audley Hose, MD  Cardiologist:   Candee Furbish, MD       History of Present Illness: Doris [name] is a 72 y.o. female who presents forfollow up  of tachycardia. Prior office note from 11/21/14 reviewed. She had been reporting palpitations and shortness of breath for years. No chest pain or loss of consciousness. No excessive caffeine. Her free T4 is currently normal however she continues to have tachycardia. Stress test years ago was performed.  She is also been noticing shortness of breath with exertional activity. She has stopped exercising because of this. She is noticed this over the last few months. Wonder if this. Correlation with hyperthyroidism. No chest pain. She did have a CT scan of chest which showed coronary calcification of LAD and RCA.  Her tachycardia has been present as well as shortness of breath since February 2016. Overall reassuring workup with nuclear stress test and echocardiogram. Low-dose metoprolol started which has helped a little bit but she still continues to have some increased heart rhythm. We decided to increase her metoprolol to 25 mg twice a day on 01/22/16.  Her thyroid has been checked and has been excellent. Mild lower extremity swelling in the evening hours.  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   07/19/17 - SOB still happening. Metop increased. Have to stop to catch breath.  Continue with metoprolol.  She does do however yoga classes and aerobics.  No chest pain.  Past Medical History:  Diagnosis Date  . Acid reflux   . Back pain   . HLD (hyperlipidemia)   . Hypertension   . Insomnia   . Migraine   . Osteoarthritis    knee  . Psychosis ([place])    followed by Dr. Keenan Bachelor [phone]  . Thyroid nodule 2010   TSH 04/2008 = 0.296, no follow up noted in [facility]  . Vertigo     Past Surgical History:  Procedure Laterality Date  .  TONSILLECTOMY    . UTERINE FIBROID SURGERY       Current Outpatient Medications  Medication Sig Dispense Refill  . ALPRAZolam (XANAX) 0.25 MG tablet Take 0.25 mg by mouth at bedtime as needed for anxiety.    Marland Kitchen amLODipine (NORVASC) 5 MG tablet TAKE 1 TABLET BY MOUTH EVERY DAY 90 tablet 0  . Biotin 1000 MCG tablet Take 3,000 mcg by mouth daily.    Marland Kitchen doxepin (SINEQUAN) 25 MG capsule Take 25 mg by mouth daily.    . DULoxetine (CYMBALTA) 30 MG capsule Take by mouth daily.  0  . levothyroxine (SYNTHROID, LEVOTHROID) 25 MCG tablet Take 25 mcg by mouth daily before breakfast.    . metoprolol tartrate (LOPRESSOR) 50 MG tablet Take 1 tablet (50 mg total) by mouth 2 (two) times daily. 180 tablet 3  . ranitidine (ZANTAC) 150 MG tablet Take 1 tablet (150 mg total) by mouth daily as needed for heartburn. 90 tablet 3  . simvastatin (ZOCOR) 40 MG tablet Take 40 mg by mouth every other day.     No current facility-administered medications for this visit.     Allergies:   Lidocaine and Olanzapine    Social History:  The patient  reports that she has never smoked. She has never used smokeless tobacco. She reports that she does not drink alcohol or use drugs.   Family History:  The patient's family history includes  Heart disease in her father, mother, and other. her mother died of congestive heart failure at a later age.   ROS:  Please see the history of present illness.   All other neg  PHYSICAL EXAM: VS:  BP 120/88   Pulse 80   Ht 5\' 4"  (1.626 m)   Wt 163 lb 9.6 oz (74.2 kg)   LMP 03/27/1984   BMI 28.08 kg/m  , BMI Body mass index is 28.08 kg/m. GEN: Well nourished, well developed, in no acute distress  HEENT: normal  Neck: no JVD, carotid bruits, or masses Cardiac: RRR; no murmurs, rubs, or gallops,no edema  Respiratory:  clear to auscultation bilaterally, normal work of breathing GI: soft, nontender, nondistended, + BS MS: no deformity or atrophy  Skin: warm and dry, no rash Neuro:   Alert and Oriented x 3, Strength and sensation are intact Psych: euthymic mood, full affect    EKG: 07/19/2017-sinus rhythm nonspecific ST-T wave changes personally viewed-prior previous reviewed and normal sinus rhythm, heart rate 87.  Echocardiogram 12/2014-normal EF, aortic valve sclerosis, mild MR.  Nuclear stress test 2016-low risk, no ischemia.  Recent Labs: No results found for requested labs within last 8760 hours.    Lipid Panel    Component Value Date/Time   CHOL 196 05/17/2014 1527   TRIG 168 (H) 05/17/2014 1527   HDL 68 05/17/2014 1527   CHOLHDL 2.9 05/17/2014 1527   VLDL 34 05/17/2014 1527   LDLCALC 94 05/17/2014 1527      Wt Readings from Last 3 Encounters:  07/19/17 163 lb 9.6 oz (74.2 kg)  03/23/17 160 lb (72.6 kg)  08/30/16 159 lb 6.4 oz (72.3 kg)      Other studies Reviewed: Additional studies/ records that were reviewed today include: Prior office notes, lab work, CT scan. Review of the above records demonstrates: none   ASSESSMENT AND PLAN:  Dyspnea on exertion  -Echo and NUC reassuring.  free T4 is now normal.  beta blocker increased. I will increase to 25 mg twice a day. Nuclear stress test low risk. Reassurance has been given.  I will refer her to pulmonary for further evaluation.  Hyperthyroidism status post radioactive iodine ablation  - Dr. Buddy Duty  - free T4 normal, no new changes  Coronary artery calcification  - LAD, RCA  -Continue with secondary risk factor modification, statin  Tachycardia  -Beta-blocker has helped.  Heart rate currently 80  Dependent edema  - Mild swelling at the end of the day. Reassurance.  No new changes.  Current medicines are reviewed at length with the patient today.  The patient does not have concerns regarding medicines.  The following changes have been made:  no change  Labs/ tests ordered today include:   No orders of the defined types were placed in this encounter.    Disposition:   FU with  [facility] in 12 months  Signed, Candee Furbish, MD  07/19/2017 3:24 PM    [facility] [place], [place], [place]  [address] Phone: [phone]; Fax: [phone]

## 2017-07-19 NOTE — Patient Instructions (Signed)
Medication Instructions:  The current medical regimen is effective;  continue present plan and medications.  You are being referred to pulmonary for the evaluation of shortness of breath.  Follow-Up: Follow up in 1 year with Doris Merle, NP.  You will receive a letter in the mail 2 months before you are due.  Please call us when you receive this letter to schedule your follow up appointment.  Thank you for choosing [facility]!!

## 2017-07-20 ENCOUNTER — Encounter: Payer: Self-pay | Admitting: Cardiology

## 2017-08-16 DIAGNOSIS — F331 Major depressive disorder, recurrent, moderate: Secondary | ICD-10-CM | POA: Diagnosis not present

## 2017-08-16 DIAGNOSIS — F411 Generalized anxiety disorder: Secondary | ICD-10-CM | POA: Diagnosis not present

## 2017-08-29 ENCOUNTER — Institutional Professional Consult (permissible substitution): Payer: Medicare Other | Admitting: Pulmonary Disease

## 2017-09-01 DIAGNOSIS — R419 Unspecified symptoms and signs involving cognitive functions and awareness: Secondary | ICD-10-CM | POA: Diagnosis not present

## 2017-11-14 DIAGNOSIS — F411 Generalized anxiety disorder: Secondary | ICD-10-CM | POA: Diagnosis not present

## 2017-11-14 DIAGNOSIS — F331 Major depressive disorder, recurrent, moderate: Secondary | ICD-10-CM | POA: Diagnosis not present

## 2017-11-16 DIAGNOSIS — Z1239 Encounter for other screening for malignant neoplasm of breast: Secondary | ICD-10-CM | POA: Diagnosis not present

## 2017-11-16 DIAGNOSIS — M810 Age-related osteoporosis without current pathological fracture: Secondary | ICD-10-CM | POA: Diagnosis not present

## 2017-11-16 DIAGNOSIS — Z1159 Encounter for screening for other viral diseases: Secondary | ICD-10-CM | POA: Diagnosis not present

## 2017-11-16 DIAGNOSIS — R7303 Prediabetes: Secondary | ICD-10-CM | POA: Diagnosis not present

## 2017-11-16 DIAGNOSIS — N907 Vulvar cyst: Secondary | ICD-10-CM | POA: Diagnosis not present

## 2017-11-16 DIAGNOSIS — K219 Gastro-esophageal reflux disease without esophagitis: Secondary | ICD-10-CM | POA: Diagnosis not present

## 2017-11-16 DIAGNOSIS — M1712 Unilateral primary osteoarthritis, left knee: Secondary | ICD-10-CM | POA: Diagnosis not present

## 2017-11-16 DIAGNOSIS — E782 Mixed hyperlipidemia: Secondary | ICD-10-CM | POA: Diagnosis not present

## 2017-11-16 DIAGNOSIS — E890 Postprocedural hypothyroidism: Secondary | ICD-10-CM | POA: Diagnosis not present

## 2017-11-17 ENCOUNTER — Other Ambulatory Visit: Payer: Self-pay | Admitting: Internal Medicine

## 2017-11-17 DIAGNOSIS — M810 Age-related osteoporosis without current pathological fracture: Secondary | ICD-10-CM

## 2017-11-17 DIAGNOSIS — Z23 Encounter for immunization: Secondary | ICD-10-CM | POA: Diagnosis not present

## 2017-11-17 DIAGNOSIS — Z1231 Encounter for screening mammogram for malignant neoplasm of breast: Secondary | ICD-10-CM

## 2017-11-24 DIAGNOSIS — R42 Dizziness and giddiness: Secondary | ICD-10-CM | POA: Diagnosis not present

## 2017-11-24 DIAGNOSIS — R4189 Other symptoms and signs involving cognitive functions and awareness: Secondary | ICD-10-CM | POA: Diagnosis not present

## 2017-11-24 DIAGNOSIS — R413 Other amnesia: Secondary | ICD-10-CM | POA: Diagnosis not present

## 2017-11-30 DIAGNOSIS — R7303 Prediabetes: Secondary | ICD-10-CM | POA: Diagnosis not present

## 2017-11-30 DIAGNOSIS — R419 Unspecified symptoms and signs involving cognitive functions and awareness: Secondary | ICD-10-CM | POA: Diagnosis not present

## 2017-11-30 DIAGNOSIS — E782 Mixed hyperlipidemia: Secondary | ICD-10-CM | POA: Diagnosis not present

## 2017-11-30 DIAGNOSIS — Z1159 Encounter for screening for other viral diseases: Secondary | ICD-10-CM | POA: Diagnosis not present

## 2017-11-30 DIAGNOSIS — M810 Age-related osteoporosis without current pathological fracture: Secondary | ICD-10-CM | POA: Diagnosis not present

## 2017-12-05 DIAGNOSIS — R1111 Vomiting without nausea: Secondary | ICD-10-CM | POA: Diagnosis not present

## 2017-12-05 DIAGNOSIS — K219 Gastro-esophageal reflux disease without esophagitis: Secondary | ICD-10-CM | POA: Diagnosis not present

## 2017-12-05 DIAGNOSIS — R1310 Dysphagia, unspecified: Secondary | ICD-10-CM | POA: Diagnosis not present

## 2017-12-06 ENCOUNTER — Encounter: Payer: Self-pay | Admitting: *Deleted

## 2017-12-15 DIAGNOSIS — R12 Heartburn: Secondary | ICD-10-CM | POA: Diagnosis not present

## 2017-12-15 DIAGNOSIS — K319 Disease of stomach and duodenum, unspecified: Secondary | ICD-10-CM | POA: Diagnosis not present

## 2017-12-15 DIAGNOSIS — R1310 Dysphagia, unspecified: Secondary | ICD-10-CM | POA: Diagnosis not present

## 2017-12-15 DIAGNOSIS — R1111 Vomiting without nausea: Secondary | ICD-10-CM | POA: Diagnosis not present

## 2017-12-15 DIAGNOSIS — K219 Gastro-esophageal reflux disease without esophagitis: Secondary | ICD-10-CM | POA: Diagnosis not present

## 2017-12-27 DIAGNOSIS — H81399 Other peripheral vertigo, unspecified ear: Secondary | ICD-10-CM | POA: Diagnosis not present

## 2017-12-27 DIAGNOSIS — R419 Unspecified symptoms and signs involving cognitive functions and awareness: Secondary | ICD-10-CM | POA: Diagnosis not present

## 2017-12-28 ENCOUNTER — Encounter: Payer: Self-pay | Admitting: Obstetrics & Gynecology

## 2017-12-28 ENCOUNTER — Ambulatory Visit (INDEPENDENT_AMBULATORY_CARE_PROVIDER_SITE_OTHER): Payer: Medicare Other | Admitting: Obstetrics & Gynecology

## 2017-12-28 ENCOUNTER — Encounter: Payer: Medicare Other | Admitting: Obstetrics & Gynecology

## 2017-12-28 VITALS — BP 150/76 | HR 84 | Ht 64.0 in | Wt 151.5 lb

## 2017-12-28 DIAGNOSIS — R102 Pelvic and perineal pain: Secondary | ICD-10-CM

## 2017-12-28 DIAGNOSIS — Z8262 Family history of osteoporosis: Secondary | ICD-10-CM | POA: Diagnosis not present

## 2017-12-28 DIAGNOSIS — Z1231 Encounter for screening mammogram for malignant neoplasm of breast: Secondary | ICD-10-CM | POA: Diagnosis not present

## 2017-12-28 DIAGNOSIS — M8589 Other specified disorders of bone density and structure, multiple sites: Secondary | ICD-10-CM | POA: Diagnosis not present

## 2017-12-28 NOTE — Progress Notes (Signed)
   GYNECOLOGY OFFICE VISIT NOTE  History:  72 y.o. PMP F here today for evaluation of discomfort during intercourse x 3 weeks. Feels possible left vulvar cyst.  No drainage. She denies any abnormal vaginal discharge, bleeding, pelvic pain or other concerns.   Past Medical History:  Diagnosis Date  . Acid reflux   . Back pain   . HLD (hyperlipidemia)   . Hypertension   . Insomnia   . Migraine   . Osteoarthritis    knee  . Psychosis ([place])    followed by Dr. Keenan Bachelor [phone]  . Thyroid nodule 2010   TSH 04/2008 = 0.296, no follow up noted in [facility]  . Vertigo     Past Surgical History:  Procedure Laterality Date  . TONSILLECTOMY    . UTERINE FIBROID SURGERY      The following portions of the patient's history were reviewed and updated as appropriate: allergies, current medications, past family history, past medical history, past social history, past surgical history and problem list.    Review of Systems:  Pertinent items noted in HPI and remainder of comprehensive ROS otherwise negative.  Objective:  Physical Exam BP (!) 150/76   Pulse 84   Ht 5\' 4"  (1.626 m)   Wt 151 lb 8 oz (68.7 kg)   LMP 03/27/1984   BMI 26.00 kg/m  CONSTITUTIONAL: Well-developed, well-nourished female in no acute distress.  HEENT:  Normocephalic, atraumatic. External right and left ear normal. No scleral icterus.  NECK: Normal range of motion, supple, no masses noted on observation SKIN: Skin is warm and dry. No rash noted. Not diaphoretic. No erythema. No pallor. MUSCULOSKELETAL: Normal range of motion. No edema noted. NEUROLOGIC: Alert and oriented to person, place, and time. Normal muscle tone coordination. No cranial nerve deficit noted. PSYCHIATRIC: Normal mood and affect. Normal behavior. Normal judgment and thought content. CARDIOVASCULAR: Normal heart rate noted RESPIRATORY: Effort and breath sounds normal, no problems with respiration noted ABDOMEN: Soft, no distention noted.     PELVIC: Normal appearing external genitalia with moderate atrophy. Attenuated labia minora. Atrophic distal vaginal mucosa, no enlarged glands/cysts. No vulvar lesions. No pain on examination, also no pain on digital examination of vagina.  Assessment & Plan:  1. Vulvar pain Normal exam findings, patient reassured.  Recommended lots of patience and to use water based or silicone hypoallergenic lubrication during intercourse.  She was advised to call/come in for any worsening symptoms or for other GYN concerns.  Return for any gynecologic concerns.  Total face-to-face time with patient: 20 minutes.  Over 50% of encounter was spent on counseling and coordination of care.   Verita Schneiders, MD, [place] [facility] [employer], [place]

## 2018-01-11 DIAGNOSIS — K219 Gastro-esophageal reflux disease without esophagitis: Secondary | ICD-10-CM | POA: Diagnosis not present

## 2018-01-18 DIAGNOSIS — R42 Dizziness and giddiness: Secondary | ICD-10-CM | POA: Diagnosis not present

## 2018-01-18 DIAGNOSIS — R4189 Other symptoms and signs involving cognitive functions and awareness: Secondary | ICD-10-CM | POA: Diagnosis not present

## 2018-01-18 DIAGNOSIS — Z0001 Encounter for general adult medical examination with abnormal findings: Secondary | ICD-10-CM | POA: Diagnosis not present

## 2018-01-18 DIAGNOSIS — E039 Hypothyroidism, unspecified: Secondary | ICD-10-CM | POA: Diagnosis not present

## 2018-01-18 DIAGNOSIS — K219 Gastro-esophageal reflux disease without esophagitis: Secondary | ICD-10-CM | POA: Diagnosis not present

## 2018-01-18 DIAGNOSIS — R7303 Prediabetes: Secondary | ICD-10-CM | POA: Diagnosis not present

## 2018-01-18 DIAGNOSIS — E782 Mixed hyperlipidemia: Secondary | ICD-10-CM | POA: Diagnosis not present

## 2018-01-27 DIAGNOSIS — F411 Generalized anxiety disorder: Secondary | ICD-10-CM | POA: Diagnosis not present

## 2018-01-27 DIAGNOSIS — F331 Major depressive disorder, recurrent, moderate: Secondary | ICD-10-CM | POA: Diagnosis not present

## 2018-02-02 ENCOUNTER — Other Ambulatory Visit: Payer: Medicare Other

## 2018-02-02 ENCOUNTER — Ambulatory Visit: Payer: Medicare Other

## 2018-03-18 DIAGNOSIS — M25511 Pain in right shoulder: Secondary | ICD-10-CM | POA: Diagnosis not present

## 2018-04-04 DIAGNOSIS — E039 Hypothyroidism, unspecified: Secondary | ICD-10-CM | POA: Diagnosis not present

## 2018-04-04 DIAGNOSIS — E782 Mixed hyperlipidemia: Secondary | ICD-10-CM | POA: Diagnosis not present

## 2018-04-04 DIAGNOSIS — R7303 Prediabetes: Secondary | ICD-10-CM | POA: Diagnosis not present

## 2018-04-04 DIAGNOSIS — Z0001 Encounter for general adult medical examination with abnormal findings: Secondary | ICD-10-CM | POA: Diagnosis not present

## 2018-04-04 DIAGNOSIS — R4189 Other symptoms and signs involving cognitive functions and awareness: Secondary | ICD-10-CM | POA: Diagnosis not present

## 2018-04-17 DIAGNOSIS — Z23 Encounter for immunization: Secondary | ICD-10-CM | POA: Diagnosis not present

## 2018-04-17 DIAGNOSIS — H8111 Benign paroxysmal vertigo, right ear: Secondary | ICD-10-CM | POA: Diagnosis not present

## 2018-04-17 DIAGNOSIS — E782 Mixed hyperlipidemia: Secondary | ICD-10-CM | POA: Diagnosis not present

## 2018-04-17 DIAGNOSIS — Z0001 Encounter for general adult medical examination with abnormal findings: Secondary | ICD-10-CM | POA: Diagnosis not present

## 2018-04-17 DIAGNOSIS — M25511 Pain in right shoulder: Secondary | ICD-10-CM | POA: Diagnosis not present

## 2018-04-17 DIAGNOSIS — Z13820 Encounter for screening for osteoporosis: Secondary | ICD-10-CM | POA: Diagnosis not present

## 2018-04-18 ENCOUNTER — Other Ambulatory Visit: Payer: Self-pay | Admitting: Nurse Practitioner

## 2018-04-26 DIAGNOSIS — F331 Major depressive disorder, recurrent, moderate: Secondary | ICD-10-CM | POA: Diagnosis not present

## 2018-04-26 DIAGNOSIS — F411 Generalized anxiety disorder: Secondary | ICD-10-CM | POA: Diagnosis not present

## 2018-05-03 DIAGNOSIS — R4189 Other symptoms and signs involving cognitive functions and awareness: Secondary | ICD-10-CM | POA: Insufficient documentation

## 2018-05-22 ENCOUNTER — Telehealth: Payer: Self-pay | Admitting: Physical Therapy

## 2018-05-22 NOTE — Telephone Encounter (Signed)
Contacted patient regarding referral for vestibular evaluation.  Educated pt that [facility] is temporarily closed and working on a reduced caseload due to COVID-19 and screened pt to see if she would be appropriate to bring into the clinic for an evaluation.  Pt was having falls due to dizziness with possible injury to her UE but pt has recently moved in with her daughter for safety and is taking Meclizine.  Patient's daughter is not allowing patient to leave the house for appointments at this time and would not be open to home health PT.    Educated pt on falls prevention recommendations and that a Telehealth may be possible in the future if approved by Medicare.  A Telehealth visit may be difficult with this particular patient; she is familiar to this clinic and previously had multi-canal BPPV.  Will continue to follow up and may discuss options for visits with patient's daughter if dizziness and falls persist.  Rico Junker, PT, DPT 05/22/18    12:11 PM

## 2018-07-12 DIAGNOSIS — M25511 Pain in right shoulder: Secondary | ICD-10-CM | POA: Diagnosis not present

## 2018-07-18 DIAGNOSIS — M25511 Pain in right shoulder: Secondary | ICD-10-CM | POA: Diagnosis not present

## 2018-07-19 ENCOUNTER — Telehealth: Payer: Self-pay | Admitting: *Deleted

## 2018-07-19 NOTE — Telephone Encounter (Signed)
Virtual Visit Pre-Appointment Phone Call  "(Name), I am calling you today to discuss your upcoming appointment. We are currently trying to limit exposure to the virus that causes COVID-19 by seeing patients at home rather than in the office."  1. "What is the BEST phone number to call the day of the visit?" - include this in appointment notes  2. "Do you have or have access to (through a family member/friend) a smartphone with video capability that we can use for your visit?" a. If yes - list this number in appt notes as "cell" (if different from BEST phone #) and list the appointment type as a VIDEO visit in appointment notes b. If no - list the appointment type as a PHONE visit in appointment notes  3. Confirm consent - "In the setting of the current Covid19 crisis, you are scheduled for a (phone or video) visit with your provider on (Monday, June 8) at (1:00 pm ).  Just as we do with many in-office visits, in order for you to participate in this visit, we must obtain consent.  If you'd like, I can send this to your mychart (if signed up) or email for you to review.  Otherwise, I can obtain your verbal consent now.  All virtual visits are billed to your insurance company just like a normal visit would be.  By agreeing to a virtual visit, we'd like you to understand that the technology does not allow for your provider to perform an examination, and thus may limit your provider's ability to fully assess your condition. If your provider identifies any concerns that need to be evaluated in person, we will make arrangements to do so.  Finally, though the technology is pretty good, we cannot assure that it will always work on either your or our end, and in the setting of a video visit, we may have to convert it to a phone-only visit.  In either situation, we cannot ensure that we have a secure connection.  Are you willing to proceed?" STAFF: Did the patient verbally acknowledge consent to telehealth  visit? Document YES/NO here: YES.  4. Advise patient to be prepared - "Two hours prior to your appointment, go ahead and check your blood pressure, pulse, oxygen saturation, and your weight (if you have the equipment to check those) and write them all down. When your visit starts, your provider will ask you for this information. If you have an Apple Watch or Kardia device, please plan to have heart rate information ready on the day of your appointment. Please have a pen and paper handy nearby the day of the visit as well."  5. Give patient instructions for MyChart download to smartphone OR [email or web address] as below if video visit (depending on what platform provider is using)  6. Inform patient they will receive a phone call 15 minutes prior to their appointment time (may be from unknown caller ID) so they should be prepared to answer    TELEPHONE CALL NOTE  Sadeel Fiddler has been deemed a candidate for a follow-up tele-health visit to limit community exposure during the Covid-19 pandemic. I spoke with the patient via phone to ensure availability of phone/video source, confirm preferred email & phone number, and discuss instructions and expectations.  I reminded Raynelle Fujikawa to be prepared with any vital sign and/or heart rhythm information that could potentially be obtained via home monitoring, at the time of her visit. I reminded Cacey Willow to expect a phone call prior  to her visit.  Danessa Mensch Avanell Shackleton 07/19/2018 12:02 PM   INSTRUCTIONS FOR DOWNLOADING THE MYCHART APP TO SMARTPHONE  - The patient must first make sure to have activated MyChart and know their login information - If Apple, go to [employer] and type in MyChart in the search bar and download the app. If Android, ask patient to go to [employer] and type in [place] in the search bar and download the app. The app is free but as with any other app downloads, their phone may require them to verify saved payment  information or Apple/Android password.  - The patient will need to then log into the app with their MyChart username and password, and select [facility] as their healthcare provider to link the account. When it is time for your visit, go to the MyChart app, find appointments, and click Begin Video Visit. Be sure to Select Allow for your device to access the Microphone and Camera for your visit. You will then be connected, and your provider will be with you shortly.  **If they have any issues connecting, or need assistance please contact MyChart service desk [phone])**  **If using a computer, in order to ensure the best quality for their visit they will need to use either of the following Internet Browsers: [employer], or Google Chrome**  IF USING DOXIMITY or DOXY.ME - The patient will receive a link just prior to their visit by text.     FULL LENGTH CONSENT FOR TELE-HEALTH VISIT   I hereby voluntarily request, consent and authorize [facility] and its employed or contracted physicians, physician assistants, nurse practitioners or other licensed health care professionals (the Practitioner), to provide me with telemedicine health care services (the "Services") as deemed necessary by the treating Practitioner. I acknowledge and consent to receive the Services by the Practitioner via telemedicine. I understand that the telemedicine visit will involve communicating with the Practitioner through live audiovisual communication technology and the disclosure of certain medical information by electronic transmission. I acknowledge that I have been given the opportunity to request an in-person assessment or other available alternative prior to the telemedicine visit and am voluntarily participating in the telemedicine visit.  I understand that I have the right to withhold or withdraw my consent to the use of telemedicine in the course of my care at any time, without affecting my right  to future care or treatment, and that the Practitioner or I may terminate the telemedicine visit at any time. I understand that I have the right to inspect all information obtained and/or recorded in the course of the telemedicine visit and may receive copies of available information for a reasonable fee.  I understand that some of the potential risks of receiving the Services via telemedicine include:  Marland Kitchen Delay or interruption in medical evaluation due to technological equipment failure or disruption; . Information transmitted may not be sufficient (e.g. poor resolution of images) to allow for appropriate medical decision making by the Practitioner; and/or  . In rare instances, security protocols could fail, causing a breach of personal health information.  Furthermore, I acknowledge that it is my responsibility to provide information about my medical history, conditions and care that is complete and accurate to the best of my ability. I acknowledge that Practitioner's advice, recommendations, and/or decision may be based on factors not within their control, such as incomplete or inaccurate data provided by me or distortions of diagnostic images or specimens that may result from electronic transmissions.  I understand that the practice of medicine is not an exact science and that Practitioner makes no warranties or guarantees regarding treatment outcomes. I acknowledge that I will receive a copy of this consent concurrently upon execution via email to the email address I last provided but may also request a printed copy by calling the office of [facility].    I understand that my insurance will be billed for this visit.   I have read or had this consent read to me. . I understand the contents of this consent, which adequately explains the benefits and risks of the Services being provided via telemedicine.  . I have been provided ample opportunity to ask questions regarding this consent and the Services  and have had my questions answered to my satisfaction. . I give my informed consent for the services to be provided through the use of telemedicine in my medical care  By participating in this telemedicine visit I agree to the above.

## 2018-07-20 DIAGNOSIS — F411 Generalized anxiety disorder: Secondary | ICD-10-CM | POA: Diagnosis not present

## 2018-07-20 DIAGNOSIS — F331 Major depressive disorder, recurrent, moderate: Secondary | ICD-10-CM | POA: Diagnosis not present

## 2018-07-21 DIAGNOSIS — R635 Abnormal weight gain: Secondary | ICD-10-CM | POA: Diagnosis not present

## 2018-07-21 DIAGNOSIS — I10 Essential (primary) hypertension: Secondary | ICD-10-CM | POA: Diagnosis not present

## 2018-07-21 DIAGNOSIS — M25511 Pain in right shoulder: Secondary | ICD-10-CM | POA: Diagnosis not present

## 2018-07-21 DIAGNOSIS — E782 Mixed hyperlipidemia: Secondary | ICD-10-CM | POA: Diagnosis not present

## 2018-07-21 DIAGNOSIS — E039 Hypothyroidism, unspecified: Secondary | ICD-10-CM | POA: Diagnosis not present

## 2018-07-21 DIAGNOSIS — K219 Gastro-esophageal reflux disease without esophagitis: Secondary | ICD-10-CM | POA: Diagnosis not present
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
## 2018-07-24 ENCOUNTER — Other Ambulatory Visit: Payer: Self-pay

## 2018-07-24 ENCOUNTER — Encounter: Payer: Self-pay | Admitting: Nurse Practitioner

## 2018-07-24 ENCOUNTER — Telehealth (INDEPENDENT_AMBULATORY_CARE_PROVIDER_SITE_OTHER): Payer: Medicare Other | Admitting: Nurse Practitioner

## 2018-07-24 VITALS — BP 148/86 | HR 89 | Ht 64.0 in | Wt 158.0 lb

## 2018-07-24 DIAGNOSIS — F329 Major depressive disorder, single episode, unspecified: Secondary | ICD-10-CM | POA: Diagnosis not present

## 2018-07-24 DIAGNOSIS — R0600 Dyspnea, unspecified: Secondary | ICD-10-CM

## 2018-07-24 DIAGNOSIS — R0609 Other forms of dyspnea: Secondary | ICD-10-CM

## 2018-07-24 DIAGNOSIS — E039 Hypothyroidism, unspecified: Secondary | ICD-10-CM | POA: Diagnosis not present

## 2018-07-24 DIAGNOSIS — R000 Tachycardia, unspecified: Secondary | ICD-10-CM

## 2018-07-24 DIAGNOSIS — Z7189 Other specified counseling: Secondary | ICD-10-CM

## 2018-07-24 DIAGNOSIS — I10 Essential (primary) hypertension: Secondary | ICD-10-CM | POA: Diagnosis not present

## 2018-07-24 DIAGNOSIS — R002 Palpitations: Secondary | ICD-10-CM

## 2018-07-24 DIAGNOSIS — E782 Mixed hyperlipidemia: Secondary | ICD-10-CM | POA: Diagnosis not present

## 2018-07-24 NOTE — Patient Instructions (Addendum)
After Visit Summary:  We will be checking the following labs today - NONE   Medication Instructions:    Continue with your current medicines.   Take the Metoprolol twice a day - I think this will help your BP and your fast heart beating.    If you need a refill on your cardiac medications before your next appointment, please call your pharmacy.     Testing/Procedures To Be Arranged:  N/A  Follow-Up:   See Dr. Marlou Porch in 4 months.     At [facility], you and your health needs are our priority.  As part of our continuing mission to provide you with exceptional heart care, we have created designated Provider Care Teams.  These Care Teams include your primary Cardiologist (physician) and Advanced Practice Providers (APPs -  Physician Assistants and Nurse Practitioners) who all work together to provide you with the care you need, when you need it.  Special Instructions:  . Stay safe, stay home, wash your hands for at least 20 seconds and wear a mask when out in public.  . It was good to talk with you today.    Call the [place] office at [phone] if you have any questions, problems or concerns.

## 2018-08-13 DIAGNOSIS — Z20828 Contact with and (suspected) exposure to other viral communicable diseases: Secondary | ICD-10-CM | POA: Diagnosis not present

## 2018-08-30 DIAGNOSIS — I10 Essential (primary) hypertension: Secondary | ICD-10-CM | POA: Diagnosis not present

## 2018-08-30 DIAGNOSIS — F329 Major depressive disorder, single episode, unspecified: Secondary | ICD-10-CM | POA: Diagnosis not present

## 2018-08-30 DIAGNOSIS — R635 Abnormal weight gain: Secondary | ICD-10-CM | POA: Diagnosis not present

## 2018-08-30 DIAGNOSIS — E039 Hypothyroidism, unspecified: Secondary | ICD-10-CM | POA: Diagnosis not present

## 2018-08-30 DIAGNOSIS — M75101 Unspecified rotator cuff tear or rupture of right shoulder, not specified as traumatic: Secondary | ICD-10-CM | POA: Diagnosis not present

## 2018-08-30 DIAGNOSIS — E782 Mixed hyperlipidemia: Secondary | ICD-10-CM | POA: Diagnosis not present

## 2018-09-05 DIAGNOSIS — E782 Mixed hyperlipidemia: Secondary | ICD-10-CM | POA: Diagnosis not present

## 2018-09-05 DIAGNOSIS — E039 Hypothyroidism, unspecified: Secondary | ICD-10-CM | POA: Diagnosis not present

## 2018-09-14 ENCOUNTER — Other Ambulatory Visit: Payer: Self-pay

## 2018-10-17 DIAGNOSIS — F331 Major depressive disorder, recurrent, moderate: Secondary | ICD-10-CM | POA: Diagnosis not present

## 2018-10-17 DIAGNOSIS — F411 Generalized anxiety disorder: Secondary | ICD-10-CM | POA: Diagnosis not present

## 2018-10-19 ENCOUNTER — Ambulatory Visit: Payer: Medicare Other | Admitting: Rehabilitative and Restorative Service Providers"

## 2018-10-22 ENCOUNTER — Emergency Department (HOSPITAL_COMMUNITY)
Admission: EM | Admit: 2018-10-22 | Discharge: 2018-10-22 | Disposition: A | Discharge status: Home / Self Care | Payer: Medicare Other | Attending: Emergency Medicine | Admitting: Emergency Medicine

## 2018-10-22 ENCOUNTER — Emergency Department (HOSPITAL_COMMUNITY): Payer: Medicare Other

## 2018-10-22 ENCOUNTER — Encounter (HOSPITAL_COMMUNITY): Payer: Self-pay

## 2018-10-22 ENCOUNTER — Other Ambulatory Visit: Payer: Self-pay

## 2018-10-22 DIAGNOSIS — R51 Headache: Secondary | ICD-10-CM | POA: Diagnosis not present

## 2018-10-22 DIAGNOSIS — Y93E1 Activity, personal bathing and showering: Secondary | ICD-10-CM | POA: Insufficient documentation

## 2018-10-22 DIAGNOSIS — Y92012 Bathroom of single-family (private) house as the place of occurrence of the external cause: Secondary | ICD-10-CM | POA: Diagnosis not present

## 2018-10-22 DIAGNOSIS — S0990XA Unspecified injury of head, initial encounter: Secondary | ICD-10-CM | POA: Diagnosis not present

## 2018-10-22 DIAGNOSIS — I10 Essential (primary) hypertension: Secondary | ICD-10-CM | POA: Insufficient documentation

## 2018-10-22 DIAGNOSIS — Z79899 Other long term (current) drug therapy: Secondary | ICD-10-CM | POA: Insufficient documentation

## 2018-10-22 DIAGNOSIS — S060X9A Concussion with loss of consciousness of unspecified duration, initial encounter: Secondary | ICD-10-CM | POA: Insufficient documentation

## 2018-10-22 DIAGNOSIS — W01198A Fall on same level from slipping, tripping and stumbling with subsequent striking against other object, initial encounter: Secondary | ICD-10-CM | POA: Diagnosis not present

## 2018-10-22 DIAGNOSIS — Y998 Other external cause status: Secondary | ICD-10-CM | POA: Insufficient documentation

## 2018-10-22 NOTE — ED Provider Notes (Signed)
[facility] DEPT Provider Note   CSN: [id number] Arrival date & time: 10/22/18  1051     History   Chief Complaint Chief Complaint  Patient presents with  . Fall  . Headache    HPI Doris [name] is a 73 y.o. female.     HPI  She presents for evaluation of injury that occurred about a week ago when she was getting out of the shower, she felt dizzy and fell striking her head.  She believes she lost consciousness.  She was ultimately able to get up and ambulate but fell again later the same day.  At that point she injured her left chest.  Since that time her chest discomfort has improved but she has continued discomfort in her head that is characterized by a sensation of not feeling like herself.  She states that sometimes she has a feeling that she needs to go out and look for herself.  She denies persistent headache, blurred vision, change in smell or taste, shaking, focal weakness or paresthesia.  She is taking her usual medications.  She [place] a retirement community, independently.  There are no other known modifying factors.  Past Medical History:  Diagnosis Date  . Acid reflux   . Back pain   . HLD (hyperlipidemia)   . Hypertension   . Insomnia   . Migraine   . Osteoarthritis    knee  . Psychosis ([place])    followed by Dr. Keenan Bachelor [phone]  . Thyroid nodule 2010   TSH 04/2008 = 0.296, no follow up noted in [facility]  . Vertigo     Patient Active Problem List   Diagnosis Date Noted  . Muscle spasms of neck 08/26/2016  . Abnormality of gait 08/10/2016  . Osteoporosis 10/13/2015  . Acrochordon 07/07/2015  . Jaw pain 12/30/2014  . Coronary artery calcification 12/23/2014  . Tachycardia 08/27/2014  . Cholelithiasis 05/17/2014  . GERD (gastroesophageal reflux disease) 01/02/2013  . TB lung, latent 05/01/2012  . Osteoarthritis 03/01/2012  . Severe major depression with psychotic features ([place]) 04/01/2011  . Hypothyroidism (acquired)  03/22/2011  . Healthcare maintenance 12/28/2010  . BPPV (benign paroxysmal positional vertigo) 11/19/2008  . Hyperlipidemia 11/30/2005  . Essential hypertension 11/30/2005  . LOW BACK PAIN 11/30/2005    Past Surgical History:  Procedure Laterality Date  . TONSILLECTOMY    . UTERINE FIBROID SURGERY       OB History    Gravida  4   Para      Term      Preterm      AB      Living  3     SAB      TAB      Ectopic      Multiple      Live Births  3            Home Medications    Prior to Admission medications   Medication Sig Start Date End Date Taking? Authorizing Provider  ALPRAZolam Duanne Moron) 0.25 MG tablet Take 0.25 mg by mouth at bedtime as needed for anxiety.    [provider]  amLODipine (NORVASC) 5 MG tablet TAKE 1 TABLET BY MOUTH EVERY DAY 09/17/16   Velna Ochs, MD  atorvastatin (LIPITOR) 10 MG tablet Take 10 mg by mouth daily.    [provider]  DULoxetine (CYMBALTA) 30 MG capsule Take by mouth daily. 06/15/16   [provider]  hydrOXYzine (ATARAX/VISTARIL) 10 MG tablet Take 10  mg by mouth.  07/20/18   [provider]  levothyroxine (SYNTHROID, LEVOTHROID) 25 MCG tablet Take 25 mcg by mouth daily before breakfast.    [provider]  meclizine (ANTIVERT) 12.5 MG tablet Take 12.5 mg by mouth as needed for dizziness or nausea.     [provider]  meclizine (ANTIVERT) 25 MG tablet Take 25 mg by mouth 3 (three) times daily as needed for dizziness or nausea.    [provider]  meloxicam (MOBIC) 15 MG tablet Take 30 mg by mouth daily.     [provider]  metoprolol tartrate (LOPRESSOR) 50 MG tablet TAKE 1 TABLET BY MOUTH TWICE A DAY 04/18/18   Burtis Junes, NP  Olopatadine HCl (PAZEO) 0.7 % SOLN Place 1 drop into both eyes daily.     [provider]  omeprazole (PRILOSEC) 40 MG capsule Take 40 mg by mouth daily.    [provider]    Family History Family  History  Problem Relation Age of Onset  . Heart disease Mother   . Heart disease Father   . Heart disease Other   . Stroke Neg Hx   . Cancer Neg Hx   . Colon cancer Neg Hx     Social History Social History   Tobacco Use  . Smoking status: Never Smoker  . Smokeless tobacco: Never Used  Substance Use Topics  . Alcohol use: No    Alcohol/week: 0.0 standard drinks  . Drug use: No     Allergies   Ibuprofen, Lidocaine, and Olanzapine   Review of Systems Review of Systems  All other systems reviewed and are negative.    Physical Exam Updated Vital Signs BP 128/80   Pulse 68   Temp 99.5 F (37.5 C) (Oral)   Resp 15   Wt 74.8 kg   LMP 03/27/1984   SpO2 97%   BMI 28.32 kg/m   Physical Exam Vitals signs and nursing note reviewed.  Constitutional:      General: She is not in acute distress.    Appearance: She is well-developed. She is not ill-appearing, toxic-appearing or diaphoretic.  HENT:     Head: Normocephalic and atraumatic.     Right Ear: External ear normal.     Left Ear: External ear normal.     Mouth/Throat:     Mouth: Mucous membranes are moist.     Pharynx: No oropharyngeal exudate or posterior oropharyngeal erythema.  Eyes:     Conjunctiva/sclera: Conjunctivae normal.     Pupils: Pupils are equal, round, and reactive to light.  Neck:     Musculoskeletal: Normal range of motion and neck supple.     Trachea: Phonation normal.  Cardiovascular:     Rate and Rhythm: Normal rate and regular rhythm.  Pulmonary:     Effort: Pulmonary effort is normal.     Breath sounds: Normal breath sounds.  Chest:     Chest wall: No tenderness.  Abdominal:     General: There is no distension.     Palpations: Abdomen is soft.     Tenderness: There is no abdominal tenderness. There is no guarding.  Musculoskeletal: Normal range of motion.  Skin:    General: Skin is warm and dry.  Neurological:     Mental Status: She is alert and oriented to person, place, and  time.     Motor: No abnormal muscle tone.     Comments: No dysarthria or aphasia.  No pronator drift or ataxia.  Psychiatric:        Mood and Affect: Mood normal.        Behavior: Behavior normal.        Thought Content: Thought content normal.        Judgment: Judgment normal.      ED Treatments / Results  Labs (all labs ordered are listed, but only abnormal results are displayed) Labs Reviewed - No data to display  EKG None  Radiology Ct Head Wo Contrast  Result Date: 10/22/2018 CLINICAL DATA:  Pt states she fell 1 week ago. Pt states she doesn't feel still. Pt states she is still having headaches. Pt states with movement, it feels like her head is being crushed. Pt states when she fell, she hit her head and may have lost consciousness. EXAM: CT HEAD WITHOUT CONTRAST TECHNIQUE: Contiguous axial images were obtained from the base of the skull through the vertex without intravenous contrast. COMPARISON:  11/15/2008 FINDINGS: Brain: No evidence of acute infarction, hemorrhage, hydrocephalus, extra-axial collection or mass lesion/mass effect. Vascular: There is dense atherosclerotic calcification of the internal carotid arteries. No hyperdense muscles. Skull: Normal. Negative for fracture or focal lesion. Sinuses/Orbits: No acute finding. Other: None. IMPRESSION: No evidence for acute intracranial abnormality. Electronically Signed   By: Nolon Nations M.D.   On: 10/22/2018 12:14    Procedures Procedures (including critical care time)  Medications Ordered in ED Medications - No data to display   Initial Impression / Assessment and Plan / ED Course  I have reviewed the triage vital signs and the nursing notes.  Pertinent labs & imaging results that were available during my care of the patient were reviewed by me and considered in my medical decision making (see chart for details).  Clinical Course as of Oct 21 1253  Sun Oct 22, 2018  1252 No acute abnormality, images read by me   CT Head Wo Contrast [EW]    Clinical Course User Index [EW] Daleen Bo, MD        Patient Vitals for the past 24 hrs:  BP Temp Temp src Pulse Resp SpO2 Weight  10/22/18 1230 128/80 - - 68 - 97 % -  10/22/18 1212 121/78 - - 71 15 99 % -  10/22/18 1101 (!) 151/85 99.5 F (37.5 C) Oral 90 16 100 % -  10/22/18 1100 - - - - - - 74.8 kg    12:53 PM Reevaluation with update and discussion. After initial assessment and treatment, an updated evaluation reveals no change in clinical status, findings discussed with the patient and all questions were answered. Daleen Bo   Medical Decision Making: Evaluation consistent with concussion post head injury.  Doubt serious intracranial abnormality, bony fracture or acute illness.  CRITICAL CARE-no Performed by: Daleen Bo  Nursing Notes Reviewed/ Care Coordinated Applicable Imaging Reviewed Interpretation of Laboratory Data incorporated into ED treatment  The patient appears reasonably screened and/or stabilized for discharge and I doubt any other medical condition or other [facility] requiring further screening, evaluation, or treatment in the ED at this time prior to discharge.  Plan: Home Medications-routine medication including Tylenol if needed for pain; Home Treatments-gradual advance activity; return here if the recommended treatment, does not improve the symptoms; Recommended follow up-PCP follow-up if not better in 1 or 2 weeks.    Final Clinical Impressions(s) / ED Diagnoses   Final diagnoses:  Injury of head, initial encounter  Concussion with loss of consciousness, initial encounter    ED Discharge Orders  None       Daleen Bo, MD 10/22/18 1255

## 2018-10-22 NOTE — ED Triage Notes (Signed)
Pt states she fell 1 week ago. Pt states she doesn't feel still. Pt states she is still having headaches. Pt states with movement, it feels like her head is being crushed. Pt states when she fell, she hit her head and may have lost conciousness. Pt requesting CT.

## 2018-10-22 NOTE — Discharge Instructions (Addendum)
Your continued symptoms are most likely related to a concussion, following the head injury.  The symptoms should gradually resolve over the next several weeks.  It is important to make sure you are getting plenty of rest and eating a good diet, every day.  Follow-up with your primary care doctor if you feel you are worsening or not getting better.

## 2018-10-25 DIAGNOSIS — F0781 Postconcussional syndrome: Secondary | ICD-10-CM | POA: Diagnosis not present

## 2018-10-25 DIAGNOSIS — H8111 Benign paroxysmal vertigo, right ear: Secondary | ICD-10-CM | POA: Diagnosis not present

## 2018-10-25 DIAGNOSIS — G4709 Other insomnia: Secondary | ICD-10-CM | POA: Diagnosis not present

## 2018-10-25 DIAGNOSIS — G44309 Post-traumatic headache, unspecified, not intractable: Secondary | ICD-10-CM | POA: Diagnosis not present

## 2018-10-27 ENCOUNTER — Ambulatory Visit: Payer: Medicare Other | Attending: Internal Medicine | Admitting: Physical Therapy

## 2018-10-27 ENCOUNTER — Other Ambulatory Visit: Payer: Self-pay

## 2018-10-27 ENCOUNTER — Encounter: Payer: Self-pay | Admitting: Physical Therapy

## 2018-10-27 DIAGNOSIS — R2681 Unsteadiness on feet: Secondary | ICD-10-CM | POA: Insufficient documentation

## 2018-10-27 DIAGNOSIS — R2689 Other abnormalities of gait and mobility: Secondary | ICD-10-CM | POA: Insufficient documentation

## 2018-10-27 DIAGNOSIS — H8113 Benign paroxysmal vertigo, bilateral: Secondary | ICD-10-CM

## 2018-10-27 DIAGNOSIS — R42 Dizziness and giddiness: Secondary | ICD-10-CM | POA: Insufficient documentation

## 2018-10-27 NOTE — Therapy (Signed)
[facility] [address] [place], [place], [address] Phone: [phone]   Fax:  [phone]  Physical Therapy Evaluation  Patient Details  Name: Doris [name] MRN: [id number] Date of Birth: 1945-08-12 Referring Provider (PT): Audley Hose, MD   Encounter Date: 10/27/2018  PT End of Session - 10/27/18 2052    Visit Number  1    Number of Visits  13    Date for PT Re-Evaluation  12/26/18    Authorization Type  Medicare and Medicaid - 10th visit PN    PT Start Time  0800    PT Stop Time  0850    PT Time Calculation (min)  50 min    Activity Tolerance  Patient tolerated treatment well    Behavior During Therapy  Anxious       Past Medical History:  Diagnosis Date  . Acid reflux   . Back pain   . HLD (hyperlipidemia)   . Hypertension   . Insomnia   . Migraine   . Osteoarthritis    knee  . Psychosis ([place])    followed by Dr. Keenan Bachelor [phone]  . Thyroid nodule 2010   TSH 04/2008 = 0.296, no follow up noted in [facility]  . Vertigo     Past Surgical History:  Procedure Laterality Date  . TONSILLECTOMY    . UTERINE FIBROID SURGERY      There were no vitals filed for this visit.   Subjective Assessment - 10/27/18 0812    Subjective  Pt reports dizziness feels the same as dizziness treated for 2 years ago but it is stronger.  Started one day when she sat up on the bed - tried the exercise but has continued to have dizziness.  Has been taking Meclizine as needed.  Two weeks ago pt was getting out of the shower, became dizzy, fell with + LOC.  Went ED and assessed for head injury; no acute findings on imaging.  Has experienced 2 major falls since dizziness started but multiple other near falls.  Heart racing has settled down.  Has had a headache since the fall.    Pertinent History  multi-canal BPPV, vertigo, thyroid nodule and hypothyroidism, OA, osteoporosis, depression with psychosis,tachycardia and  palpitations, migraine, insomnia, HLD, back pain, HTN, coronary artery calcification, multiple falls with head injury, and latent TB    Limitations  Standing;Walking    Diagnostic tests  CT scan following most recent fall    Currently in Pain?  Yes    Pain Score  3     Pain Location  Head    Pain Descriptors / Indicators  Headache    Pain Type  Acute pain         [facility] PT Assessment - 10/27/18 0820      Assessment   Medical Diagnosis  Vertigo and falls    Referring Provider (PT)  Audley Hose, MD    Onset Date/Surgical Date  10/10/18    Prior Therapy  yes for vertigo, 2 years ago      Precautions   Precautions  Other (comment)    Precaution Comments  multi-canal BPPV, vertigo, thyroid nodule and hypothyroidism, OA, osteoporosis, depression with psychosis,tachycardia and palpitations, migraine, insomnia, HLD, back pain, HTN, coronary artery calcification, multiple falls with head injury, and latent TB      Balance Screen   Has the patient fallen in the past 6 months  Yes    How many times?  2-3 major  Has the patient had a decrease in activity level because of a fear of falling?   Yes    Is the patient reluctant to leave their home because of a fear of falling?   Yes      [place] residence    Living Arrangements  Alone      Prior Function   Level of Independence  Independent      Observation/Other Assessments   Focus on Therapeutic Outcomes (FOTO)   N/A      Sensation   Light Touch  Impaired by gross assessment    Additional Comments  Reports burning in toes, ice pick sensation in heel.      Coordination   Gross Motor Movements are Fluid and Coordinated  Yes    Finger Nose Finger Test  WFL mildly delayed    Heel Shin Test  [facility] mildly delayed           Vestibular Assessment - 10/27/18 0825      Vestibular Assessment   General Observation  Mild instability while ambulating      Symptom Behavior   Subjective  history of current problem  No changes in vision, no changes in hearing.  No pain with neck AROM.  Denies nausea and vomiting.  Does have a headache 3/10.    Type of Dizziness   Imbalance;Spinning    Frequency of Dizziness  daily    Duration of Dizziness  seconds    Symptom Nature  Motion provoked    Aggravating Factors  Supine to sit;Sit to stand;Rolling to right;Rolling to left    Relieving Factors  Lying supine    Progression of Symptoms  Worse    History of similar episodes  2 years ago - multi canal BPPV      Oculomotor Exam   Oculomotor Alignment  Normal    Ocular ROM  WFL    Spontaneous  Absent    Gaze-induced   Absent    Smooth Pursuits  Intact    Saccades  Slow    Comment  Convergence impaired      Oculomotor Exam-Fixation Suppressed    Left Head Impulse  Positive for refixation saccade    Right Head Impulse  mild refixation saccade      Vestibulo-Ocular Reflex   VOR to Slow Head Movement  Normal    VOR Cancellation  Normal      Positional Testing   Dix-Hallpike  Dix-Hallpike Right;Dix-Hallpike Left    Horizontal Canal Testing  Horizontal Canal Right;Horizontal Canal Left      Dix-Hallpike Right   Dix-Hallpike Right Duration  15 seconds    Dix-Hallpike Right Symptoms  Upbeat, right rotatory nystagmus;Downbeat, right rotatory nystagmus      Dix-Hallpike Left   Dix-Hallpike Left Duration  30 seconds    Dix-Hallpike Left Symptoms  Downbeat, left rotatory nystagmus          Objective measurements completed on examination: See above findings.       Vestibular Treatment/Exercise - 10/27/18 0843      Vestibular Treatment/Exercise   Vestibular Treatment Provided  Canalith Repositioning    Canalith Repositioning  Epley Manuever Right       EPLEY MANUEVER RIGHT   Number of Reps   2    Overall Response  Improved Symptoms    Response Details   first performed Epley canal for posterior canal and then deep head hanging for long arm of posterior canal  PT Education - 10/27/18 2051    Education Details  clinical findings, PT POC and goals    Person(s) Educated  Patient    Methods  Explanation    Comprehension  Verbalized understanding       PT Short Term Goals - 10/28/18 1307      PT SHORT TERM GOAL #1   Title  Pt will demonstrate independence with initial HEP    Time  4    Period  Weeks    Status  New    Target Date  11/27/18      PT SHORT TERM GOAL #2   Title  Pt will participate in further assessment of balance and falls risk    Baseline  gait velocity, FGA TBA    Time  4    Period  Weeks    Status  New    Target Date  11/27/18      PT SHORT TERM GOAL #3   Title  Pt will tolerate canalith repositioning manuevers for L and R side with resolution of symptoms    Time  4    Period  Weeks    Status  New    Target Date  11/27/18      PT SHORT TERM GOAL #4   Title  Pt will participate in further assessment of vestibular impairments with MSQ or DVA    Time  4    Period  Weeks    Status  New    Target Date  11/27/18        PT Long Term Goals - 10/28/18 1309      PT LONG TERM GOAL #1   Title  Pt will demonstrate independence with vestibular and balance HEP    Time  8    Period  Weeks    Status  New    Target Date  12/27/18      PT LONG TERM GOAL #2   Title  Pt will report no symptoms of vertigo or motion sensitivity during bed mobility, ambulation and ADLs.    Baseline  MSQ TBA    Time  8    Period  Weeks    Status  New    Target Date  12/27/18      PT LONG TERM GOAL #3   Title  Pt will demonstrate decreased falls risk as indicated by improvement in FGA by 4 points and gait velocity >2.62 ft/sec    Baseline  TBA for FGA and gait velocity    Time  8    Period  Weeks    Status  New    Target Date  12/27/18      PT LONG TERM GOAL #4   Title  Pt will demonstrate improved use of VOR as indicated by improvement in DVA to 2-3 line difference    Baseline  TBA    Time  8    Period  Weeks     Status  New    Target Date  12/27/18      PT LONG TERM GOAL #5   Title  Pt will demonstrate negative positional testing for R and L side    Time  8    Period  Weeks    Status  New    Target Date  12/27/18             Plan - 10/27/18 2110    Clinical Impression Statement  Pt is a 73 year old female referred to  Ne[facility] for evaluation of vertigo and recurrent falls.  Pt's PMH is significant for the following: multi-canal BPPV, vertigo, thyroid nodule and hypothyroidism, OA, osteoporosis, depression with psychosis,tachycardia and palpitations, migraine, insomnia, HLD, back pain, HTN, coronary artery calcification, multiple falls with head injury, and latent TB.  The following deficits were noted during pt's exam: positive for nystagmus and vertigo during right and left hallpike-dix indicating bilateral canalithiasis, motion sensitivity, impaired VOR, headache with decreased cervical AROM, impaired balance and impaired gait increasing patient's risk for falls. Pt would benefit from skilled PT to address these impairments and functional limitations to maximize functional mobility independence and reduce falls risk    Personal Factors and Comorbidities  Comorbidity 3+;Past/Current Experience;Time since onset of injury/illness/exacerbation;Transportation;Social Background    Comorbidities  multi-canal BPPV, vertigo, thyroid nodule and hypothyroidism, OA, osteoporosis, depression with psychosis,tachycardia and palpitations, migraine, insomnia, HLD, back pain, HTN, coronary artery calcification, multiple falls with head injury, and latent TB    Examination-Activity Limitations  Bed Mobility;Locomotion Level;Stairs;Transfers;Stand;Bend    Examination-Participation Restrictions  Driving;Community Activity;Cleaning    Stability/Clinical Decision Making  Evolving/Moderate complexity    Clinical Decision Making  Moderate    Rehab Potential  Good    PT Frequency  2x / week    PT Duration  8 weeks     PT Treatment/Interventions  ADLs/Self Care Home Management;Canalith Repostioning;Gait training;Stair training;Functional mobility training;Therapeutic activities;Therapeutic exercise;Balance training;Neuromuscular re-education;Patient/family education;Vestibular;Passive range of motion;Manual techniques;Moist Heat;Cryotherapy;Dry needling    PT Next Visit Plan  re-assess R BPPV and treat as indicated and then address L (downbeating - long arm of posterior canal vs. anterior?); perform balance assessment - FGA/gait velocity, MSQ.  Initiate balance and vestibular HEP.  If still having headaches assess neck ROM/proprioception.    PT Home Exercise Plan  cervical strengthening/proprioception, vestibular, balance    Consulted and Agree with Plan of Care  Patient       Patient will benefit from skilled therapeutic intervention in order to improve the following deficits and impairments:  Decreased balance, Difficulty walking, Dizziness, Pain, Decreased activity tolerance, Decreased range of motion  Visit Diagnosis: BPPV (benign paroxysmal positional vertigo), bilateral  Dizziness and giddiness  Unsteadiness on feet  Other abnormalities of gait and mobility     Problem List Patient Active Problem List   Diagnosis Date Noted  . Muscle spasms of neck 08/26/2016  . Abnormality of gait 08/10/2016  . Osteoporosis 10/13/2015  . Acrochordon 07/07/2015  . Jaw pain 12/30/2014  . Coronary artery calcification 12/23/2014  . Tachycardia 08/27/2014  . Cholelithiasis 05/17/2014  . GERD (gastroesophageal reflux disease) 01/02/2013  . TB lung, latent 05/01/2012  . Osteoarthritis 03/01/2012  . Severe major depression with psychotic features ([place]) 04/01/2011  . Hypothyroidism (acquired) 03/22/2011  . Healthcare maintenance 12/28/2010  . BPPV (benign paroxysmal positional vertigo) 11/19/2008  . Hyperlipidemia 11/30/2005  . Essential hypertension 11/30/2005  . LOW BACK PAIN 11/30/2005    Rico Junker, PT, DPT 10/28/18    1:13 PM    [facility] [address] [place], [place], [address] Phone: [phone]   Fax:  [phone]  Name: Doris [name] MRN: [id number] Date of Birth: 03/17/1945

## 2018-11-03 DIAGNOSIS — H11121 Conjunctival concretions, right eye: Secondary | ICD-10-CM | POA: Diagnosis not present

## 2018-11-03 DIAGNOSIS — H1851 Endothelial corneal dystrophy: Secondary | ICD-10-CM | POA: Diagnosis not present

## 2018-11-03 DIAGNOSIS — H0102A Squamous blepharitis right eye, upper and lower eyelids: Secondary | ICD-10-CM | POA: Diagnosis not present

## 2018-11-03 DIAGNOSIS — H2513 Age-related nuclear cataract, bilateral: Secondary | ICD-10-CM | POA: Diagnosis not present

## 2018-11-03 DIAGNOSIS — R51 Headache: Secondary | ICD-10-CM | POA: Diagnosis not present

## 2018-11-03 DIAGNOSIS — H16223 Keratoconjunctivitis sicca, not specified as Sjogren's, bilateral: Secondary | ICD-10-CM | POA: Diagnosis not present

## 2018-11-03 DIAGNOSIS — H10413 Chronic giant papillary conjunctivitis, bilateral: Secondary | ICD-10-CM | POA: Diagnosis not present

## 2018-11-03 DIAGNOSIS — H0102B Squamous blepharitis left eye, upper and lower eyelids: Secondary | ICD-10-CM | POA: Diagnosis not present

## 2018-11-06 ENCOUNTER — Ambulatory Visit (INDEPENDENT_AMBULATORY_CARE_PROVIDER_SITE_OTHER): Payer: Medicare Other | Admitting: Podiatry

## 2018-11-06 ENCOUNTER — Other Ambulatory Visit: Payer: Self-pay

## 2018-11-06 ENCOUNTER — Ambulatory Visit (INDEPENDENT_AMBULATORY_CARE_PROVIDER_SITE_OTHER): Payer: Medicare Other

## 2018-11-06 ENCOUNTER — Other Ambulatory Visit: Payer: Self-pay | Admitting: Podiatry

## 2018-11-06 DIAGNOSIS — M79672 Pain in left foot: Secondary | ICD-10-CM

## 2018-11-06 DIAGNOSIS — M79676 Pain in unspecified toe(s): Secondary | ICD-10-CM

## 2018-11-06 DIAGNOSIS — M722 Plantar fascial fibromatosis: Secondary | ICD-10-CM

## 2018-11-06 DIAGNOSIS — B351 Tinea unguium: Secondary | ICD-10-CM

## 2018-11-06 DIAGNOSIS — M79671 Pain in right foot: Secondary | ICD-10-CM

## 2018-11-08 DIAGNOSIS — G44309 Post-traumatic headache, unspecified, not intractable: Secondary | ICD-10-CM | POA: Diagnosis not present

## 2018-11-08 DIAGNOSIS — R112 Nausea with vomiting, unspecified: Secondary | ICD-10-CM | POA: Diagnosis not present

## 2018-11-08 DIAGNOSIS — F0781 Postconcussional syndrome: Secondary | ICD-10-CM | POA: Diagnosis not present

## 2018-11-08 DIAGNOSIS — G4709 Other insomnia: Secondary | ICD-10-CM | POA: Diagnosis not present

## 2018-11-10 NOTE — Progress Notes (Signed)
   SUBJECTIVE 73 year old female presenting today as a new patient with a chief complaint of sharp stabbing pain noted to the bilateral heels that began several weeks ago. She has been diagnosed with plantar fasciitis in the past. Walking and being on her feet for long periods of time increases the pain. She has not had any recent treatment for the symptoms.  She also complains of elongated, thickened nails that cause pain while ambulating in shoes. She is unable to trim her own nails. Patient is here for further evaluation and treatment.  Past Medical History:  Diagnosis Date  . Acid reflux   . Back pain   . HLD (hyperlipidemia)   . Hypertension   . Insomnia   . Migraine   . Osteoarthritis    knee  . Psychosis ([place])    followed by Dr. Keenan Bachelor [phone]  . Thyroid nodule 2010   TSH 04/2008 = 0.296, no follow up noted in [facility]  . Vertigo     OBJECTIVE General Patient is awake, alert, and oriented x 3 and in no acute distress. Derm Skin is dry and supple bilateral. Negative open lesions or macerations. Remaining integument unremarkable. Nails are tender, long, thickened and dystrophic with subungual debris, consistent with onychomycosis, 1-5 bilateral. No signs of infection noted. Vasc  DP and PT pedal pulses palpable bilaterally. Temperature gradient within normal limits.  Neuro Epicritic and protective threshold sensation grossly intact bilaterally.  Musculoskeletal Exam Tenderness to palpation to the plantar aspect of the bilateral heels along the plantar fascia. No symptomatic pedal deformities noted bilateral. Muscular strength within normal limits.  Radiographic Exam:  Normal osseous mineralization. Joint spaces preserved. No fracture/dislocation/boney destruction.    ASSESSMENT 1. Onychodystrophic nails 1-5 bilateral with hyperkeratosis of nails.  2. Onychomycosis of nail due to dermatophyte bilateral 3. Plantar fasciitis bilateral   PLAN OF CARE 1. Patient evaluated  today. X-Rays reviewed.  2. Instructed to maintain good pedal hygiene and foot care.  3. Mechanical debridement of nails 1-5 bilaterally performed using a nail nipper. Filed with dremel without incident.  4. Injection of 0.5 mLs Celestone Soluspan injected into the bilateral heels.  5. Continue using OTC insoles.  6. Return to clinic in 3 mos for routine care.    Edrick Kins, DPM [facility]  Dr. Edrick Kins, [place] [address]                                        [place], [place] [address]                Office [phone]  Fax [phone]

## 2018-11-17 ENCOUNTER — Ambulatory Visit: Payer: Medicare Other | Attending: Internal Medicine | Admitting: Physical Therapy

## 2018-11-17 ENCOUNTER — Other Ambulatory Visit: Payer: Self-pay

## 2018-11-17 ENCOUNTER — Encounter: Payer: Self-pay | Admitting: Physical Therapy

## 2018-11-17 DIAGNOSIS — H8113 Benign paroxysmal vertigo, bilateral: Secondary | ICD-10-CM | POA: Diagnosis not present

## 2018-11-17 DIAGNOSIS — R2689 Other abnormalities of gait and mobility: Secondary | ICD-10-CM | POA: Diagnosis not present

## 2018-11-17 DIAGNOSIS — R42 Dizziness and giddiness: Secondary | ICD-10-CM | POA: Diagnosis not present

## 2018-11-17 DIAGNOSIS — R2681 Unsteadiness on feet: Secondary | ICD-10-CM | POA: Insufficient documentation

## 2018-11-17 NOTE — Therapy (Signed)
[facility] [address] [place], [place], [address] Phone: [phone]   Fax:  [phone]  Physical Therapy Treatment  Patient Details  Name: Doris [name] MRN: [id number] Date of Birth: [birth year] Referring Provider (PT): Audley Hose, MD   Encounter Date: 11/17/2018  PT End of Session - 11/17/18 1248    Visit Number  2    Number of Visits  13    Date for PT Re-Evaluation  12/26/18    Authorization Type  Medicare and Medicaid - 10th visit PN    PT Start Time  0850    PT Stop Time  0930    PT Time Calculation (min)  40 min    Activity Tolerance  Patient tolerated treatment well    Behavior During Therapy  Anxious       Past Medical History:  Diagnosis Date  . Acid reflux   . Back pain   . HLD (hyperlipidemia)   . Hypertension   . Insomnia   . Migraine   . Osteoarthritis    knee  . Psychosis ([place])    followed by Dr. Keenan Bachelor [phone]  . Thyroid nodule 2010   TSH 04/2008 = 0.296, no follow up noted in [facility]  . Vertigo     Past Surgical History:  Procedure Laterality Date  . TONSILLECTOMY    . UTERINE FIBROID SURGERY      There were no vitals filed for this visit.  Subjective Assessment - 11/17/18 0856    Subjective  Pt unable to report if dizziness has changed since eval and CRM.  "I'm still aware of my head but I'm sure what you did helped."  Still very focused on concussion.    Pertinent History  multi-canal BPPV, vertigo, thyroid nodule and hypothyroidism, OA, osteoporosis, depression with psychosis,tachycardia and palpitations, migraine, insomnia, HLD, back pain, HTN, coronary artery calcification, multiple falls with head injury, and latent TB    Limitations  Standing;Walking    Diagnostic tests  CT scan following most recent fall    Currently in Pain?  No/denies             Vestibular Assessment - 11/17/18 0903      Positional Testing   Dix-Hallpike  Dix-Hallpike Right       Dix-Hallpike Right   Dix-Hallpike Right Duration  15 seconds    Dix-Hallpike Right Symptoms  Upbeat, right rotatory nystagmus                Vestibular Treatment/Exercise - 11/17/18 0912      Vestibular Treatment/Exercise   Vestibular Treatment Provided  Canalith Repositioning    Canalith Repositioning  Epley Manuever Right       EPLEY MANUEVER RIGHT   Number of Reps   4    Overall Response  No change    Response Details   after each CRM pt continues to present with R, upbeating nystagmus of short duration.              PT Education - 11/17/18 1246    Education Details  education regarding healing time after concussion and importance of rest but not immobility and importance of aerobic conditioning after concussion; advised to sleep on L side and hold on habituation exercises for weekend    Person(s) Educated  Patient    Methods  Explanation    Comprehension  Verbalized understanding       PT Short Term Goals - 10/28/18 1307      PT  SHORT TERM GOAL #1   Title  Pt will demonstrate independence with initial HEP    Time  4    Period  Weeks    Status  New    Target Date  11/27/18      PT SHORT TERM GOAL #2   Title  Pt will participate in further assessment of balance and falls risk    Baseline  gait velocity, FGA TBA    Time  4    Period  Weeks    Status  New    Target Date  11/27/18      PT SHORT TERM GOAL #3   Title  Pt will tolerate canalith repositioning manuevers for L and R side with resolution of symptoms    Time  4    Period  Weeks    Status  New    Target Date  11/27/18      PT SHORT TERM GOAL #4   Title  Pt will participate in further assessment of vestibular impairments with MSQ or DVA    Time  4    Period  Weeks    Status  New    Target Date  11/27/18        PT Long Term Goals - 10/28/18 1309      PT LONG TERM GOAL #1   Title  Pt will demonstrate independence with vestibular and balance HEP    Time  8    Period  Weeks     Status  New    Target Date  12/27/18      PT LONG TERM GOAL #2   Title  Pt will report no symptoms of vertigo or motion sensitivity during bed mobility, ambulation and ADLs.    Baseline  MSQ TBA    Time  8    Period  Weeks    Status  New    Target Date  12/27/18      PT LONG TERM GOAL #3   Title  Pt will demonstrate decreased falls risk as indicated by improvement in FGA by 4 points and gait velocity >2.62 ft/sec    Baseline  TBA for FGA and gait velocity    Time  8    Period  Weeks    Status  New    Target Date  12/27/18      PT LONG TERM GOAL #4   Title  Pt will demonstrate improved use of VOR as indicated by improvement in DVA to 2-3 line difference    Baseline  TBA    Time  8    Period  Weeks    Status  New    Target Date  12/27/18      PT LONG TERM GOAL #5   Title  Pt will demonstrate negative positional testing for R and L side    Time  8    Period  Weeks    Status  New    Target Date  12/27/18            Plan - 11/17/18 1248    Clinical Impression Statement  Pt is demonstrating improvement in dizziness and appears to only have R posterior canal BPPV but after 4 reps CRM unable to completely clear R posterior canal.  Advised pt to hold on habituation exercises this weekend until reassessed by therapist on Monday.  Pt tolerated well without nausea or vomiting and no significant dizziness at end of session. Will continue to address and treat as  indicated.    Personal Factors and Comorbidities  Comorbidity 3+;Past/Current Experience;Time since onset of injury/illness/exacerbation;Transportation;Social Background    Comorbidities  multi-canal BPPV, vertigo, thyroid nodule and hypothyroidism, OA, osteoporosis, depression with psychosis,tachycardia and palpitations, migraine, insomnia, HLD, back pain, HTN, coronary artery calcification, multiple falls with head injury, and latent TB    Examination-Activity Limitations  Bed Mobility;Locomotion  Level;Stairs;Transfers;Stand;Bend    Examination-Participation Restrictions  Driving;Community Activity;Cleaning    Stability/Clinical Decision Making  Evolving/Moderate complexity    Rehab Potential  Good    PT Frequency  2x / week    PT Duration  8 weeks    PT Treatment/Interventions  ADLs/Self Care Home Management;Canalith Repostioning;Gait training;Stair training;Functional mobility training;Therapeutic activities;Therapeutic exercise;Balance training;Neuromuscular re-education;Patient/family education;Vestibular;Passive range of motion;Manual techniques;Moist Heat;Cryotherapy;Dry needling    PT Next Visit Plan  re-assess R BPPV and treat as indicated and then address L (downbeating - long arm of posterior canal vs. anterior?); perform balance assessment - FGA/gait velocity, MSQ.  Initiate balance and vestibular HEP.  If still having headaches assess neck ROM/proprioception.    PT Home Exercise Plan  cervical strengthening/proprioception, vestibular, balance    Consulted and Agree with Plan of Care  Patient       Patient will benefit from skilled therapeutic intervention in order to improve the following deficits and impairments:  Decreased balance, Difficulty walking, Dizziness, Pain, Decreased activity tolerance, Decreased range of motion  Visit Diagnosis: BPPV (benign paroxysmal positional vertigo), bilateral  Dizziness and giddiness  Unsteadiness on feet     Problem List Patient Active Problem List   Diagnosis Date Noted  . Impaired cognition 05/03/2018  . Vitamin D deficiency 12/08/2016  . Overweight 10/27/2016  . Postablative hypothyroidism 10/27/2016  . Prediabetes 10/27/2016  . Muscle spasms of neck 08/26/2016  . Abnormality of gait 08/10/2016  . Osteoporosis 10/13/2015  . Acrochordon 07/07/2015  . Jaw pain 12/30/2014  . Coronary artery calcification 12/23/2014  . Tachycardia 08/27/2014  . Cholelithiasis 05/17/2014  . GERD (gastroesophageal reflux disease)  01/02/2013  . TB lung, latent 05/01/2012  . Osteoarthritis 03/01/2012  . Severe major depression with psychotic features ([place]) 04/01/2011  . Hypothyroidism (acquired) 03/22/2011  . Healthcare maintenance 12/28/2010  . BPPV (benign paroxysmal positional vertigo) 11/19/2008  . Hyperlipidemia 11/30/2005  . Essential hypertension 11/30/2005  . LOW BACK PAIN 11/30/2005    Rico Junker, PT, DPT 11/17/18    12:52 PM    [facility] [address] [place], [place], [address] Phone: [phone]   Fax:  [phone]  Name: Doris [name] MRN: [id number] Date of Birth: [birth year]

## 2018-11-17 NOTE — Patient Instructions (Signed)
Sleep only on your left side for the weekend and hold on the exercises for the weekend until we re-check on Monday

## 2018-11-20 ENCOUNTER — Encounter: Payer: Self-pay | Admitting: Physical Therapy

## 2018-11-20 ENCOUNTER — Other Ambulatory Visit: Payer: Self-pay

## 2018-11-20 ENCOUNTER — Ambulatory Visit: Payer: Medicare Other | Admitting: Physical Therapy

## 2018-11-20 DIAGNOSIS — R2689 Other abnormalities of gait and mobility: Secondary | ICD-10-CM | POA: Diagnosis not present

## 2018-11-20 DIAGNOSIS — H8113 Benign paroxysmal vertigo, bilateral: Secondary | ICD-10-CM

## 2018-11-20 DIAGNOSIS — R42 Dizziness and giddiness: Secondary | ICD-10-CM

## 2018-11-20 DIAGNOSIS — R2681 Unsteadiness on feet: Secondary | ICD-10-CM | POA: Diagnosis not present

## 2018-11-20 NOTE — Patient Instructions (Signed)
Continue to sleep on your LEFT side for a couple more nights.  Gaze Stabilization: Sitting    Keeping eyes on target on wall 3 feet away, and move head side to side for _30___ seconds. Repeat while moving head up and down for __30__ seconds. Do __2__ sessions per day.    Gaze Stabilization: Tip Card  1.Target must remain in focus, not blurry, and appear stationary while head is in motion. 2.Perform exercises with small head movements (45 to either side of midline). 3.Increase speed of head motion so long as target is in focus. 4.If you wear eyeglasses, be sure you can see target through lens (therapist will give specific instructions for bifocal / progressive lenses). 5.These exercises may provoke dizziness or nausea. Work through these symptoms. If too dizzy, slow head movement slightly. Rest between each exercise. 6.Exercises demand concentration; avoid distractions.  Copyright  VHI. All rights reserved.

## 2018-11-21 ENCOUNTER — Encounter: Payer: Self-pay | Admitting: Physical Therapy

## 2018-11-21 NOTE — Therapy (Signed)
[facility] [address] [place], [place], [address] Phone: [phone]   Fax:  [phone]  Physical Therapy Treatment  Patient Details  Name: Doris [name] MRN: [id number] Date of Birth: 1945-07-23 Referring Provider (PT): Audley Hose, MD   Encounter Date: 11/20/2018  PT End of Session - 11/21/18 1156    Visit Number  3    Number of Visits  13    Date for PT Re-Evaluation  12/26/18    Authorization Type  Medicare and Medicaid - 10th visit PN    PT Start Time  1708    PT Stop Time  1747    PT Time Calculation (min)  39 min    Activity Tolerance  Patient tolerated treatment well    Behavior During Therapy  Anxious       Past Medical History:  Diagnosis Date  . Acid reflux   . Back pain   . HLD (hyperlipidemia)   . Hypertension   . Insomnia   . Migraine   . Osteoarthritis    knee  . Psychosis ([place])    followed by Dr. Keenan Bachelor [phone]  . Thyroid nodule 2010   TSH 04/2008 = 0.296, no follow up noted in [facility]  . Vertigo     Past Surgical History:  Procedure Laterality Date  . TONSILLECTOMY    . UTERINE FIBROID SURGERY      There were no vitals filed for this visit.  Subjective Assessment - 11/20/18 1711    Subjective  Slept on the L side.  Went walking this morning.  Feeling great today.  No headaches.    Pertinent History  multi-canal BPPV, vertigo, thyroid nodule and hypothyroidism, OA, osteoporosis, depression with psychosis,tachycardia and palpitations, migraine, insomnia, HLD, back pain, HTN, coronary artery calcification, multiple falls with head injury, and latent TB    Limitations  Standing;Walking    Diagnostic tests  CT scan following most recent fall    Currently in Pain?  No/denies             Vestibular Assessment - 11/20/18 1712      Positional Testing   Dix-Hallpike  Dix-Hallpike Right      Dix-Hallpike Right   Dix-Hallpike Right Duration  10 seconds     Dix-Hallpike Right Symptoms  Upbeat, right rotatory nystagmus      Horizontal Canal Right   Horizontal Canal Right Duration  0    Horizontal Canal Right Symptoms  Normal      Horizontal Canal Left   Horizontal Canal Left Duration  0    Horizontal Canal Left Symptoms  Normal                Vestibular Treatment/Exercise - 11/20/18 1721      Vestibular Treatment/Exercise   Vestibular Treatment Provided  Canalith Repositioning;Gaze    Canalith Repositioning  Epley Manuever Right    Gaze Exercises  X1 Viewing Horizontal;X1 Viewing Vertical       EPLEY MANUEVER RIGHT   Number of Reps   3    Overall Response  Improved Symptoms    Response Details   When coming upright after second CRM pt felt sudden retropulsion backwards; tested R posterior canal again with mitigation of R, upbeating nystagmus.  Did exhibit mild downbeat nystagmus - performed deep head hanging manuever for third CRM      X1 Viewing Horizontal   Foot Position  seated without back support    Reps  2    Comments  30 seconds      X1 Viewing Vertical   Foot Position  seated without back support    Reps  2    Comments  30 seconds            PT Education - 11/21/18 1155    Education Details  continued to advise pt to sleep on L side; initiated gaze stabilization training    Person(s) Educated  Patient    Methods  Explanation;Demonstration;Handout    Comprehension  Verbalized understanding;Returned demonstration      Continue to sleep on your LEFT side for a couple more nights.  Gaze Stabilization: Sitting    Keeping eyes on target on wall 3 feet away, and move head side to side for _30___ seconds. Repeat while moving head up and down for __30__ seconds. Do __2__ sessions per day.    Gaze Stabilization: Tip Card  1.Target must remain in focus, not blurry, and appear stationary while head is in motion. 2.Perform exercises with small head movements (45 to either side of midline). 3.Increase  speed of head motion so long as target is in focus. 4.If you wear eyeglasses, be sure you can see target through lens (therapist will give specific instructions for bifocal / progressive lenses). 5.These exercises may provoke dizziness or nausea. Work through these symptoms. If too dizzy, slow head movement slightly. Rest between each exercise. 6.Exercises demand concentration; avoid distractions.  Copyright  VHI. All rights reserved.     PT Short Term Goals - 10/28/18 1307      PT SHORT TERM GOAL #1   Title  Pt will demonstrate independence with initial HEP    Time  4    Period  Weeks    Status  New    Target Date  11/27/18      PT SHORT TERM GOAL #2   Title  Pt will participate in further assessment of balance and falls risk    Baseline  gait velocity, FGA TBA    Time  4    Period  Weeks    Status  New    Target Date  11/27/18      PT SHORT TERM GOAL #3   Title  Pt will tolerate canalith repositioning manuevers for L and R side with resolution of symptoms    Time  4    Period  Weeks    Status  New    Target Date  11/27/18      PT SHORT TERM GOAL #4   Title  Pt will participate in further assessment of vestibular impairments with MSQ or DVA    Time  4    Period  Weeks    Status  New    Target Date  11/27/18        PT Long Term Goals - 10/28/18 1309      PT LONG TERM GOAL #1   Title  Pt will demonstrate independence with vestibular and balance HEP    Time  8    Period  Weeks    Status  New    Target Date  12/27/18      PT LONG TERM GOAL #2   Title  Pt will report no symptoms of vertigo or motion sensitivity during bed mobility, ambulation and ADLs.    Baseline  MSQ TBA    Time  8    Period  Weeks    Status  New    Target Date  12/27/18  PT LONG TERM GOAL #3   Title  Pt will demonstrate decreased falls risk as indicated by improvement in FGA by 4 points and gait velocity >2.62 ft/sec    Baseline  TBA for FGA and gait velocity    Time  8    Period   Weeks    Status  New    Target Date  12/27/18      PT LONG TERM GOAL #4   Title  Pt will demonstrate improved use of VOR as indicated by improvement in DVA to 2-3 line difference    Baseline  TBA    Time  8    Period  Weeks    Status  New    Target Date  12/27/18      PT LONG TERM GOAL #5   Title  Pt will demonstrate negative positional testing for R and L side    Time  8    Period  Weeks    Status  New    Target Date  12/27/18            Plan - 11/21/18 1156    Clinical Impression Statement  Despite improvement in symptoms at home pt continued to present with R, upbeating nystagmus of short duration in R hallpike-dix position.  Performed CRM x 2 and then followed with deep head hanging.  During final R hallpike-dix pt did not demonstrate any nystagmus or vertigo indicating possible mitigation of R posterior canal BPPV.  Initiated gaze stabilization exercises in sitting; pt tolerated well.  Will continue to address and treat vestibular impairments as needed to decrease falls risk and improve functional mobility independence.    Personal Factors and Comorbidities  Comorbidity 3+;Past/Current Experience;Time since onset of injury/illness/exacerbation;Transportation;Social Background    Comorbidities  multi-canal BPPV, vertigo, thyroid nodule and hypothyroidism, OA, osteoporosis, depression with psychosis,tachycardia and palpitations, migraine, insomnia, HLD, back pain, HTN, coronary artery calcification, multiple falls with head injury, and latent TB    Examination-Activity Limitations  Bed Mobility;Locomotion Level;Stairs;Transfers;Stand;Bend    Examination-Participation Restrictions  Driving;Community Activity;Cleaning    Stability/Clinical Decision Making  Evolving/Moderate complexity    Rehab Potential  Good    PT Frequency  2x / week    PT Duration  8 weeks    PT Treatment/Interventions  ADLs/Self Care Home Management;Canalith Repostioning;Gait training;Stair  training;Functional mobility training;Therapeutic activities;Therapeutic exercise;Balance training;Neuromuscular re-education;Patient/family education;Vestibular;Passive range of motion;Manual techniques;Moist Heat;Cryotherapy;Dry needling    PT Next Visit Plan  re-assess R BPPV and treat as indicated.  Assess FGA/gait velocity; progress VOR and add to HEP for balance.  If still having headaches assess neck ROM/proprioception.    PT Home Exercise Plan  cervical strengthening/proprioception, vestibular, balance    Consulted and Agree with Plan of Care  Patient       Patient will benefit from skilled therapeutic intervention in order to improve the following deficits and impairments:  Decreased balance, Difficulty walking, Dizziness, Pain, Decreased activity tolerance, Decreased range of motion  Visit Diagnosis: BPPV (benign paroxysmal positional vertigo), bilateral  Dizziness and giddiness  Unsteadiness on feet  Other abnormalities of gait and mobility     Problem List Patient Active Problem List   Diagnosis Date Noted  . Impaired cognition 05/03/2018  . Vitamin D deficiency 12/08/2016  . Overweight 10/27/2016  . Postablative hypothyroidism 10/27/2016  . Prediabetes 10/27/2016  . Muscle spasms of neck 08/26/2016  . Abnormality of gait 08/10/2016  . Osteoporosis 10/13/2015  . Acrochordon 07/07/2015  . Jaw pain 12/30/2014  . Coronary  artery calcification 12/23/2014  . Tachycardia 08/27/2014  . Cholelithiasis 05/17/2014  . GERD (gastroesophageal reflux disease) 01/02/2013  . TB lung, latent 05/01/2012  . Osteoarthritis 03/01/2012  . Severe major depression with psychotic features ([place]) 04/01/2011  . Hypothyroidism (acquired) 03/22/2011  . Healthcare maintenance 12/28/2010  . BPPV (benign paroxysmal positional vertigo) 11/19/2008  . Hyperlipidemia 11/30/2005  . Essential hypertension 11/30/2005  . LOW BACK PAIN 11/30/2005    Rico Junker, PT, DPT 11/21/18    12:02 PM     [facility] [address] [place], [place], [address] Phone: [phone]   Fax:  [phone]  Name: Doris [name] MRN: [id number] Date of Birth: 1945-09-12

## 2018-11-24 ENCOUNTER — Other Ambulatory Visit: Payer: Self-pay

## 2018-11-24 ENCOUNTER — Ambulatory Visit: Payer: Medicare Other | Admitting: Physical Therapy

## 2018-11-24 ENCOUNTER — Encounter: Payer: Self-pay | Admitting: Physical Therapy

## 2018-11-24 DIAGNOSIS — R42 Dizziness and giddiness: Secondary | ICD-10-CM

## 2018-11-24 DIAGNOSIS — R2681 Unsteadiness on feet: Secondary | ICD-10-CM | POA: Diagnosis not present

## 2018-11-24 DIAGNOSIS — H8113 Benign paroxysmal vertigo, bilateral: Secondary | ICD-10-CM | POA: Diagnosis not present

## 2018-11-24 DIAGNOSIS — R2689 Other abnormalities of gait and mobility: Secondary | ICD-10-CM

## 2018-11-24 NOTE — Patient Instructions (Signed)
Gaze Stabilization - Tip Card  1.Target must remain in focus, not blurry, and appear stationary while head is in motion. 2.Perform exercises with small head movements (45 to either side of midline). 3.Increase speed of head motion so long as target is in focus. 4.If you wear eyeglasses, be sure you can see target through lens (therapist will give specific instructions for bifocal / progressive lenses). 5.These exercises may provoke dizziness or nausea. Work through these symptoms. If too dizzy, slow head movement slightly. Rest between each exercise. 6.Exercises demand concentration; avoid distractions. 7.For safety, perform standing exercises close to a counter, wall, corner, or next to someone.  Copyright  VHI. All rights reserved.   Gaze Stabilization - Standing Feet Apart   Feet shoulder width apart, keeping eyes on target on wall 3 feet away, tilt head down slightly and move head side to side for 60 seconds. Repeat while moving head up and down for 60 seconds.  Try to keep your body from moving up/down or side to side.   Do 2-3 sessions per day.   Feet Together - Eyes Closed      With eyes closed and feet together.  Feel the weight in the middle of your feet.  Close your eyes and hold your balance in the middle for 20 seconds Repeat 3 times per session. Do 2 sessions per day.    Feet Partial Heel-Toe, Head Motion - Eyes Open    With eyes open, right foot partially in front of the other, move head slowly: side to side 10 times.  Switch feet left foot forwards - head side to side 10 times. Return to right foot forwards and perform 10 head nods: up and down.  Switch feet and repeat head up and down 10 times.  Do 2 sessions per day.

## 2018-11-25 IMAGING — DX DG CHEST 2V
2 series · 2 of 2 positions shown · non-contrast
Comparison: 04/02/2014.

CLINICAL DATA: Increasing shortness of breath and edema in legs,
nonsmoker.

EXAM:
CHEST  2 VIEW

[dg chest 2 view (1 of 2)]
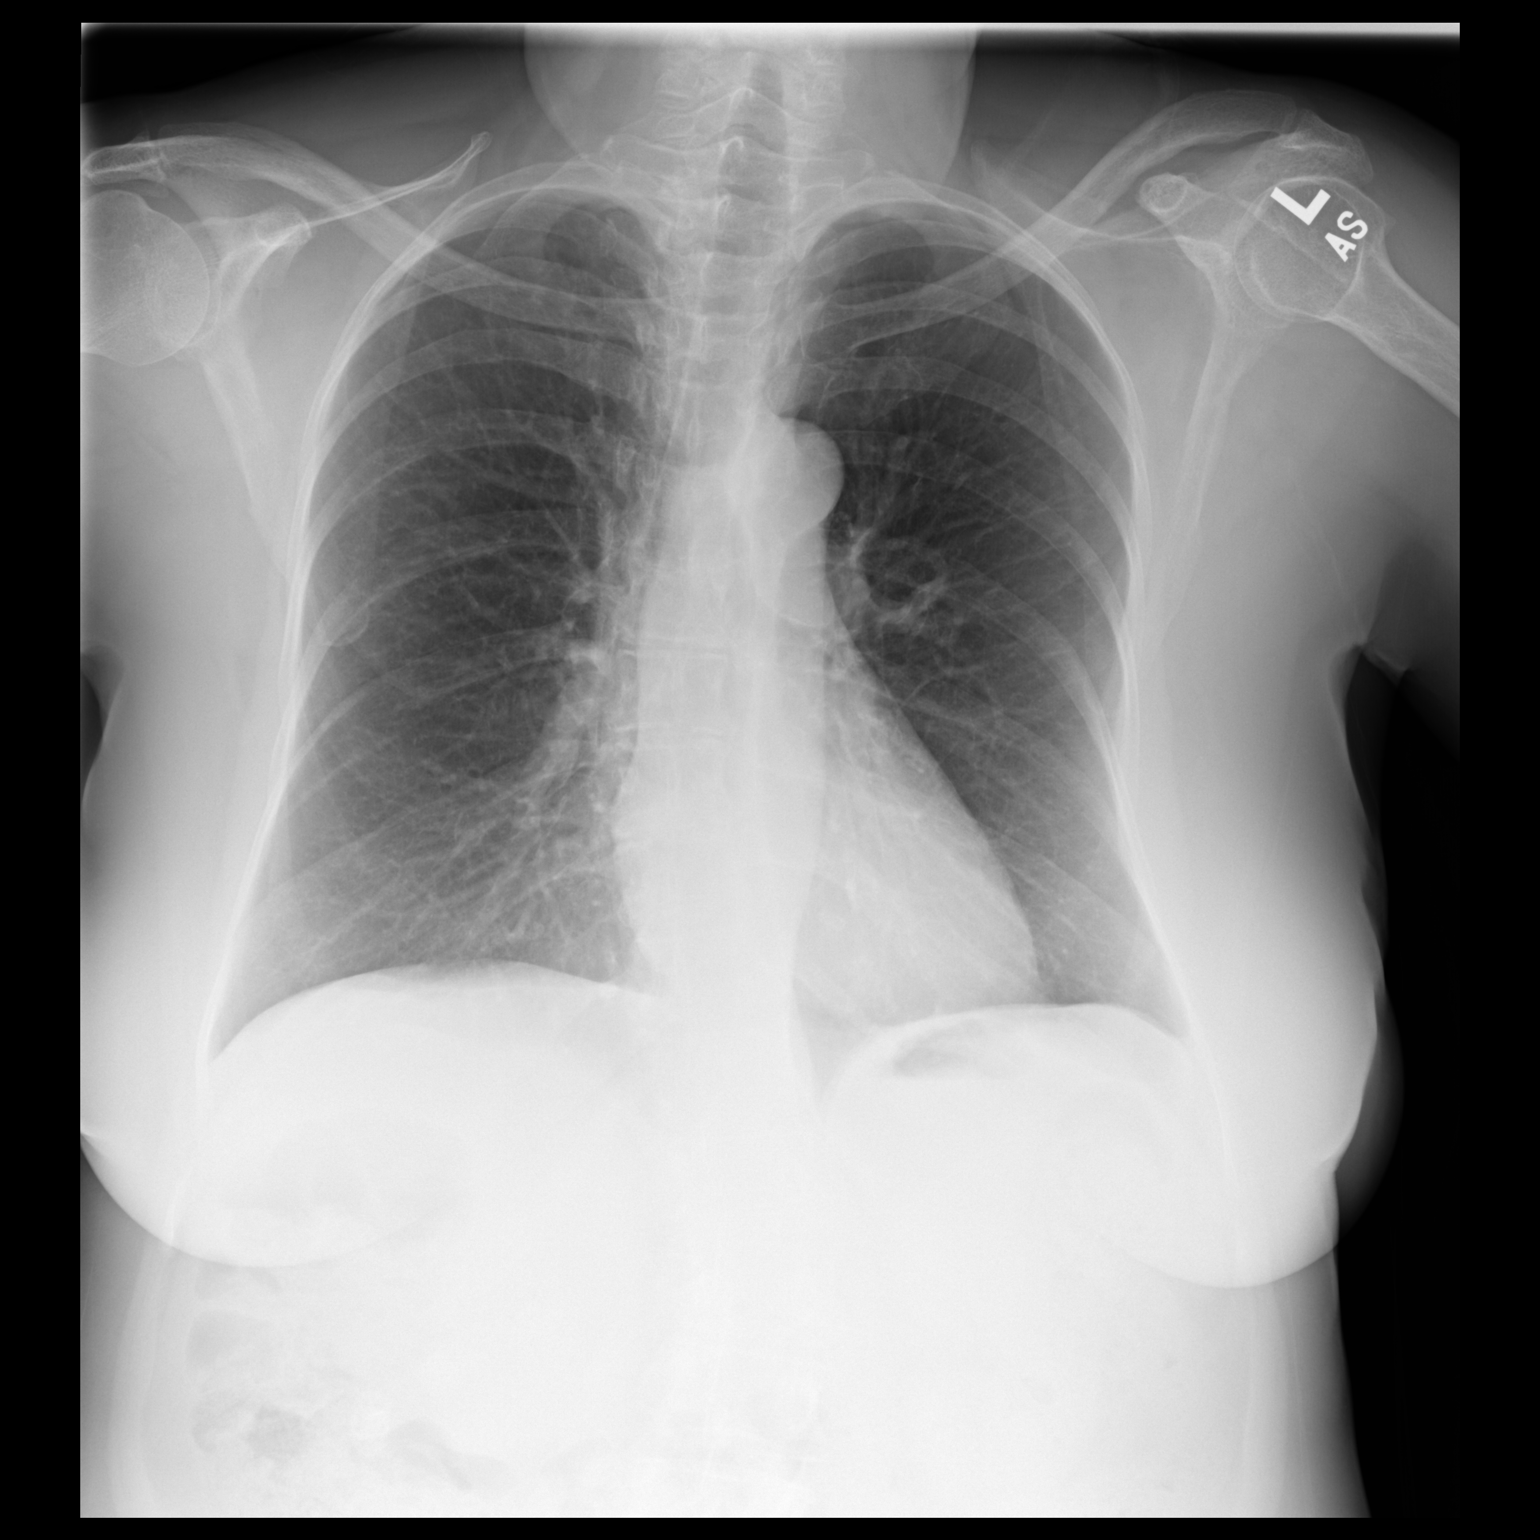

[dg chest 2 view (2 of 2)]
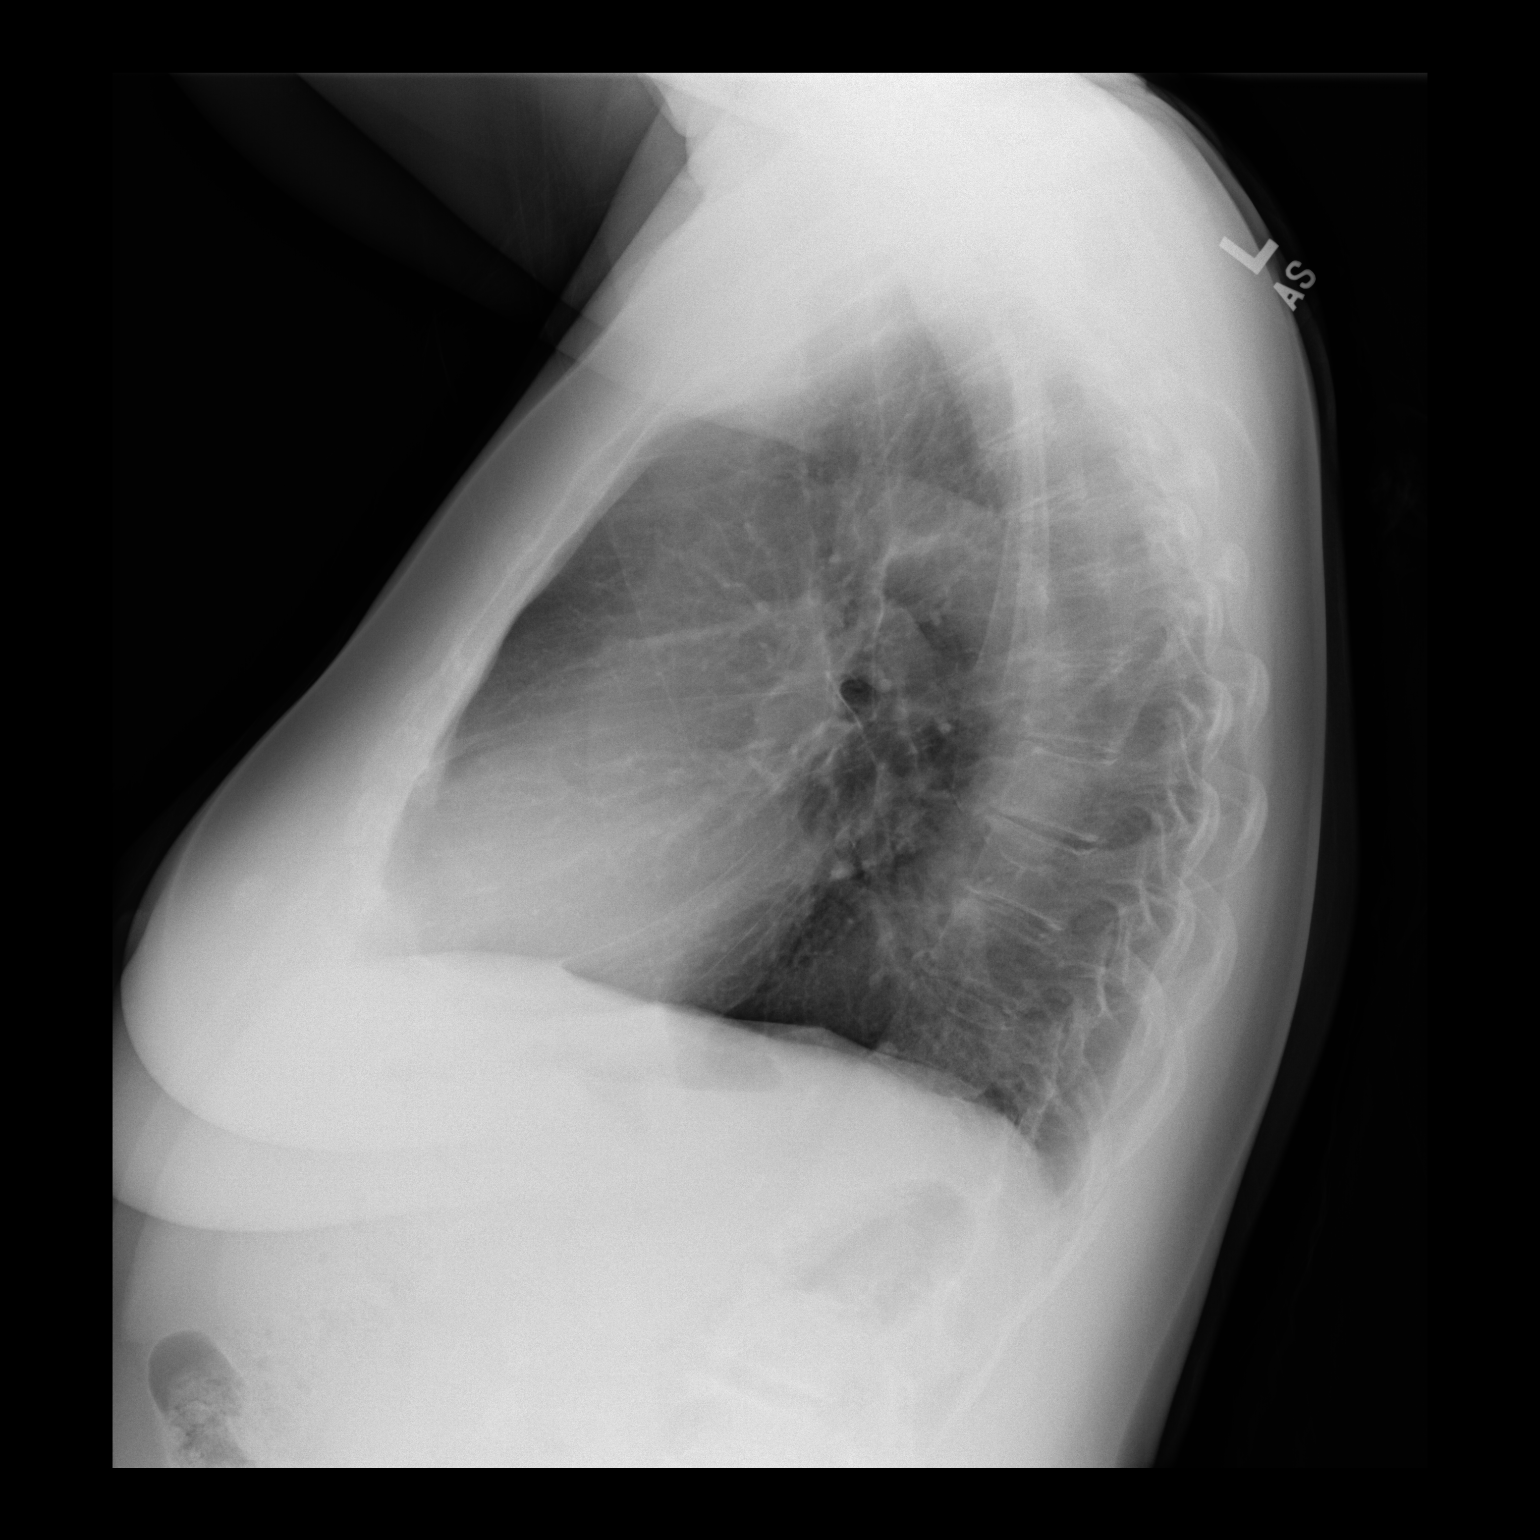

[2 of 2 positions shown; findings below may reference images not displayed]

FINDINGS: The heart size and mediastinal contours are within normal limits.
Both lungs are clear. The visualized skeletal structures are
unremarkable.
IMPRESSION: No active cardiopulmonary disease. No significant change from
priors.

## 2018-11-25 NOTE — Therapy (Signed)
[facility] [address] [place], [place], [address] Phone: [phone]   Fax:  [phone]  Physical Therapy Treatment  Patient Details  Name: Doris [name] MRN: [id number] Date of Birth: 27-Mar-1945 Referring Provider (PT): Audley Hose, MD   Encounter Date: 11/24/2018  PT End of Session - 11/25/18 1150    Visit Number  4    Number of Visits  13    Date for PT Re-Evaluation  12/26/18    Authorization Type  Medicare and Medicaid - 10th visit PN    PT Start Time  0801    PT Stop Time  0845    PT Time Calculation (min)  44 min    Activity Tolerance  Patient tolerated treatment well    Behavior During Therapy  [facility] for tasks assessed/performed       Past Medical History:  Diagnosis Date  . Acid reflux   . Back pain   . HLD (hyperlipidemia)   . Hypertension   . Insomnia   . Migraine   . Osteoarthritis    knee  . Psychosis ([place])    followed by Dr. Keenan Bachelor [phone]  . Thyroid nodule 2010   TSH 04/2008 = 0.296, no follow up noted in [facility]  . Vertigo     Past Surgical History:  Procedure Laterality Date  . TONSILLECTOMY    . UTERINE FIBROID SURGERY      There were no vitals filed for this visit.  Subjective Assessment - 11/24/18 0804    Subjective  Dizziness and headaches have diminished.    Pertinent History  multi-canal BPPV, vertigo, thyroid nodule and hypothyroidism, OA, osteoporosis, depression with psychosis,tachycardia and palpitations, migraine, insomnia, HLD, back pain, HTN, coronary artery calcification, multiple falls with head injury, and latent TB    Limitations  Standing;Walking    Diagnostic tests  CT scan following most recent fall    Currently in Pain?  No/denies         [facility] PT Assessment - 11/24/18 0806      Functional Gait  Assessment   Gait assessed   Yes    Gait Level Surface  Walks 20 ft in less than 7 sec but greater than 5.5 sec, uses assistive device, slower  speed, mild gait deviations, or deviates 6-10 in outside of the 12 in walkway width.    Change in Gait Speed  Able to change speed, demonstrates mild gait deviations, deviates 6-10 in outside of the 12 in walkway width, or no gait deviations, unable to achieve a major change in velocity, or uses a change in velocity, or uses an assistive device.    Gait with Horizontal Head Turns  Performs head turns smoothly with slight change in gait velocity (eg, minor disruption to smooth gait path), deviates 6-10 in outside 12 in walkway width, or uses an assistive device.    Gait with Vertical Head Turns  Performs task with slight change in gait velocity (eg, minor disruption to smooth gait path), deviates 6 - 10 in outside 12 in walkway width or uses assistive device    Gait and Pivot Turn  Pivot turns safely within 3 sec and stops quickly with no loss of balance.    Step Over Obstacle  Is able to step over one shoe box (4.5 in total height) without changing gait speed. No evidence of imbalance.    Gait with Narrow Base of Support  Ambulates less than 4 steps heel to toe or cannot perform without  assistance.    Gait with Eyes Closed  Walks 20 ft, uses assistive device, slower speed, mild gait deviations, deviates 6-10 in outside 12 in walkway width. Ambulates 20 ft in less than 9 sec but greater than 7 sec.    Ambulating Backwards  Walks 20 ft, uses assistive device, slower speed, mild gait deviations, deviates 6-10 in outside 12 in walkway width.    Steps  Alternating feet, must use rail.    Total Score  19    FGA comment:  19/30                    Vestibular Treatment/Exercise - 11/24/18 0818      Vestibular Treatment/Exercise   Vestibular Treatment Provided  Gaze    Gaze Exercises  X1 Viewing Horizontal;X1 Viewing Vertical      X1 Viewing Horizontal   Foot Position  standing feet apart, first without UE support but having to add UE support due to LOB and pt demonstrating decreased head  movement, more whole body movement.  Cues for greater range of movement    Reps  3    Comments  30 seconds > 60 seconds      X1 Viewing Vertical   Foot Position  standing feet apart, first without UE support but having to add UE support due to LOB and pt demonstrating decreased head movement, more whole body movement.  Cues for greater range of movement    Reps  3    Comments  30 seconds > 60 seconds.  MIld sway posteriorly         Balance Exercises - 11/24/18 0844      Balance Exercises: Standing   Standing Eyes Opened  Narrow base of support (BOS);Head turns;Solid surface;Other reps (comment)   10 reps head turns, partial tandem   Standing Eyes Closed  Narrow base of support (BOS);Solid surface;3 reps;20 secs       Gaze Stabilization - Tip Card  1.Target must remain in focus, not blurry, and appear stationary while head is in motion. 2.Perform exercises with small head movements (45 to either side of midline). 3.Increase speed of head motion so long as target is in focus. 4.If you wear eyeglasses, be sure you can see target through lens (therapist will give specific instructions for bifocal / progressive lenses). 5.These exercises may provoke dizziness or nausea. Work through these symptoms. If too dizzy, slow head movement slightly. Rest between each exercise. 6.Exercises demand concentration; avoid distractions. 7.For safety, perform standing exercises close to a counter, wall, corner, or next to someone.  Copyright  VHI. All rights reserved.   Gaze Stabilization - Standing Feet Apart   Feet shoulder width apart, keeping eyes on target on wall 3 feet away, tilt head down slightly and move head side to side for 60 seconds. Repeat while moving head up and down for 60 seconds.  Try to keep your body from moving up/down or side to side.   Do 2-3 sessions per day.   Feet Together - Eyes Closed      With eyes closed and feet together.  Feel the weight in the middle of  your feet.  Close your eyes and hold your balance in the middle for 20 seconds Repeat 3 times per session. Do 2 sessions per day.    Feet Partial Heel-Toe, Head Motion - Eyes Open    With eyes open, right foot partially in front of the other, move head slowly: side to side 10 times.  Switch feet left foot forwards - head side to side 10 times. Return to right foot forwards and perform 10 head nods: up and down.  Switch feet and repeat head up and down 10 times.  Do 2 sessions per day.         PT Education - 11/25/18 1149    Education Details  progressed HEP    Person(s) Educated  Patient    Methods  Explanation;Demonstration;Handout    Comprehension  Verbalized understanding;Returned demonstration       PT Short Term Goals - 11/25/18 1151      PT SHORT TERM GOAL #1   Title  Pt will demonstrate independence with initial HEP    Time  4    Period  Weeks    Status  On-going    Target Date  11/27/18      PT SHORT TERM GOAL #2   Title  Pt will participate in further assessment of balance and falls risk    Time  4    Period  Weeks    Status  Achieved    Target Date  11/27/18      PT SHORT TERM GOAL #3   Title  Pt will tolerate canalith repositioning manuevers for L and R side with resolution of symptoms    Time  4    Period  Weeks    Status  On-going    Target Date  11/27/18      PT SHORT TERM GOAL #4   Title  Pt will participate in further assessment of vestibular impairments with MSQ or DVA    Time  4    Period  Weeks    Status  On-going    Target Date  11/27/18        PT Long Term Goals - 11/25/18 1155      PT LONG TERM GOAL #1   Title  Pt will demonstrate independence with vestibular and balance HEP  (LTG due 12/27/2018)    Time  8    Period  Weeks    Status  New      PT LONG TERM GOAL #2   Title  Pt will report no symptoms of vertigo or motion sensitivity during bed mobility, ambulation and ADLs.    Baseline  MSQ TBA    Time  8    Period  Weeks     Status  New      PT LONG TERM GOAL #3   Title  Pt will demonstrate decreased falls risk as indicated by improvement in FGA by 4 points    Baseline  19/30    Time  8    Period  Weeks    Status  Revised      PT LONG TERM GOAL #4   Title  Pt will demonstrate improved use of VOR as indicated by improvement in DVA to 2-3 line difference    Baseline  TBA    Time  8    Period  Weeks    Status  New      PT LONG TERM GOAL #5   Title  Pt will demonstrate negative positional testing for R and L side    Time  8    Period  Weeks    Status  New            Plan - 11/25/18 1152    Clinical Impression Statement  Pt is reporting improvement in symptoms of dizziness and headaches but continues to  report intermittent "pull" to one side when ambulating.  Performed assessment of balance during gait and falls risk with FGA; discussed findings with patient.  Continued to progress x1 viewing to standing with increased evidence of imbalance.  Added corner balance for multi sensory balance training.  Pt required significant cues to sequence exercises correctly.  Will continue to address in order to progress towards LTG.    Personal Factors and Comorbidities  Comorbidity 3+;Past/Current Experience;Time since onset of injury/illness/exacerbation;Transportation;Social Background    Comorbidities  multi-canal BPPV, vertigo, thyroid nodule and hypothyroidism, OA, osteoporosis, depression with psychosis,tachycardia and palpitations, migraine, insomnia, HLD, back pain, HTN, coronary artery calcification, multiple falls with head injury, and latent TB    Examination-Activity Limitations  Bed Mobility;Locomotion Level;Stairs;Transfers;Stand;Bend    Examination-Participation Restrictions  Driving;Community Activity;Cleaning    Stability/Clinical Decision Making  Evolving/Moderate complexity    Rehab Potential  Good    PT Frequency  2x / week    PT Duration  8 weeks    PT Treatment/Interventions  ADLs/Self Care  Home Management;Canalith Repostioning;Gait training;Stair training;Functional mobility training;Therapeutic activities;Therapeutic exercise;Balance training;Neuromuscular re-education;Patient/family education;Vestibular;Passive range of motion;Manual techniques;Moist Heat;Cryotherapy;Dry needling    PT Next Visit Plan  Check STG; assess DVA/MSQ.  Continue to treat BPPV if needed.  Progress HEP  If still having headaches assess neck ROM/proprioception.    PT Home Exercise Plan  cervical strengthening/proprioception, vestibular, balance    Consulted and Agree with Plan of Care  Patient       Patient will benefit from skilled therapeutic intervention in order to improve the following deficits and impairments:  Decreased balance, Difficulty walking, Dizziness, Pain, Decreased activity tolerance, Decreased range of motion  Visit Diagnosis: Dizziness and giddiness  Unsteadiness on feet  Other abnormalities of gait and mobility     Problem List Patient Active Problem List   Diagnosis Date Noted  . Impaired cognition 05/03/2018  . Vitamin D deficiency 12/08/2016  . Overweight 10/27/2016  . Postablative hypothyroidism 10/27/2016  . Prediabetes 10/27/2016  . Muscle spasms of neck 08/26/2016  . Abnormality of gait 08/10/2016  . Osteoporosis 10/13/2015  . Acrochordon 07/07/2015  . Jaw pain 12/30/2014  . Coronary artery calcification 12/23/2014  . Tachycardia 08/27/2014  . Cholelithiasis 05/17/2014  . GERD (gastroesophageal reflux disease) 01/02/2013  . TB lung, latent 05/01/2012  . Osteoarthritis 03/01/2012  . Severe major depression with psychotic features ([place]) 04/01/2011  . Hypothyroidism (acquired) 03/22/2011  . Healthcare maintenance 12/28/2010  . BPPV (benign paroxysmal positional vertigo) 11/19/2008  . Hyperlipidemia 11/30/2005  . Essential hypertension 11/30/2005  . LOW BACK PAIN 11/30/2005    Rico Junker, PT, DPT 11/25/18    11:58 AM    [facility] [address] [place], [place], [address] Phone: [phone]   Fax:  [phone]  Name: Doris [name] MRN: [id number] Date of Birth: Mar 23, 1945

## 2018-11-27 ENCOUNTER — Ambulatory Visit: Payer: Medicare Other | Admitting: Physical Therapy

## 2018-11-27 ENCOUNTER — Encounter: Payer: Self-pay | Admitting: Physical Therapy

## 2018-11-27 ENCOUNTER — Other Ambulatory Visit: Payer: Self-pay

## 2018-11-27 DIAGNOSIS — R42 Dizziness and giddiness: Secondary | ICD-10-CM | POA: Diagnosis not present

## 2018-11-27 DIAGNOSIS — R2681 Unsteadiness on feet: Secondary | ICD-10-CM | POA: Diagnosis not present

## 2018-11-27 DIAGNOSIS — R2689 Other abnormalities of gait and mobility: Secondary | ICD-10-CM | POA: Diagnosis not present

## 2018-11-27 DIAGNOSIS — H8113 Benign paroxysmal vertigo, bilateral: Secondary | ICD-10-CM | POA: Diagnosis not present

## 2018-11-27 NOTE — Patient Instructions (Signed)
Gaze Stabilization - Tip Card  1.Target must remain in focus, not blurry, and appear stationary while head is in motion. 2.Perform exercises with small head movements (45 to either side of midline). 3.Increase speed of head motion so long as target is in focus. 4.If you wear eyeglasses, be sure you can see target through lens (therapist will give specific instructions for bifocal / progressive lenses). 5.These exercises may provoke dizziness or nausea. Work through these symptoms. If too dizzy, slow head movement slightly. Rest between each exercise. 6.Exercises demand concentration; avoid distractions. 7.For safety, perform standing exercises close to a counter, wall, corner, or next to someone.  Copyright  VHI. All rights reserved.   Gaze Stabilization - Standing Feet Apart   Feet shoulder width apart, keeping eyes on target on wall 3 feet away, tilt head down slightly and move head side to side for 90 (1:30) seconds. Repeat while moving head up and down for 90 seconds (1:30).  Try to keep your body from moving up/down or side to side.   Do 2-3 sessions per day.   Feet Together - Eyes Closed      With eyes closed and feet together.  Feel the weight in the middle of your feet.  Close your eyes and hold your balance in the middle for 30 seconds Repeat 3 times per session. Do 2 sessions per day.    Feet Partial Heel-Toe, Head Motion - Eyes Open    With eyes open, right foot partially in front of the other, move head slowly: side to side 10 times.  Switch feet left foot forwards - head side to side 10 times. Return to right foot forwards and perform 10 head nods: up and down.  Switch feet and repeat head up and down 10 times.  Do 2 sessions per day.   Single Leg - Eyes Open    Holding support if needed.  Slowly and with control lift your right leg in the air.  Hold it 2-3 seconds and then lower it back down slowly.  Repeat with the other leg. Continue to alternate and  repeat 10 times total. Do 2 sessions per day.

## 2018-11-28 NOTE — Therapy (Signed)
[facility] [address] [place], [place], [address] Phone: [phone]   Fax:  [phone]  Physical Therapy Treatment  Patient Details  Name: Doris [name] MRN: [id number] Date of Birth: November 16, 1945 Referring Provider (PT): Audley Hose, MD   Encounter Date: 11/27/2018  PT End of Session - 11/28/18 1049    Visit Number  5    Number of Visits  13    Date for PT Re-Evaluation  12/26/18    Authorization Type  Medicare and Medicaid - 10th visit PN    PT Start Time  1620    PT Stop Time  1703    PT Time Calculation (min)  43 min    Activity Tolerance  Patient tolerated treatment well    Behavior During Therapy  [facility] for tasks assessed/performed       Past Medical History:  Diagnosis Date  . Acid reflux   . Back pain   . HLD (hyperlipidemia)   . Hypertension   . Insomnia   . Migraine   . Osteoarthritis    knee  . Psychosis ([place])    followed by Dr. Keenan Bachelor [phone]  . Thyroid nodule 2010   TSH 04/2008 = 0.296, no follow up noted in [facility]  . Vertigo     Past Surgical History:  Procedure Laterality Date  . TONSILLECTOMY    . UTERINE FIBROID SURGERY      There were no vitals filed for this visit.  Subjective Assessment - 11/27/18 1625    Subjective  Had an okay weekend.  Had one episode of dizziness this weekend while performing the eye/letter exercise in standing.    Pertinent History  multi-canal BPPV, vertigo, thyroid nodule and hypothyroidism, OA, osteoporosis, depression with psychosis, tachycardia and palpitations, migraine, insomnia, HLD, back pain, HTN, coronary artery calcification, multiple falls with head injury, and latent TB    Limitations  Standing;Walking    Diagnostic tests  CT scan following most recent fall    Currently in Pain?  No/denies             Vestibular Assessment - 11/27/18 1634      Visual Acuity   Static  unable to assess due to pt not having distance  glasses                Vestibular Treatment/Exercise - 11/27/18 1634      Vestibular Treatment/Exercise   Vestibular Treatment Provided  Gaze    Gaze Exercises  X1 Viewing Horizontal;X1 Viewing Vertical      X1 Viewing Horizontal   Foot Position  standing feet apart without UE support    Reps  2    Comments  60 seconds > 90 seconds with slight increase in speed      X1 Viewing Vertical   Foot Position  standing feet apart without UE support    Reps  2    Comments  60 seconds > 90 seconds with slight increase in speed         Balance Exercises - 11/28/18 1101      Balance Exercises: Standing   Standing Eyes Opened  Narrow base of support (BOS);Head turns;Solid surface;Other reps (comment)   no changes to this exercise; still having intermittent LOB   Standing Eyes Closed  Narrow base of support (BOS);Solid surface;3 reps;30 secs   progressed to 30 second hold       PT Education - 11/28/18 1049    Education Details  progression of HEP  Person(s) Educated  Patient    Methods  Explanation;Demonstration;Handout    Comprehension  Verbalized understanding;Returned demonstration       PT Short Term Goals - 11/27/18 1628      PT SHORT TERM GOAL #1   Title  Pt will demonstrate independence with initial HEP    Time  4    Period  Weeks    Status  Achieved    Target Date  11/27/18      PT SHORT TERM GOAL #2   Title  Pt will participate in further assessment of balance and falls risk    Time  4    Period  Weeks    Status  Achieved    Target Date  11/27/18      PT SHORT TERM GOAL #3   Title  Pt will tolerate canalith repositioning manuevers for L and R side with resolution of symptoms    Time  4    Period  Weeks    Status  Achieved    Target Date  11/27/18      PT SHORT TERM GOAL #4   Title  Pt will participate in further assessment of vestibular impairments with MSQ or DVA    Baseline  Pt did not have her distance glasses today    Time  4    Period   Weeks    Status  Unable to assess    Target Date  11/27/18        PT Long Term Goals - 11/25/18 1155      PT LONG TERM GOAL #1   Title  Pt will demonstrate independence with vestibular and balance HEP  (LTG due 12/27/2018)    Time  8    Period  Weeks    Status  New      PT LONG TERM GOAL #2   Title  Pt will report no symptoms of vertigo or motion sensitivity during bed mobility, ambulation and ADLs.    Baseline  MSQ TBA    Time  8    Period  Weeks    Status  New      PT LONG TERM GOAL #3   Title  Pt will demonstrate decreased falls risk as indicated by improvement in FGA by 4 points    Baseline  19/30    Time  8    Period  Weeks    Status  Revised      PT LONG TERM GOAL #4   Title  Pt will demonstrate improved use of VOR as indicated by improvement in DVA to 2-3 line difference    Baseline  TBA    Time  8    Period  Weeks    Status  New      PT LONG TERM GOAL #5   Title  Pt will demonstrate negative positional testing for R and L side    Time  8    Period  Weeks    Status  New            Plan - 11/28/18 1051    Clinical Impression Statement  Performed assessment of progress towards STG.  Pt is making steady progress and has met 3/4 STG; unable to assess final goal (DVA) due to patient not having distance glasses today.  Pt is reporting decreased headaches and decreased episodes of vertigo with bed mobility and is making progress with VOR exercises.  Pt is also demonstrating improvements in use of balance strategies to  assist with static and dynamic balance but will benefit from skiled PT services to continue to address cervical sensorimotor impairments, peripheral vestibular impairments and impairments in standing balance and gait to continue to maximize functional mobility independence and decrease falls risk.    Personal Factors and Comorbidities  Comorbidity 3+;Past/Current Experience;Time since onset of injury/illness/exacerbation;Transportation;Social  Background    Comorbidities  multi-canal BPPV, vertigo, thyroid nodule and hypothyroidism, OA, osteoporosis, depression with psychosis,tachycardia and palpitations, migraine, insomnia, HLD, back pain, HTN, coronary artery calcification, multiple falls with head injury, and latent TB    Examination-Activity Limitations  Bed Mobility;Locomotion Level;Stairs;Transfers;Stand;Bend    Examination-Participation Restrictions  Driving;Community Activity;Cleaning    Stability/Clinical Decision Making  Evolving/Moderate complexity    Rehab Potential  Good    PT Frequency  2x / week    PT Duration  8 weeks    PT Treatment/Interventions  ADLs/Self Care Home Management;Canalith Repostioning;Gait training;Stair training;Functional mobility training;Therapeutic activities;Therapeutic exercise;Balance training;Neuromuscular re-education;Patient/family education;Vestibular;Passive range of motion;Manual techniques;Moist Heat;Cryotherapy;Dry needling    PT Next Visit Plan  Progress x1 viewing.  Dynamic gait with head movements; rockerboard; neck strengthening exercises.  Continue to treat BPPV if needed.  Progress HEP  If still having headaches assess neck ROM/proprioception.    PT Home Exercise Plan  cervical strengthening/proprioception, vestibular, balance    Consulted and Agree with Plan of Care  Patient       Patient will benefit from skilled therapeutic intervention in order to improve the following deficits and impairments:  Decreased balance, Difficulty walking, Dizziness, Pain, Decreased activity tolerance, Decreased range of motion  Visit Diagnosis: Dizziness and giddiness  Unsteadiness on feet  Other abnormalities of gait and mobility     Problem List Patient Active Problem List   Diagnosis Date Noted  . Impaired cognition 05/03/2018  . Vitamin D deficiency 12/08/2016  . Overweight 10/27/2016  . Postablative hypothyroidism 10/27/2016  . Prediabetes 10/27/2016  . Muscle spasms of neck  08/26/2016  . Abnormality of gait 08/10/2016  . Osteoporosis 10/13/2015  . Acrochordon 07/07/2015  . Jaw pain 12/30/2014  . Coronary artery calcification 12/23/2014  . Tachycardia 08/27/2014  . Cholelithiasis 05/17/2014  . GERD (gastroesophageal reflux disease) 01/02/2013  . TB lung, latent 05/01/2012  . Osteoarthritis 03/01/2012  . Severe major depression with psychotic features ([place]) 04/01/2011  . Hypothyroidism (acquired) 03/22/2011  . Healthcare maintenance 12/28/2010  . BPPV (benign paroxysmal positional vertigo) 11/19/2008  . Hyperlipidemia 11/30/2005  . Essential hypertension 11/30/2005  . LOW BACK PAIN 11/30/2005    Rico Junker, PT, DPT 11/28/18    11:03 AM    [facility] [address] [place], [place], [address] Phone: [phone]   Fax:  [phone]  Name: Marquisa Salih MRN: [id number] Date of Birth: 02/25/1945

## 2018-12-01 ENCOUNTER — Other Ambulatory Visit: Payer: Self-pay

## 2018-12-01 ENCOUNTER — Encounter: Payer: Self-pay | Admitting: Physical Therapy

## 2018-12-01 ENCOUNTER — Ambulatory Visit: Payer: Medicare Other | Admitting: Physical Therapy

## 2018-12-01 DIAGNOSIS — R42 Dizziness and giddiness: Secondary | ICD-10-CM

## 2018-12-01 DIAGNOSIS — R2681 Unsteadiness on feet: Secondary | ICD-10-CM | POA: Diagnosis not present

## 2018-12-01 DIAGNOSIS — R2689 Other abnormalities of gait and mobility: Secondary | ICD-10-CM | POA: Diagnosis not present

## 2018-12-01 DIAGNOSIS — H8113 Benign paroxysmal vertigo, bilateral: Secondary | ICD-10-CM | POA: Diagnosis not present

## 2018-12-01 NOTE — Patient Instructions (Signed)
Gaze Stabilization - Tip Card  1.Target must remain in focus, not blurry, and appear stationary while head is in motion. 2.Perform exercises with small head movements (45 to either side of midline). 3.Increase speed of head motion so long as target is in focus. 4.If you wear eyeglasses, be sure you can see target through lens (therapist will give specific instructions for bifocal / progressive lenses). 5.These exercises may provoke dizziness or nausea. Work through these symptoms. If too dizzy, slow head movement slightly. Rest between each exercise. 6.Exercises demand concentration; avoid distractions. 7.For safety, perform standing exercises close to a counter, wall, corner, or next to someone.  Copyright  VHI. All rights reserved.   Gaze Stabilization - Standing Feet Apart   STAND WITH FEET TOGETHER, keeping eyes on target on wall 3 feet away, tilt head down slightly and move head side to side for 1 minutes. Repeat while moving head up and down for 1 minutes.  GRADUALLY WORK UP TO 2 MINUTES. Try to keep your body from moving up/down or side to side.   Do 2-3 sessions per day.   Feet Together - Eyes Closed      With eyes closed and feet together.  Feel the weight in the middle of your feet.  Close your eyes and hold your balance in the middle for 30 seconds Repeat 3 times per session. Do 2 sessions per day.    Feet Partial Heel-Toe, Head Motion - Eyes Open    With eyes open, right foot partially in front of the other, move head slowly: side to side 10 times.  Switch feet left foot forwards - head side to side 10 times. Return to right foot forwards and perform 10 head nods: up and down.  Switch feet and repeat head up and down 10 times.  Do 2 sessions per day.   Single Leg - Eyes Open    Holding support if needed.  Slowly and with control lift your right leg in the air.  Hold it 2-3 seconds and then lower it back down slowly.  Repeat with the other leg. Continue to  alternate and repeat 10 times total. Do 2 sessions per day.

## 2018-12-01 NOTE — Therapy (Signed)
[facility] [address] [place], [place], [address] Phone: [phone]   Fax:  [phone]  Physical Therapy Treatment  Patient Details  Name: Doris [name] MRN: [id number] Date of Birth: 12-Dec-1945 Referring Provider (PT): Audley Hose, MD   Encounter Date: 12/01/2018  PT End of Session - 12/01/18 0856    Visit Number  6    Number of Visits  13    Date for PT Re-Evaluation  12/26/18    Authorization Type  Medicare and Medicaid - 10th visit PN    PT Start Time  0805    PT Stop Time  0845    PT Time Calculation (min)  40 min    Activity Tolerance  Patient tolerated treatment well    Behavior During Therapy  [facility] for tasks assessed/performed       Past Medical History:  Diagnosis Date  . Acid reflux   . Back pain   . HLD (hyperlipidemia)   . Hypertension   . Insomnia   . Migraine   . Osteoarthritis    knee  . Psychosis ([place])    followed by Dr. Keenan Bachelor [phone]  . Thyroid nodule 2010   TSH 04/2008 = 0.296, no follow up noted in [facility]  . Vertigo     Past Surgical History:  Procedure Laterality Date  . TONSILLECTOMY    . UTERINE FIBROID SURGERY      There were no vitals filed for this visit.  Subjective Assessment - 12/01/18 0806    Subjective  Had a sudden muscle grab when she stood from chair; ambulating with trunk flexion and antalgic gait.  Exercises are going well, no episodes of dizziness.    Pertinent History  multi-canal BPPV, vertigo, thyroid nodule and hypothyroidism, OA, osteoporosis, depression with psychosis, tachycardia and palpitations, migraine, insomnia, HLD, back pain, HTN, coronary artery calcification, multiple falls with head injury, and latent TB    Limitations  Standing;Walking    Diagnostic tests  CT scan following most recent fall    Currently in Pain?  Yes                       [facility] Adult PT Treatment/Exercise - 12/01/18 0809      Exercises   Exercises  Other Exercises    Other Exercises   Simple seated R side stretches and trunk flexion for quadratus lumborum stretch      Vestibular Treatment/Exercise - 12/01/18 0835      Vestibular Treatment/Exercise   Vestibular Treatment Provided  Gaze    Gaze Exercises  X1 Viewing Horizontal;X1 Viewing Vertical      X1 Viewing Horizontal   Foot Position  standing feet apart and feet together; gradually increasing speed    Reps  4    Comments  2 minutes with feet apart, 1 minute at faster speed and with feet together      X1 Viewing Vertical   Foot Position  standing feet apart and feet together; gradually increasing speed    Reps  4    Comments  2 minutes with feet apart, 1 minute at faster speed and with feet together         Balance Exercises - 12/01/18 0836      Balance Exercises: Standing   Tandem Stance  Foam/compliant surface;Eyes open;Intermittent upper extremity support;Other reps (comment)   10 reps head nods/turns with each foot forwards   Tandem Gait  Forward;Upper extremity support;Foam/compliant surface;4 reps  Sidestepping  Foam/compliant support;2 reps   to L and R with intermittent UE support       PT Education - 12/01/18 0855    Education Details  progressed x1 viewing    Person(s) Educated  Patient    Methods  Explanation;Demonstration;Handout    Comprehension  Verbalized understanding;Returned demonstration       PT Short Term Goals - 11/27/18 1628      PT SHORT TERM GOAL #1   Title  Pt will demonstrate independence with initial HEP    Time  4    Period  Weeks    Status  Achieved    Target Date  11/27/18      PT SHORT TERM GOAL #2   Title  Pt will participate in further assessment of balance and falls risk    Time  4    Period  Weeks    Status  Achieved    Target Date  11/27/18      PT SHORT TERM GOAL #3   Title  Pt will tolerate canalith repositioning manuevers for L and R side with resolution of symptoms    Time  4    Period  Weeks     Status  Achieved    Target Date  11/27/18      PT SHORT TERM GOAL #4   Title  Pt will participate in further assessment of vestibular impairments with MSQ or DVA    Baseline  Pt did not have her distance glasses today    Time  4    Period  Weeks    Status  Unable to assess    Target Date  11/27/18        PT Long Term Goals - 11/25/18 1155      PT LONG TERM GOAL #1   Title  Pt will demonstrate independence with vestibular and balance HEP  (LTG due 12/27/2018)    Time  8    Period  Weeks    Status  New      PT LONG TERM GOAL #2   Title  Pt will report no symptoms of vertigo or motion sensitivity during bed mobility, ambulation and ADLs.    Baseline  MSQ TBA    Time  8    Period  Weeks    Status  New      PT LONG TERM GOAL #3   Title  Pt will demonstrate decreased falls risk as indicated by improvement in FGA by 4 points    Baseline  19/30    Time  8    Period  Weeks    Status  Revised      PT LONG TERM GOAL #4   Title  Pt will demonstrate improved use of VOR as indicated by improvement in DVA to 2-3 line difference    Baseline  TBA    Time  8    Period  Weeks    Status  New      PT LONG TERM GOAL #5   Title  Pt will demonstrate negative positional testing for R and L side    Time  8    Period  Weeks    Status  New            Plan - 12/01/18 0856    Clinical Impression Statement  Pt continues to make significant progress; no episodes of dizziness and pt demonstrates progress with VOR at each visit.  Continues to have increased difficulty with  dynamic balance reactions, especially on compliant surfaces.  Will continue to address in order to progress towards LTG.    Personal Factors and Comorbidities  Comorbidity 3+;Past/Current Experience;Time since onset of injury/illness/exacerbation;Transportation;Social Background    Comorbidities  multi-canal BPPV, vertigo, thyroid nodule and hypothyroidism, OA, osteoporosis, depression with psychosis,tachycardia and  palpitations, migraine, insomnia, HLD, back pain, HTN, coronary artery calcification, multiple falls with head injury, and latent TB    Examination-Activity Limitations  Bed Mobility;Locomotion Level;Stairs;Transfers;Stand;Bend    Examination-Participation Restrictions  Driving;Community Activity;Cleaning    Stability/Clinical Decision Making  Evolving/Moderate complexity    Rehab Potential  Good    PT Frequency  2x / week    PT Duration  8 weeks    PT Treatment/Interventions  ADLs/Self Care Home Management;Canalith Repostioning;Gait training;Stair training;Functional mobility training;Therapeutic activities;Therapeutic exercise;Balance training;Neuromuscular re-education;Patient/family education;Vestibular;Passive range of motion;Manual techniques;Moist Heat;Cryotherapy;Dry needling    PT Next Visit Plan  Progress x1 viewing to busy background; blue balance beam for balance reactions; Dynamic gait with head movements; rockerboard; neck strengthening exercises.  Continue to treat BPPV if needed.  Progress HEP  If still having headaches assess neck ROM/proprioception.    PT Home Exercise Plan  cervical strengthening/proprioception, vestibular, balance    Consulted and Agree with Plan of Care  Patient       Patient will benefit from skilled therapeutic intervention in order to improve the following deficits and impairments:  Decreased balance, Difficulty walking, Dizziness, Pain, Decreased activity tolerance, Decreased range of motion  Visit Diagnosis: Unsteadiness on feet  Other abnormalities of gait and mobility  Dizziness and giddiness     Problem List Patient Active Problem List   Diagnosis Date Noted  . Impaired cognition 05/03/2018  . Vitamin D deficiency 12/08/2016  . Overweight 10/27/2016  . Postablative hypothyroidism 10/27/2016  . Prediabetes 10/27/2016  . Muscle spasms of neck 08/26/2016  . Abnormality of gait 08/10/2016  . Osteoporosis 10/13/2015  . Acrochordon  07/07/2015  . Jaw pain 12/30/2014  . Coronary artery calcification 12/23/2014  . Tachycardia 08/27/2014  . Cholelithiasis 05/17/2014  . GERD (gastroesophageal reflux disease) 01/02/2013  . TB lung, latent 05/01/2012  . Osteoarthritis 03/01/2012  . Severe major depression with psychotic features ([place]) 04/01/2011  . Hypothyroidism (acquired) 03/22/2011  . Healthcare maintenance 12/28/2010  . BPPV (benign paroxysmal positional vertigo) 11/19/2008  . Hyperlipidemia 11/30/2005  . Essential hypertension 11/30/2005  . LOW BACK PAIN 11/30/2005    Rico Junker, PT, DPT 12/01/18    8:59 AM    [facility] [address] [place], [place], [address] Phone: [phone]   Fax:  [phone]  Name: Doris [name] MRN: [id number] Date of Birth: 1945/05/12

## 2018-12-04 ENCOUNTER — Ambulatory Visit: Payer: Medicare Other | Admitting: Physical Therapy

## 2018-12-04 ENCOUNTER — Other Ambulatory Visit: Payer: Self-pay

## 2018-12-04 ENCOUNTER — Encounter: Payer: Self-pay | Admitting: Physical Therapy

## 2018-12-04 DIAGNOSIS — R2681 Unsteadiness on feet: Secondary | ICD-10-CM

## 2018-12-04 DIAGNOSIS — R2689 Other abnormalities of gait and mobility: Secondary | ICD-10-CM | POA: Diagnosis not present

## 2018-12-04 DIAGNOSIS — R42 Dizziness and giddiness: Secondary | ICD-10-CM | POA: Diagnosis not present

## 2018-12-04 DIAGNOSIS — H8113 Benign paroxysmal vertigo, bilateral: Secondary | ICD-10-CM | POA: Diagnosis not present

## 2018-12-04 NOTE — Patient Instructions (Signed)
Habituation - Tip Card  1.The goal of habituation training is to assist in decreasing symptoms of vertigo, dizziness, or nausea provoked by specific head and body motions. 2.These exercises may initially increase symptoms; however, be persistent and work through symptoms. With repetition and time, the exercises will assist in reducing or eliminating symptoms. 3.Exercises should be stopped and discussed with the therapist if you experience any of the following: - Sudden change or fluctuation in hearing - New onset of ringing in the ears, or increase in current intensity - Any fluid discharge from the ear - Severe pain in neck or back - Extreme nausea  Habituation - Sit to Side-Lying   Sit on edge of bed. Lie down onto the right side and hold until dizziness stops, plus 20 seconds.  Return to sitting and wait until dizziness stops, plus 20 seconds.  Repeat to the left side. Repeat sequence 5 times per session. Do 2 sessions per day.   Feet Together - Eyes Closed      With eyes closed and feet together.  Feel the weight in the middle of your feet.  Close your eyes and hold your balance in the middle for 30 seconds Repeat 3 times per session. Do 2 sessions per day.    Feet Partial Heel-Toe, Head Motion - Eyes Open    With eyes open, right foot partially in front of the other, move head slowly: side to side 10 times.  Switch feet left foot forwards - head side to side 10 times. Return to right foot forwards and perform 10 head nods: up and down.  Switch feet and repeat head up and down 10 times.  Do 2 sessions per day.   Single Leg - Eyes Open    Holding support if needed.  Slowly and with control lift your right leg in the air.  Hold it 2-3 seconds and then lower it back down slowly.  Repeat with the other leg. Continue to alternate and repeat 10 times total. Do 2 sessions per day.

## 2018-12-04 NOTE — Therapy (Signed)
[facility] [address] [place], [place], [address] Phone: [phone]   Fax:  [phone]  Physical Therapy Treatment  Patient Details  Name: Doris [name] MRN: [id number] Date of Birth: 1945/05/05 Referring Provider (PT): Audley Hose, MD   Encounter Date: 12/04/2018  PT End of Session - 12/04/18 2055    Visit Number  7    Number of Visits  13    Date for PT Re-Evaluation  12/26/18    Authorization Type  Medicare and Medicaid - 10th visit PN    PT Start Time  1700    PT Stop Time  1742    PT Time Calculation (min)  42 min    Activity Tolerance  Patient tolerated treatment well    Behavior During Therapy  [facility] for tasks assessed/performed       Past Medical History:  Diagnosis Date  . Acid reflux   . Back pain   . HLD (hyperlipidemia)   . Hypertension   . Insomnia   . Migraine   . Osteoarthritis    knee  . Psychosis ([place])    followed by Dr. Keenan Bachelor [phone]  . Thyroid nodule 2010   TSH 04/2008 = 0.296, no follow up noted in [facility]  . Vertigo     Past Surgical History:  Procedure Laterality Date  . TONSILLECTOMY    . UTERINE FIBROID SURGERY      There were no vitals filed for this visit.  Subjective Assessment - 12/04/18 1659    Subjective  Hip/back is feeling better today but R side of neck has increased tension today.  Tried to do the exercises the best she could over the weekend.  No dizziness when doing the exercises.    Pertinent History  multi-canal BPPV, vertigo, thyroid nodule and hypothyroidism, OA, osteoporosis, depression with psychosis, tachycardia and palpitations, migraine, insomnia, HLD, back pain, HTN, coronary artery calcification, multiple falls with head injury, and latent TB    Limitations  Standing;Walking    Diagnostic tests  CT scan following most recent fall    Currently in Pain?  No/denies                        Vestibular Treatment/Exercise  - 12/04/18 1701      Vestibular Treatment/Exercise   Vestibular Treatment Provided  Gaze;Habituation    Habituation Exercises  Nestor Lewandowsky    Gaze Exercises  X1 Viewing Horizontal;X1 Viewing Vertical       EPLEY MANUEVER RIGHT   Response Details   .      Nestor Lewandowsky   Number of Reps   3    Symptom Description   symptoms improved with repetition; used 2 > 1 pillow       X1 Viewing Horizontal   Foot Position  feet apart, busy background    Reps  1    Comments  60 seconds; no symptoms      X1 Viewing Vertical   Foot Position  feet apart, busy background    Reps  1    Comments  60 seconds; no symptoms         Balance Exercises - 12/04/18 1729      Balance Exercises: Standing   Standing Eyes Opened  Narrow base of support (BOS);Head turns;Foam/compliant surface;Other reps (comment)   10 reps head turns/nods; 2 sets   Balance Beam  Standing across blue balance beam with feet apart performed head and body rotations to  L and R reaching for yoga block behind pt at various heights encouraging R and L head turns and diagonal head movements x 10 reps to each side    Tandem Gait  Forward;Upper extremity support;Foam/compliant surface;4 reps   blue balance beam, head turns to L and R every 3 steps       PT Education - 12/04/18 1747    Education Details  updated HEP to include habituation, removed x1 viewing    Person(s) Educated  Patient    Methods  Explanation;Demonstration;Handout    Comprehension  Verbalized understanding;Returned demonstration       PT Short Term Goals - 11/27/18 1628      PT SHORT TERM GOAL #1   Title  Pt will demonstrate independence with initial HEP    Time  4    Period  Weeks    Status  Achieved    Target Date  11/27/18      PT SHORT TERM GOAL #2   Title  Pt will participate in further assessment of balance and falls risk    Time  4    Period  Weeks    Status  Achieved    Target Date  11/27/18      PT SHORT TERM GOAL #3   Title  Pt will  tolerate canalith repositioning manuevers for L and R side with resolution of symptoms    Time  4    Period  Weeks    Status  Achieved    Target Date  11/27/18      PT SHORT TERM GOAL #4   Title  Pt will participate in further assessment of vestibular impairments with MSQ or DVA    Baseline  Pt did not have her distance glasses today    Time  4    Period  Weeks    Status  Unable to assess    Target Date  11/27/18        PT Long Term Goals - 11/25/18 1155      PT LONG TERM GOAL #1   Title  Pt will demonstrate independence with vestibular and balance HEP  (LTG due 12/27/2018)    Time  8    Period  Weeks    Status  New      PT LONG TERM GOAL #2   Title  Pt will report no symptoms of vertigo or motion sensitivity during bed mobility, ambulation and ADLs.    Baseline  MSQ TBA    Time  8    Period  Weeks    Status  New      PT LONG TERM GOAL #3   Title  Pt will demonstrate decreased falls risk as indicated by improvement in FGA by 4 points    Baseline  19/30    Time  8    Period  Weeks    Status  Revised      PT LONG TERM GOAL #4   Title  Pt will demonstrate improved use of VOR as indicated by improvement in DVA to 2-3 line difference    Baseline  TBA    Time  8    Period  Weeks    Status  New      PT LONG TERM GOAL #5   Title  Pt will demonstrate negative positional testing for R and L side    Time  8    Period  Weeks    Status  New  Plan - 12/04/18 2056    Clinical Impression Statement  Pt continues to have tolerate higher level VOR training without any symptoms of dizziness or imbalance.  Pt continues to report symptoms with bed mobility - removed x1 viewing from HEP and replaced it with habituation with Brandt-Daroff.  Continued to perform dynamic standing balance and balance reaction training on compliant surface with incorporation of head and body turns; pt required intermittent min-mod A to maintain balance due to posterior LOB.  Will continue to  address in order to progress towards LTG.    Personal Factors and Comorbidities  Comorbidity 3+;Past/Current Experience;Time since onset of injury/illness/exacerbation;Transportation;Social Background    Comorbidities  multi-canal BPPV, vertigo, thyroid nodule and hypothyroidism, OA, osteoporosis, depression with psychosis,tachycardia and palpitations, migraine, insomnia, HLD, back pain, HTN, coronary artery calcification, multiple falls with head injury, and latent TB    Examination-Activity Limitations  Bed Mobility;Locomotion Level;Stairs;Transfers;Stand;Bend    Examination-Participation Restrictions  Driving;Community Activity;Cleaning    Stability/Clinical Decision Making  Evolving/Moderate complexity    Rehab Potential  Good    PT Frequency  2x / week    PT Duration  8 weeks    PT Treatment/Interventions  ADLs/Self Care Home Management;Canalith Repostioning;Gait training;Stair training;Functional mobility training;Therapeutic activities;Therapeutic exercise;Balance training;Neuromuscular re-education;Patient/family education;Vestibular;Passive range of motion;Manual techniques;Moist Heat;Cryotherapy;Dry needling    PT Next Visit Plan  3 more visits; begin to prepare for D/C next week.  blue balance beam for balance reactions; Dynamic gait with head movements; rockerboard; neck strengthening exercises.  Continue to treat BPPV if needed on R side.    PT Home Exercise Plan  cervical strengthening/proprioception, vestibular, balance    Consulted and Agree with Plan of Care  Patient       Patient will benefit from skilled therapeutic intervention in order to improve the following deficits and impairments:  Decreased balance, Difficulty walking, Dizziness, Pain, Decreased activity tolerance, Decreased range of motion  Visit Diagnosis: Unsteadiness on feet  Other abnormalities of gait and mobility  Dizziness and giddiness     Problem List Patient Active Problem List   Diagnosis Date  Noted  . Impaired cognition 05/03/2018  . Vitamin D deficiency 12/08/2016  . Overweight 10/27/2016  . Postablative hypothyroidism 10/27/2016  . Prediabetes 10/27/2016  . Muscle spasms of neck 08/26/2016  . Abnormality of gait 08/10/2016  . Osteoporosis 10/13/2015  . Acrochordon 07/07/2015  . Jaw pain 12/30/2014  . Coronary artery calcification 12/23/2014  . Tachycardia 08/27/2014  . Cholelithiasis 05/17/2014  . GERD (gastroesophageal reflux disease) 01/02/2013  . TB lung, latent 05/01/2012  . Osteoarthritis 03/01/2012  . Severe major depression with psychotic features ([place]) 04/01/2011  . Hypothyroidism (acquired) 03/22/2011  . Healthcare maintenance 12/28/2010  . BPPV (benign paroxysmal positional vertigo) 11/19/2008  . Hyperlipidemia 11/30/2005  . Essential hypertension 11/30/2005  . LOW BACK PAIN 11/30/2005    Rico Junker, PT, DPT 12/04/18    9:06 PM    [facility] [address] [place], [place], [address] Phone: [phone]   Fax:  [phone]  Name: Doris [name] MRN: [id number] Date of Birth: [birth year]

## 2018-12-06 ENCOUNTER — Other Ambulatory Visit: Payer: Self-pay

## 2018-12-06 ENCOUNTER — Encounter: Payer: Self-pay | Admitting: Physical Therapy

## 2018-12-06 ENCOUNTER — Ambulatory Visit: Payer: Medicare Other | Admitting: Physical Therapy

## 2018-12-06 DIAGNOSIS — R42 Dizziness and giddiness: Secondary | ICD-10-CM | POA: Diagnosis not present

## 2018-12-06 DIAGNOSIS — R2681 Unsteadiness on feet: Secondary | ICD-10-CM | POA: Diagnosis not present

## 2018-12-06 DIAGNOSIS — H8113 Benign paroxysmal vertigo, bilateral: Secondary | ICD-10-CM

## 2018-12-06 DIAGNOSIS — R2689 Other abnormalities of gait and mobility: Secondary | ICD-10-CM | POA: Diagnosis not present

## 2018-12-06 NOTE — Patient Instructions (Addendum)
Habituation - Tip Card  1.The goal of habituation training is to assist in decreasing symptoms of vertigo, dizziness, or nausea provoked by specific head and body motions. 2.These exercises may initially increase symptoms; however, be persistent and work through symptoms. With repetition and time, the exercises will assist in reducing or eliminating symptoms. 3.Exercises should be stopped and discussed with the therapist if you experience any of the following: - Sudden change or fluctuation in hearing - New onset of ringing in the ears, or increase in current intensity - Any fluid discharge from the ear - Severe pain in neck or back - Extreme nausea  Habituation - Sit to Side-Lying   Sit on edge of bed. Lie down onto the right side and hold until dizziness stops, plus 20 seconds.  Return to sitting and wait until dizziness stops, plus 20 seconds.  Repeat to the left side. Repeat sequence 5 times per session. Do 2 sessions per day.   Feet Together - Eyes Closed      With eyes closed and feet together.  Feel the weight in the middle of your feet.  Close your eyes and hold your balance in the middle for 30 seconds Repeat 3 times per session. Do 2 sessions per day.    Feet Partial Heel-Toe, Head Motion - Eyes Open    With eyes open, right foot partially in front of the other, move head slowly: side to side 10 times.  Switch feet left foot forwards - head side to side 10 times. Return to right foot forwards and perform 10 head nods: up and down.  Switch feet and repeat head up and down 10 times.  Do 2 sessions per day.   Single Leg - Eyes Open    Holding support if needed.  Slowly and with control lift your right leg in the air.  Hold it 2-3 seconds and then lower it back down slowly.  Repeat with the other leg. Continue to alternate and repeat 10 times total. Do 2 sessions per day.    Backward    In your hallway - walk quickly forwards to the end, stop and quickly change  to walking backwards back to the start. Repeat 4 times and focus on walking and changing direction quickly  Copyright  VHI. All rights reserved.  Random Direction Head Motion    Walking on solid surface, normal speed.  Turn head and eyes left for 3 steps, right for 3 steps. Repeat looking up for 3 steps and then down for 3 steps. Perform 4 laps in hallway.

## 2018-12-06 NOTE — Therapy (Signed)
[facility] [address] [place], [place], [address] Phone: [phone]   Fax:  [phone]  Physical Therapy Treatment  Patient Details  Name: Doris [name] MRN: [id number] Date of Birth: Aug 14, 1945 Referring Provider (PT): Audley Hose, MD   Encounter Date: 12/06/2018  PT End of Session - 12/06/18 0946    Visit Number  8    Number of Visits  13    Date for PT Re-Evaluation  12/26/18    Authorization Type  Medicare and Medicaid - 10th visit PN    PT Start Time  0813   arrived late   PT Stop Time  0845    PT Time Calculation (min)  32 min    Activity Tolerance  Patient tolerated treatment well    Behavior During Therapy  [facility] for tasks assessed/performed       Past Medical History:  Diagnosis Date  . Acid reflux   . Back pain   . HLD (hyperlipidemia)   . Hypertension   . Insomnia   . Migraine   . Osteoarthritis    knee  . Psychosis ([place])    followed by Dr. Keenan Bachelor [phone]  . Thyroid nodule 2010   TSH 04/2008 = 0.296, no follow up noted in [facility]  . Vertigo     Past Surgical History:  Procedure Laterality Date  . TONSILLECTOMY    . UTERINE FIBROID SURGERY      There were no vitals filed for this visit.  Subjective Assessment - 12/06/18 0815    Subjective  Running late today; wearing different shoes and feet are sore with less support. No symptoms of dizziness.    Pertinent History  multi-canal BPPV, vertigo, thyroid nodule and hypothyroidism, OA, osteoporosis, depression with psychosis, tachycardia and palpitations, migraine, insomnia, HLD, back pain, HTN, coronary artery calcification, multiple falls with head injury, and latent TB    Limitations  Standing;Walking    Diagnostic tests  CT scan following most recent fall    Currently in Pain?  No/denies             Vestibular Assessment - 12/06/18 0825      Positional Testing   Dix-Hallpike  Dix-Hallpike Right    Sidelying  Test  Sidelying Right      Dix-Hallpike Right   Dix-Hallpike Right Duration  5 seconds    Dix-Hallpike Right Symptoms  Upbeat, right rotatory nystagmus      Sidelying Right   Sidelying Right Duration  10 seconds    Sidelying Right Symptoms  Upbeat, right rotatory nystagmus                Vestibular Treatment/Exercise - 12/06/18 0826      Vestibular Treatment/Exercise   Vestibular Treatment Provided  Canalith Repositioning    Canalith Repositioning  Epley Manuever Right       EPLEY MANUEVER RIGHT   Number of Reps   2    Overall Response  Improved Symptoms         Balance Exercises - 12/06/18 0827      Balance Exercises: Standing   Gait with Head Turns  Forward;4 reps   2 sets with vertical and horizontal head turns   Retro Gait  4 reps   2 sets forwards and retro at faster speeds       PT Education - 12/06/18 0945    Education Details  updated HEP; advised to hold on habituation until Saturday.  Plan to finalize and D/C next week  Person(s) Educated  Patient    Methods  Explanation;Demonstration;Handout    Comprehension  Verbalized understanding;Returned demonstration      Habituation - Tip Card  1.The goal of habituation training is to assist in decreasing symptoms of vertigo, dizziness, or nausea provoked by specific head and body motions. 2.These exercises may initially increase symptoms; however, be persistent and work through symptoms. With repetition and time, the exercises will assist in reducing or eliminating symptoms. 3.Exercises should be stopped and discussed with the therapist if you experience any of the following: - Sudden change or fluctuation in hearing - New onset of ringing in the ears, or increase in current intensity - Any fluid discharge from the ear - Severe pain in neck or back - Extreme nausea  Habituation - Sit to Side-Lying   Sit on edge of bed. Lie down onto the right side and hold until dizziness stops, plus 20 seconds.   Return to sitting and wait until dizziness stops, plus 20 seconds.  Repeat to the left side. Repeat sequence 5 times per session. Do 2 sessions per day.   Feet Together - Eyes Closed      With eyes closed and feet together.  Feel the weight in the middle of your feet.  Close your eyes and hold your balance in the middle for 30 seconds Repeat 3 times per session. Do 2 sessions per day.    Feet Partial Heel-Toe, Head Motion - Eyes Open    With eyes open, right foot partially in front of the other, move head slowly: side to side 10 times.  Switch feet left foot forwards - head side to side 10 times. Return to right foot forwards and perform 10 head nods: up and down.  Switch feet and repeat head up and down 10 times.  Do 2 sessions per day.   Single Leg - Eyes Open    Holding support if needed.  Slowly and with control lift your right leg in the air.  Hold it 2-3 seconds and then lower it back down slowly.  Repeat with the other leg. Continue to alternate and repeat 10 times total. Do 2 sessions per day.    Backward    In your hallway - walk quickly forwards to the end, stop and quickly change to walking backwards back to the start. Repeat 4 times and focus on walking and changing direction quickly  Copyright  VHI. All rights reserved.  Random Direction Head Motion    Walking on solid surface, normal speed.  Turn head and eyes left for 3 steps, right for 3 steps. Repeat looking up for 3 steps and then down for 3 steps. Perform 4 laps in hallway.   PT Short Term Goals - 11/27/18 1628      PT SHORT TERM GOAL #1   Title  Pt will demonstrate independence with initial HEP    Time  4    Period  Weeks    Status  Achieved    Target Date  11/27/18      PT SHORT TERM GOAL #2   Title  Pt will participate in further assessment of balance and falls risk    Time  4    Period  Weeks    Status  Achieved    Target Date  11/27/18      PT SHORT TERM GOAL #3   Title  Pt  will tolerate canalith repositioning manuevers for L and R side with resolution of symptoms  Time  4    Period  Weeks    Status  Achieved    Target Date  11/27/18      PT SHORT TERM GOAL #4   Title  Pt will participate in further assessment of vestibular impairments with MSQ or DVA    Baseline  Pt did not have her distance glasses today    Time  4    Period  Weeks    Status  Unable to assess    Target Date  11/27/18        PT Long Term Goals - 11/25/18 1155      PT LONG TERM GOAL #1   Title  Pt will demonstrate independence with vestibular and balance HEP  (LTG due 12/27/2018)    Time  8    Period  Weeks    Status  New      PT LONG TERM GOAL #2   Title  Pt will report no symptoms of vertigo or motion sensitivity during bed mobility, ambulation and ADLs.    Baseline  MSQ TBA    Time  8    Period  Weeks    Status  New      PT LONG TERM GOAL #3   Title  Pt will demonstrate decreased falls risk as indicated by improvement in FGA by 4 points    Baseline  19/30    Time  8    Period  Weeks    Status  Revised      PT LONG TERM GOAL #4   Title  Pt will demonstrate improved use of VOR as indicated by improvement in DVA to 2-3 line difference    Baseline  TBA    Time  8    Period  Weeks    Status  New      PT LONG TERM GOAL #5   Title  Pt will demonstrate negative positional testing for R and L side    Time  8    Period  Weeks    Status  New            Plan - 12/06/18 0946    Clinical Impression Statement  Pt continues to present with mild vertigo and upbeating, R rotary nystagmus when performing Brandt-Daroff on R side.  Performed 2 more reps CRM with continued improvement in symptoms but not resolution.  Continued to address dynamic balance impairments with gait by adding fast forward/retro gait and gait with head turns and 180 turns to HEP.  Pt tolerated well but continues to require significant cues to perform correct technique or sequence.  Will begin to assess  LTG, finalize HEP and safety recommendations next week in preparation for D/C    Personal Factors and Comorbidities  Comorbidity 3+;Past/Current Experience;Time since onset of injury/illness/exacerbation;Transportation;Social Background    Comorbidities  multi-canal BPPV, vertigo, thyroid nodule and hypothyroidism, OA, osteoporosis, depression with psychosis,tachycardia and palpitations, migraine, insomnia, HLD, back pain, HTN, coronary artery calcification, multiple falls with head injury, and latent TB    Examination-Activity Limitations  Bed Mobility;Locomotion Level;Stairs;Transfers;Stand;Bend    Examination-Participation Restrictions  Driving;Community Activity;Cleaning    Stability/Clinical Decision Making  Evolving/Moderate complexity    Rehab Potential  Good    PT Frequency  2x / week    PT Duration  8 weeks    PT Treatment/Interventions  ADLs/Self Care Home Management;Canalith Repostioning;Gait training;Stair training;Functional mobility training;Therapeutic activities;Therapeutic exercise;Balance training;Neuromuscular re-education;Patient/family education;Vestibular;Passive range of motion;Manual techniques;Moist Heat;Cryotherapy;Dry needling    PT Next Visit  Plan  treat R BPPV as needed; check LTG, finalize HEP, D/C    PT Home Exercise Plan  cervical strengthening/proprioception, vestibular, balance    Consulted and Agree with Plan of Care  Patient       Patient will benefit from skilled therapeutic intervention in order to improve the following deficits and impairments:  Decreased balance, Difficulty walking, Dizziness, Pain, Decreased activity tolerance, Decreased range of motion  Visit Diagnosis: Unsteadiness on feet  Other abnormalities of gait and mobility  Dizziness and giddiness  BPPV (benign paroxysmal positional vertigo), bilateral     Problem List Patient Active Problem List   Diagnosis Date Noted  . Impaired cognition 05/03/2018  . Vitamin D deficiency  12/08/2016  . Overweight 10/27/2016  . Postablative hypothyroidism 10/27/2016  . Prediabetes 10/27/2016  . Muscle spasms of neck 08/26/2016  . Abnormality of gait 08/10/2016  . Osteoporosis 10/13/2015  . Acrochordon 07/07/2015  . Jaw pain 12/30/2014  . Coronary artery calcification 12/23/2014  . Tachycardia 08/27/2014  . Cholelithiasis 05/17/2014  . GERD (gastroesophageal reflux disease) 01/02/2013  . TB lung, latent 05/01/2012  . Osteoarthritis 03/01/2012  . Severe major depression with psychotic features ([place]) 04/01/2011  . Hypothyroidism (acquired) 03/22/2011  . Healthcare maintenance 12/28/2010  . BPPV (benign paroxysmal positional vertigo) 11/19/2008  . Hyperlipidemia 11/30/2005  . Essential hypertension 11/30/2005  . LOW BACK PAIN 11/30/2005    Rico Junker, PT, DPT 12/06/18    9:51 AM    [facility] [address] [place], [place], [address] Phone: [phone]   Fax:  [phone]  Name: Doris [name] MRN: [id number] Date of Birth: 28-Dec-1945

## 2018-12-11 ENCOUNTER — Ambulatory Visit: Payer: Medicare Other | Admitting: Physical Therapy

## 2018-12-11 ENCOUNTER — Encounter: Payer: Self-pay | Admitting: Physical Therapy

## 2018-12-11 ENCOUNTER — Other Ambulatory Visit: Payer: Self-pay

## 2018-12-11 DIAGNOSIS — R42 Dizziness and giddiness: Secondary | ICD-10-CM | POA: Diagnosis not present

## 2018-12-11 DIAGNOSIS — R21 Rash and other nonspecific skin eruption: Secondary | ICD-10-CM | POA: Diagnosis not present

## 2018-12-11 DIAGNOSIS — R2681 Unsteadiness on feet: Secondary | ICD-10-CM

## 2018-12-11 DIAGNOSIS — H8113 Benign paroxysmal vertigo, bilateral: Secondary | ICD-10-CM | POA: Diagnosis not present

## 2018-12-11 DIAGNOSIS — G44309 Post-traumatic headache, unspecified, not intractable: Secondary | ICD-10-CM | POA: Diagnosis not present

## 2018-12-11 DIAGNOSIS — R2689 Other abnormalities of gait and mobility: Secondary | ICD-10-CM

## 2018-12-11 DIAGNOSIS — I10 Essential (primary) hypertension: Secondary | ICD-10-CM | POA: Diagnosis not present

## 2018-12-11 DIAGNOSIS — L03119 Cellulitis of unspecified part of limb: Secondary | ICD-10-CM | POA: Diagnosis not present

## 2018-12-11 DIAGNOSIS — F0781 Postconcussional syndrome: Secondary | ICD-10-CM | POA: Diagnosis not present

## 2018-12-11 DIAGNOSIS — E039 Hypothyroidism, unspecified: Secondary | ICD-10-CM | POA: Diagnosis not present

## 2018-12-11 NOTE — Patient Instructions (Addendum)
       Feet Together - Eyes Closed      With eyes closed and feet together.  Feel the weight in the middle of your feet.  Close your eyes and hold your balance while performing head movements up and down, 10 times.  Also perform head turns side to side, 10 times.   Perform 2 times a day.    Feet Heel-Toe "Tandem"    One hand touching counter for support if needed, walk a straight line bringing one foot directly in front of the other. Repeat 4 laps down and back along your counter. Do __2__ sessions per day.   Backward    In your hallway - walk quickly forwards to the end, stop and quickly change to walking backwards back to the start. Repeat 4 times and focus on walking and changing direction quickly  Copyright  VHI. All rights reserved.  Random Direction Head Motion    Walking on solid surface, normal speed.  Turn head and eyes left for 3 steps, right for 3 steps. Repeat looking up for 3 steps and then down for 3 steps. Perform 4 laps in hallway.

## 2018-12-12 DIAGNOSIS — E039 Hypothyroidism, unspecified: Secondary | ICD-10-CM | POA: Diagnosis not present

## 2018-12-12 DIAGNOSIS — R21 Rash and other nonspecific skin eruption: Secondary | ICD-10-CM | POA: Diagnosis not present

## 2018-12-12 DIAGNOSIS — I10 Essential (primary) hypertension: Secondary | ICD-10-CM | POA: Diagnosis not present

## 2018-12-12 DIAGNOSIS — Z20828 Contact with and (suspected) exposure to other viral communicable diseases: Secondary | ICD-10-CM | POA: Diagnosis not present

## 2018-12-12 NOTE — Therapy (Signed)
[facility] [address] [place], [place], [address] Phone: [phone]   Fax:  [phone]  Physical Therapy Treatment and D/C Summary  Patient Details  Name: Doris [name] MRN: [id number] Date of Birth: 23-Jun-1945 Referring Provider (PT): Audley Hose, MD   Encounter Date: 12/11/2018  PT End of Session - 12/12/18 1540    Visit Number  9    Number of Visits  13    Date for PT Re-Evaluation  12/26/18    Authorization Type  Medicare and Medicaid - 10th visit PN    PT Start Time  1706    PT Stop Time  1750    PT Time Calculation (min)  44 min    Activity Tolerance  Patient tolerated treatment well    Behavior During Therapy  [facility] for tasks assessed/performed       Past Medical History:  Diagnosis Date  . Acid reflux   . Back pain   . HLD (hyperlipidemia)   . Hypertension   . Insomnia   . Migraine   . Osteoarthritis    knee  . Psychosis ([place])    followed by Dr. Keenan Bachelor [phone]  . Thyroid nodule 2010   TSH 04/2008 = 0.296, no follow up noted in [facility]  . Vertigo     Past Surgical History:  Procedure Laterality Date  . TONSILLECTOMY    . UTERINE FIBROID SURGERY      There were no vitals filed for this visit.  Subjective Assessment - 12/11/18 1708    Subjective  Feels like she is doing well, cancelled the one visit after this visit.  Had to go to the doctor's office because she thinks she got bit by something.    Pertinent History  multi-canal BPPV, vertigo, thyroid nodule and hypothyroidism, OA, osteoporosis, depression with psychosis, tachycardia and palpitations, migraine, insomnia, HLD, back pain, HTN, coronary artery calcification, multiple falls with head injury, and latent TB    Limitations  Standing;Walking    Diagnostic tests  CT scan following most recent fall    Currently in Pain?  No/denies         [facility] PT Assessment - 12/11/18 1728      Functional Gait  Assessment   Gait  assessed   Yes    Gait Level Surface  Walks 20 ft in less than 7 sec but greater than 5.5 sec, uses assistive device, slower speed, mild gait deviations, or deviates 6-10 in outside of the 12 in walkway width.    Change in Gait Speed  Able to smoothly change walking speed without loss of balance or gait deviation. Deviate no more than 6 in outside of the 12 in walkway width.    Gait with Horizontal Head Turns  Performs head turns smoothly with slight change in gait velocity (eg, minor disruption to smooth gait path), deviates 6-10 in outside 12 in walkway width, or uses an assistive device.    Gait with Vertical Head Turns  Performs head turns with no change in gait. Deviates no more than 6 in outside 12 in walkway width.    Gait and Pivot Turn  Pivot turns safely within 3 sec and stops quickly with no loss of balance.    Step Over Obstacle  Is able to step over 2 stacked shoe boxes taped together (9 in total height) without changing gait speed. No evidence of imbalance.    Gait with Narrow Base of Support  Ambulates 4-7 steps.  Gait with Eyes Closed  Walks 20 ft, uses assistive device, slower speed, mild gait deviations, deviates 6-10 in outside 12 in walkway width. Ambulates 20 ft in less than 9 sec but greater than 7 sec.    Ambulating Backwards  Walks 20 ft, uses assistive device, slower speed, mild gait deviations, deviates 6-10 in outside 12 in walkway width.    Steps  Alternating feet, must use rail.    Total Score  23    FGA comment:  23/30         Vestibular Assessment - 12/12/18 1541      Positional Testing   Sidelying Test  Sidelying Right;Sidelying Left      Sidelying Right   Sidelying Right Duration  10 seconds    Sidelying Right Symptoms  Upbeat, right rotatory nystagmus      Sidelying Left   Sidelying Left Duration  0    Sidelying Left Symptoms  No nystagmus                Vestibular Treatment/Exercise - 12/12/18 1541      Vestibular Treatment/Exercise    Vestibular Treatment Provided  Canalith Repositioning    Canalith Repositioning  Epley Manuever Right       EPLEY MANUEVER RIGHT   Number of Reps   1    Overall Response  Improved Symptoms    Response Details   verbalized to pt how to correctly perform self manuever at home with pillow behind back due to pt not performing correctly      Nestor Lewandowsky   Number of Reps   1    Symptom Description   attempted to perform to review for HEP but pt unable to verbalize or return demonstrate; pt getting Nestor Lewandowsky confused with home Epley manuever which pt is more familiar with.  Returned to review of home [employer]            PT Education - 12/12/18 1539    Education Details  progress towards goals, final HEP, D/C today    Person(s) Educated  Patient    Methods  Explanation;Demonstration;Handout    Comprehension  Verbalized understanding;Returned demonstration             Feet Together - Eyes Closed      With eyes closed and feet together.  Feel the weight in the middle of your feet.  Close your eyes and hold your balance while performing head movements up and down, 10 times.  Also perform head turns side to side, 10 times.   Perform 2 times a day.    Feet Heel-Toe "Tandem"    One hand touching counter for support if needed, walk a straight line bringing one foot directly in front of the other. Repeat 4 laps down and back along your counter. Do __2__ sessions per day.   Backward    In your hallway - walk quickly forwards to the end, stop and quickly change to walking backwards back to the start. Repeat 4 times and focus on walking and changing direction quickly  Copyright  VHI. All rights reserved.  Random Direction Head Motion    Walking on solid surface, normal speed.  Turn head and eyes left for 3 steps, right for 3 steps. Repeat looking up for 3 steps and then down for 3 steps. Perform 4 laps in hallway.   PT Short Term Goals - 11/27/18 1628       PT SHORT TERM GOAL #1   Title  Pt will demonstrate independence with initial HEP    Time  4    Period  Weeks    Status  Achieved    Target Date  11/27/18      PT SHORT TERM GOAL #2   Title  Pt will participate in further assessment of balance and falls risk    Time  4    Period  Weeks    Status  Achieved    Target Date  11/27/18      PT SHORT TERM GOAL #3   Title  Pt will tolerate canalith repositioning manuevers for L and R side with resolution of symptoms    Time  4    Period  Weeks    Status  Achieved    Target Date  11/27/18      PT SHORT TERM GOAL #4   Title  Pt will participate in further assessment of vestibular impairments with MSQ or DVA    Baseline  Pt did not have her distance glasses today    Time  4    Period  Weeks    Status  Unable to assess    Target Date  11/27/18        PT Long Term Goals - 12/11/18 1711      PT LONG TERM GOAL #1   Title  Pt will demonstrate independence with vestibular and balance HEP  (LTG due 12/27/2018)    Time  8    Period  Weeks    Status  Achieved      PT LONG TERM GOAL #2   Title  Pt will report no symptoms of vertigo or motion sensitivity during bed mobility, ambulation and ADLs.    Time  8    Period  Weeks    Status  Achieved      PT LONG TERM GOAL #3   Title  Pt will demonstrate decreased falls risk as indicated by improvement in FGA by 4 points    Baseline  23/30    Time  8    Period  Weeks    Status  Achieved      PT LONG TERM GOAL #4   Title  Pt will demonstrate improved use of VOR as indicated by improvement in DVA to 2-3 line difference    Baseline  Deferred; not assessed due to pt not having glasses    Time  8    Period  Weeks    Status  Deferred      PT LONG TERM GOAL #5   Title  Pt will demonstrate negative positional testing for R and L side    Baseline  still symptomatic to R side; no symptoms to the L side    Time  8    Period  Weeks    Status  Not Met            Plan - 12/12/18  1543    Clinical Impression Statement  Treatment session today focused on assessment of progress towards LTG and review of final HEP.  Pt has made steady progress and has met 3/5 LTG, 4th goal deferred due to inability to assess DVA and 5th goal not met as pt continues to report vertigo and nystagmus with R hallpike-dix testing and R sidelying test despite multiple CRM as treatment.  Pt does report significant decrease in intensity and duration of symptoms and demonstrates less fear avoidance and anxiety with bed mobility and movement.  Pt demonstrates improvement in dynamic  gait and standing balance and demonstrates decreased falls risk.  Reviewed final HEP recommendations.  Pt agreeable and ready for D/C today.    Personal Factors and Comorbidities  Comorbidity 3+;Past/Current Experience;Time since onset of injury/illness/exacerbation;Transportation;Social Background    Comorbidities  multi-canal BPPV, vertigo, thyroid nodule and hypothyroidism, OA, osteoporosis, depression with psychosis,tachycardia and palpitations, migraine, insomnia, HLD, back pain, HTN, coronary artery calcification, multiple falls with head injury, and latent TB    Examination-Activity Limitations  Bed Mobility;Locomotion Level;Stairs;Transfers;Stand;Bend    Examination-Participation Restrictions  Driving;Community Activity;Cleaning    Stability/Clinical Decision Making  Evolving/Moderate complexity    Rehab Potential  Good    PT Frequency  2x / week    PT Duration  8 weeks    PT Treatment/Interventions  ADLs/Self Care Home Management;Canalith Repostioning;Gait training;Stair training;Functional mobility training;Therapeutic activities;Therapeutic exercise;Balance training;Neuromuscular re-education;Patient/family education;Vestibular;Passive range of motion;Manual techniques;Moist Heat;Cryotherapy;Dry needling    Consulted and Agree with Plan of Care  Patient       Patient will benefit from skilled therapeutic intervention in  order to improve the following deficits and impairments:  Decreased balance, Difficulty walking, Dizziness, Pain, Decreased activity tolerance, Decreased range of motion  Visit Diagnosis: Unsteadiness on feet  Other abnormalities of gait and mobility  Dizziness and giddiness  BPPV (benign paroxysmal positional vertigo), bilateral     Problem List Patient Active Problem List   Diagnosis Date Noted  . Impaired cognition 05/03/2018  . Vitamin D deficiency 12/08/2016  . Overweight 10/27/2016  . Postablative hypothyroidism 10/27/2016  . Prediabetes 10/27/2016  . Muscle spasms of neck 08/26/2016  . Abnormality of gait 08/10/2016  . Osteoporosis 10/13/2015  . Acrochordon 07/07/2015  . Jaw pain 12/30/2014  . Coronary artery calcification 12/23/2014  . Tachycardia 08/27/2014  . Cholelithiasis 05/17/2014  . GERD (gastroesophageal reflux disease) 01/02/2013  . TB lung, latent 05/01/2012  . Osteoarthritis 03/01/2012  . Severe major depression with psychotic features ([place]) 04/01/2011  . Hypothyroidism (acquired) 03/22/2011  . Healthcare maintenance 12/28/2010  . BPPV (benign paroxysmal positional vertigo) 11/19/2008  . Hyperlipidemia 11/30/2005  . Essential hypertension 11/30/2005  . LOW BACK PAIN 11/30/2005    PHYSICAL THERAPY DISCHARGE SUMMARY  Visits from Start of Care: 9  Current functional level related to goals / functional outcomes: Please see LTG achievement and impression statement above   Remaining deficits: R BPPV - unable to clear with multiple CRM; dizziness, imbalance   Education / Equipment: HEP  Plan: Patient agrees to discharge.  Patient goals were met. Patient is being discharged due to meeting the stated rehab goals.  ?????     Doris [name], PT, DPT 12/12/18    3:48 PM    Doris [name] [address] [place], [place], [address] Phone: [phone]   Fax:  [phone]  Name: Doris [name] MRN: [id number] Date of Birth: January 03, 1946

## 2018-12-13 ENCOUNTER — Ambulatory Visit: Payer: Medicare Other | Admitting: Physical Therapy

## 2019-01-09 DIAGNOSIS — F411 Generalized anxiety disorder: Secondary | ICD-10-CM | POA: Diagnosis not present

## 2019-01-09 DIAGNOSIS — F331 Major depressive disorder, recurrent, moderate: Secondary | ICD-10-CM | POA: Diagnosis not present

## 2019-01-23 DIAGNOSIS — Z23 Encounter for immunization: Secondary | ICD-10-CM | POA: Diagnosis not present

## 2019-03-08 DIAGNOSIS — M79644 Pain in right finger(s): Secondary | ICD-10-CM | POA: Diagnosis not present

## 2019-03-08 DIAGNOSIS — M25531 Pain in right wrist: Secondary | ICD-10-CM | POA: Diagnosis not present

## 2019-03-26 ENCOUNTER — Ambulatory Visit: Payer: Medicare Other | Attending: Internal Medicine

## 2019-03-26 DIAGNOSIS — Z23 Encounter for immunization: Secondary | ICD-10-CM | POA: Insufficient documentation

## 2019-03-26 NOTE — Progress Notes (Signed)
   Covid-19 Vaccination Clinic  Name:  Doris [name]    MRN: [id number] DOB: November 20, 1945  03/26/2019  Ms. Doris [name] was observed post Covid-19 immunization for 15 minutes without incidence. She was provided with Vaccine Information Sheet and instruction to access the V-Safe system.   Ms. Doris [name] was instructed to call 911 with any severe reactions post vaccine: Marland Kitchen Difficulty breathing  . Swelling of your face and throat  . A fast heartbeat  . A bad rash all over your body  . Dizziness and weakness    Immunizations Administered    Name Date Dose VIS Date Route   Pfizer COVID-19 Vaccine 03/26/2019 12:58 PM 0.3 mL 01/26/2019 Intramuscular   Manufacturer: [place]   Lot: [id number]   [place]: [id number]

## 2019-04-09 DIAGNOSIS — F411 Generalized anxiety disorder: Secondary | ICD-10-CM | POA: Diagnosis not present

## 2019-04-09 DIAGNOSIS — F331 Major depressive disorder, recurrent, moderate: Secondary | ICD-10-CM | POA: Diagnosis not present

## 2019-04-10 DIAGNOSIS — E559 Vitamin D deficiency, unspecified: Secondary | ICD-10-CM | POA: Diagnosis not present

## 2019-04-10 DIAGNOSIS — I10 Essential (primary) hypertension: Secondary | ICD-10-CM | POA: Diagnosis not present

## 2019-04-10 DIAGNOSIS — E039 Hypothyroidism, unspecified: Secondary | ICD-10-CM | POA: Diagnosis not present

## 2019-04-10 DIAGNOSIS — E782 Mixed hyperlipidemia: Secondary | ICD-10-CM | POA: Diagnosis not present

## 2019-04-11 DIAGNOSIS — H8111 Benign paroxysmal vertigo, right ear: Secondary | ICD-10-CM | POA: Diagnosis not present

## 2019-04-11 DIAGNOSIS — E782 Mixed hyperlipidemia: Secondary | ICD-10-CM | POA: Diagnosis not present

## 2019-04-11 DIAGNOSIS — Z13820 Encounter for screening for osteoporosis: Secondary | ICD-10-CM | POA: Diagnosis not present

## 2019-04-11 DIAGNOSIS — M810 Age-related osteoporosis without current pathological fracture: Secondary | ICD-10-CM | POA: Diagnosis not present

## 2019-04-11 DIAGNOSIS — M542 Cervicalgia: Secondary | ICD-10-CM | POA: Diagnosis not present

## 2019-04-11 DIAGNOSIS — Z1211 Encounter for screening for malignant neoplasm of colon: Secondary | ICD-10-CM | POA: Diagnosis not present

## 2019-04-11 DIAGNOSIS — Z23 Encounter for immunization: Secondary | ICD-10-CM | POA: Diagnosis not present

## 2019-04-11 DIAGNOSIS — M1712 Unilateral primary osteoarthritis, left knee: Secondary | ICD-10-CM | POA: Diagnosis not present

## 2019-04-11 DIAGNOSIS — Z1239 Encounter for other screening for malignant neoplasm of breast: Secondary | ICD-10-CM | POA: Diagnosis not present

## 2019-04-11 DIAGNOSIS — Z0001 Encounter for general adult medical examination with abnormal findings: Secondary | ICD-10-CM | POA: Diagnosis not present

## 2019-04-11 DIAGNOSIS — R296 Repeated falls: Secondary | ICD-10-CM | POA: Diagnosis not present

## 2019-04-19 ENCOUNTER — Ambulatory Visit: Payer: Medicare Other

## 2019-04-30 ENCOUNTER — Ambulatory Visit: Payer: Medicare Other | Attending: Internal Medicine

## 2019-04-30 DIAGNOSIS — Z23 Encounter for immunization: Secondary | ICD-10-CM

## 2019-04-30 NOTE — Progress Notes (Signed)
   Covid-19 Vaccination Clinic  Name:  Doris [name]    MRN: [id number] DOB: 01/02/1946  04/30/2019  [name] was observed post Covid-19 immunization for 15 minutes without incident. She was provided with Vaccine Information Sheet and instruction to access the V-Safe system.   [name] was instructed to call 911 with any severe reactions post vaccine: Marland Kitchen Difficulty breathing  . Swelling of face and throat  . A fast heartbeat  . A bad rash all over body  . Dizziness and weakness   Immunizations Administered    Name Date Dose VIS Date Route   Pfizer COVID-19 Vaccine 04/30/2019 12:17 PM 0.3 mL 01/26/2019 Intramuscular   Manufacturer: [place]   Lot: [id number]   [place]: [id number]

## 2019-05-17 DIAGNOSIS — R441 Visual hallucinations: Secondary | ICD-10-CM | POA: Diagnosis not present

## 2019-05-17 DIAGNOSIS — F419 Anxiety disorder, unspecified: Secondary | ICD-10-CM | POA: Diagnosis not present

## 2019-05-17 DIAGNOSIS — R519 Headache, unspecified: Secondary | ICD-10-CM | POA: Diagnosis not present

## 2019-06-11 DIAGNOSIS — R441 Visual hallucinations: Secondary | ICD-10-CM | POA: Diagnosis not present

## 2019-06-11 DIAGNOSIS — G4485 Primary stabbing headache: Secondary | ICD-10-CM | POA: Diagnosis not present

## 2019-06-11 DIAGNOSIS — Z1231 Encounter for screening mammogram for malignant neoplasm of breast: Secondary | ICD-10-CM | POA: Diagnosis not present

## 2019-06-15 ENCOUNTER — Ambulatory Visit (INDEPENDENT_AMBULATORY_CARE_PROVIDER_SITE_OTHER): Payer: Medicare Other | Admitting: Cardiology

## 2019-06-15 ENCOUNTER — Other Ambulatory Visit: Payer: Self-pay

## 2019-06-15 ENCOUNTER — Encounter: Payer: Self-pay | Admitting: Cardiology

## 2019-06-15 VITALS — BP 130/80 | HR 68 | Ht 64.0 in | Wt 163.0 lb

## 2019-06-15 DIAGNOSIS — R0609 Other forms of dyspnea: Secondary | ICD-10-CM

## 2019-06-15 DIAGNOSIS — R0600 Dyspnea, unspecified: Secondary | ICD-10-CM | POA: Diagnosis not present

## 2019-06-15 DIAGNOSIS — R000 Tachycardia, unspecified: Secondary | ICD-10-CM | POA: Diagnosis not present

## 2019-06-15 NOTE — Patient Instructions (Signed)
Medication Instructions:  The current medical regimen is effective;  continue present plan and medications.  *If you need a refill on your cardiac medications before your next appointment, please call your pharmacy*  Testing/Procedures: Your physician has requested that you have an echocardiogram. Echocardiography is a painless test that uses sound waves to create images of your heart. It provides your doctor with information about the size and shape of your heart and how well your heart's chambers and valves are working. This procedure takes approximately one hour. There are no restrictions for this procedure.  Follow-Up: At [facility], you and your health needs are our priority.  As part of our continuing mission to provide you with exceptional heart care, we have created designated Provider Care Teams.  These Care Teams include your primary Cardiologist (physician) and Advanced Practice Providers (APPs -  Physician Assistants and Nurse Practitioners) who all work together to provide you with the care you need, when you need it.  We recommend signing up for the patient portal called "MyChart".  Sign up information is provided on this After Visit Summary.  MyChart is used to connect with patients for Virtual Visits (Telemedicine).  Patients are able to view lab/test results, encounter notes, upcoming appointments, etc.  Non-urgent messages can be sent to your provider as well.   To learn more about what you can do with MyChart, go to [email or web address].    Your next appointment:   12 month(s)  The format for your next appointment:   In Person  Provider:   Candee Furbish, MD   Thank you for choosing [facility]!!    Biotin Hair,Skin and Nails - both over the counter supplements

## 2019-06-15 NOTE — Progress Notes (Signed)
Cardiology Office Note:    Date:  06/15/2019   ID:  Shane Velic, DOB 1945-04-11, MRN [id number]  PCP:  Audley Hose, MD  Cardiologist:  Candee Furbish, MD  Electrophysiologist:  None   Referring MD: Audley Hose, MD     History of Present Illness:    Doris [name] is a 74 y.o. female here for the follow-up of tachycardia.  Back in 2016 reported palpitation shortness of breath for many years.  No chest pain or loss of consciousness.  Free T4 was normal.  She did have a CT scan of the chest which showed coronary artery calcification of the LAD and RCA.  Low-dose metoprolol was started seem to help a little bit.  Feels dehydrated today. Still with dyspnea. Feels palps with increased HR. heart rate today is 68 for instance.  She thinks it is because she is a bit dehydrated.  She only had a boost shake this morning.  She states that she did have a fall, suffered a concussion.  She is going for her fourth brain MRI.  She still feels the sensation of shortness of breath at times when talking with people intermittently.  No chest pain.  Past Medical History:  Diagnosis Date  . Acid reflux   . Back pain   . HLD (hyperlipidemia)   . Hypertension   . Insomnia   . Migraine   . Osteoarthritis    knee  . Psychosis ([place])    followed by Dr. Keenan Bachelor [phone]  . Thyroid nodule 2010   TSH 04/2008 = 0.296, no follow up noted in [facility]  . Vertigo     Past Surgical History:  Procedure Laterality Date  . TONSILLECTOMY    . UTERINE FIBROID SURGERY      Current Medications: Current Meds  Medication Sig  . ALPRAZolam (XANAX) 0.25 MG tablet Take 0.25 mg by mouth at bedtime as needed for anxiety.  Marland Kitchen amitriptyline (ELAVIL) 10 MG tablet amitriptyline 10 mg tablet  Take 1 tablet as needed by oral route at bedtime.  Marland Kitchen amLODipine (NORVASC) 5 MG tablet TAKE 1 TABLET BY MOUTH EVERY DAY  . cyclobenzaprine (FLEXERIL) 5 MG tablet cyclobenzaprine 5 mg tablet  Take 1 tablet twice  a day by oral route as needed.  . DULoxetine (CYMBALTA) 60 MG capsule Take by mouth daily.  . hydrOXYzine (ATARAX/VISTARIL) 10 MG tablet Take 10 mg by mouth.   . levothyroxine (SYNTHROID, LEVOTHROID) 25 MCG tablet Take 25 mcg by mouth daily before breakfast.  . meclizine (ANTIVERT) 25 MG tablet Take 25 mg by mouth 3 (three) times daily as needed for dizziness or nausea.  . meloxicam (MOBIC) 15 MG tablet Take 30 mg by mouth daily.   . metoprolol tartrate (LOPRESSOR) 50 MG tablet TAKE 1 TABLET BY MOUTH TWICE A DAY  . Olopatadine HCl (PAZEO) 0.7 % SOLN Place 1 drop into both eyes daily.   Marland Kitchen omeprazole (PRILOSEC) 40 MG capsule Take 40 mg by mouth daily.  . rosuvastatin (CRESTOR) 10 MG tablet rosuvastatin 10 mg tablet  TAKE 1 TABLET BY MOUTH EVERYDAY AT BEDTIME  . traMADol (ULTRAM) 50 MG tablet tramadol 50 mg tablet  TAKE 1 TABLET EVERY 6 HOURS BY ORAL ROUTE FOR 10 DAYS.     Allergies:   Ibuprofen, Lidocaine, and Olanzapine   Social History   Socioeconomic History  . Marital status: Divorced    Spouse name: Not on file  . Number of children: Not on file  . Years of education: Not  on file  . Highest education level: Not on file  Occupational History  . Occupation: retired  Tobacco Use  . Smoking status: Never Smoker  . Smokeless tobacco: Never Used  Substance and Sexual Activity  . Alcohol use: No    Alcohol/week: 0.0 standard drinks  . Drug use: No  . Sexual activity: Yes  Other Topics Concern  . Not on file  Social History Narrative  . Not on file   Social Determinants of Health   Financial Resource Strain:   . Difficulty of Paying Living Expenses:   Food Insecurity:   . Worried About Charity fundraiser in the Last Year:   . Arboriculturist in the Last Year:   Transportation Needs:   . Film/video editor (Medical):   Marland Kitchen Lack of Transportation (Non-Medical):   Physical Activity:   . Days of Exercise per Week:   . Minutes of Exercise per Session:   Stress:   .  Feeling of Stress :   Social Connections:   . Frequency of Communication with Friends and Family:   . Frequency of Social Gatherings with Friends and Family:   . Attends Religious Services:   . Active Member of Clubs or Organizations:   . Attends Archivist Meetings:   Marland Kitchen Marital Status:      Family History: The patient's family history includes Heart disease in her father, mother, and another family member. There is no history of Stroke, Cancer, or Colon cancer.  ROS:   Please see the history of present illness.    Denies any syncope bleeding orthopnea PND all other systems reviewed and are negative.  EKGs/Labs/Other Studies Reviewed:    The following studies were reviewed today:  ECho 2016 - LVEF 55-60%, normal wall thickness, normal wall motion, diastolic  dysfunction, normal LV filling pressure, aortic valve sclerosis,  mild MR, mobile IAS, normal IVC  EKG:  EKG is  ordered today.  The ekg ordered today demonstrates sinus rhythm 68 with no other abnormalities.  Nonspecific T wave flattening  Recent Labs: No results found for requested labs within last 8760 hours.  Recent Lipid Panel    Component Value Date/Time   CHOL 196 05/17/2014 1527   TRIG 168 (H) 05/17/2014 1527   HDL 68 05/17/2014 1527   CHOLHDL 2.9 05/17/2014 1527   VLDL 34 05/17/2014 1527   LDLCALC 94 05/17/2014 1527    Physical Exam:    VS:  BP 130/80   Pulse 68   Ht 5\' 4"  (1.626 m)   Wt 163 lb (73.9 kg)   LMP 03/27/1984   BMI 27.98 kg/m     Wt Readings from Last 3 Encounters:  06/15/19 163 lb (73.9 kg)  10/22/18 165 lb (74.8 kg)  07/24/18 158 lb (71.7 kg)     GEN:  Well nourished, well developed in no acute distress HEENT: Normal NECK: No JVD; No carotid bruits LYMPHATICS: No lymphadenopathy CARDIAC: RRR, no murmurs, rubs, gallops RESPIRATORY:  Clear to auscultation without rales, wheezing or rhonchi  ABDOMEN: Soft, non-tender, non-distended MUSCULOSKELETAL:  No edema; No  deformity  SKIN: Warm and dry NEUROLOGIC:  Alert and oriented x 3 PSYCHIATRIC:  Normal affect   ASSESSMENT:    1. Dyspnea on exertion   2. Tachycardia    PLAN:    In order of problems listed above:  Tachycardia -Continue with metoprolol.  She is worried that this is causing some hair to fall out.  She may wish to try  the supplement biotin to help her with her hair.  She did try to cut back on the metoprolol but thought that that was a bad idea.  Dyspnea -Seems to be longstanding.  It has been several years since we have checked an echocardiogram.  I will go ahead and repeat to make sure there is been no change in overall pump function of her heart.  Hyperlipidemia -Continue with Crestor 10 mg a day.  Excellent.   Medication Adjustments/Labs and Tests Ordered: Current medicines are reviewed at length with the patient today.  Concerns regarding medicines are outlined above.  Orders Placed This Encounter  Procedures  . EKG 12-Lead  . ECHOCARDIOGRAM COMPLETE   No orders of the defined types were placed in this encounter.   Patient Instructions  Medication Instructions:  The current medical regimen is effective;  continue present plan and medications.  *If you need a refill on your cardiac medications before your next appointment, please call your pharmacy*  Testing/Procedures: Your physician has requested that you have an echocardiogram. Echocardiography is a painless test that uses sound waves to create images of your heart. It provides your doctor with information about the size and shape of your heart and how well your heart's chambers and valves are working. This procedure takes approximately one hour. There are no restrictions for this procedure.  Follow-Up: At [facility], you and your health needs are our priority.  As part of our continuing mission to provide you with exceptional heart care, we have created designated Provider Care Teams.  These Care Teams include  your primary Cardiologist (physician) and Advanced Practice Providers (APPs -  Physician Assistants and Nurse Practitioners) who all work together to provide you with the care you need, when you need it.  We recommend signing up for the patient portal called "MyChart".  Sign up information is provided on this After Visit Summary.  MyChart is used to connect with patients for Virtual Visits (Telemedicine).  Patients are able to view lab/test results, encounter notes, upcoming appointments, etc.  Non-urgent messages can be sent to your provider as well.   To learn more about what you can do with MyChart, go to [email or web address].    Your next appointment:   12 month(s)  The format for your next appointment:   In Person  Provider:   Candee Furbish, MD   Thank you for choosing [facility]!!    Biotin Hair,Skin and Nails - both over the counter supplements    Signed, Candee Furbish, MD  06/15/2019 3:18 PM    [place]

## 2019-07-02 DIAGNOSIS — G4485 Primary stabbing headache: Secondary | ICD-10-CM | POA: Diagnosis not present

## 2019-07-04 DIAGNOSIS — F331 Major depressive disorder, recurrent, moderate: Secondary | ICD-10-CM | POA: Diagnosis not present

## 2019-07-04 DIAGNOSIS — F411 Generalized anxiety disorder: Secondary | ICD-10-CM | POA: Diagnosis not present

## 2019-07-06 ENCOUNTER — Other Ambulatory Visit: Payer: Self-pay

## 2019-07-06 ENCOUNTER — Ambulatory Visit (HOSPITAL_COMMUNITY): Payer: Medicare Other | Attending: Cardiology

## 2019-07-06 DIAGNOSIS — R0600 Dyspnea, unspecified: Secondary | ICD-10-CM | POA: Diagnosis not present

## 2019-07-06 DIAGNOSIS — R0609 Other forms of dyspnea: Secondary | ICD-10-CM

## 2019-07-09 DIAGNOSIS — R441 Visual hallucinations: Secondary | ICD-10-CM | POA: Diagnosis not present

## 2019-07-09 DIAGNOSIS — R419 Unspecified symptoms and signs involving cognitive functions and awareness: Secondary | ICD-10-CM | POA: Diagnosis not present

## 2019-07-09 DIAGNOSIS — G4485 Primary stabbing headache: Secondary | ICD-10-CM | POA: Diagnosis not present

## 2019-07-19 ENCOUNTER — Encounter: Payer: Self-pay | Admitting: *Deleted

## 2019-09-03 DIAGNOSIS — R441 Visual hallucinations: Secondary | ICD-10-CM | POA: Diagnosis not present

## 2019-09-03 DIAGNOSIS — R419 Unspecified symptoms and signs involving cognitive functions and awareness: Secondary | ICD-10-CM | POA: Diagnosis not present

## 2019-09-03 DIAGNOSIS — G4485 Primary stabbing headache: Secondary | ICD-10-CM | POA: Diagnosis not present

## 2019-09-13 ENCOUNTER — Telehealth: Payer: Self-pay | Admitting: Cardiology

## 2019-09-13 MED ORDER — METOPROLOL SUCCINATE ER 25 MG PO TB24: 25.0000 mg | ORAL_TABLET | Freq: Every day | ORAL | 3 refills | Status: DC

## 2019-09-13 MED ORDER — LOSARTAN POTASSIUM 50 MG PO TABS
50.0000 mg | ORAL_TABLET | Freq: Every day | ORAL | 3 refills | Status: DC
Start: 2019-09-13 — End: 2019-10-01

## 2019-09-13 NOTE — Telephone Encounter (Signed)
Lm to cb.

## 2019-09-13 NOTE — Telephone Encounter (Signed)
Patient said that she had an ECHO and it said her heart had low normal pump function. Said that before when she exercised on the bike, her heartrate would go up to over 100. Now she does it, and it seems like she isn't doing anything and stay around the same. Wants to know if that is normal. Please call to discuss.

## 2019-09-13 NOTE — Addendum Note (Signed)
Addended by: Shellia Cleverly on: 09/13/2019 05:25 PM   Modules accepted: Orders

## 2019-09-13 NOTE — Telephone Encounter (Signed)
07/09/2019 9:00 AM EDT     Low normal pump function, overall reassuring. No significant valvular issues. Continue current management.  Candee Furbish, MD

## 2019-09-13 NOTE — Telephone Encounter (Signed)
Lets the metoprolol over to Toprol-XL 25 mg once a day Start losartan 50 mg once a day This should help blood pressure.  Have her track her blood pressures and blood see if we can make an appointment in 1 month with APP to make sure things are going well.  Candee Furbish, MD

## 2019-09-13 NOTE — Telephone Encounter (Signed)
Spoke with patient who is concerned her HR is not increasing appropriately with exercise.  HR is normally in the 70s and lately when she uses her exercise bike it does not increase.  BP has been running around 140/90 and she is only taking Lopressor once a day.  She decreased it on her own d/t hair loss.  She is taking Amlodipine 5 mg daily.advised I will have Dr Marlou Porch review and c/b with any new recommendations/orders.

## 2019-09-13 NOTE — Telephone Encounter (Signed)
Follow Up  Patient returning call. Transferred to [facility]

## 2019-09-13 NOTE — Telephone Encounter (Signed)
Pt aware of Dr Marlou Porch orders.  She will monitor BP and keep a diary of them to bring with her to her f/u appt.  She will c/b before her appt if she has any concerns or needs.

## 2019-09-18 ENCOUNTER — Telehealth: Payer: Self-pay | Admitting: Cardiology

## 2019-09-18 NOTE — Telephone Encounter (Signed)
Pt c/o medication issue:  1. Name of Medication:  metoprolol succinate (TOPROL-XL) 25 MG 24 hr tablet amLODipine (NORVASC) 5 MG tablet losartan (COZAAR) 50 MG tablet  2. How are you currently taking this medication (dosage and times per day)? All medications take 1 tablet daily  3. Are you having a reaction (difficulty breathing--STAT)? no  4. What is your medication issue? Patient states she recently started losartan. She would like to know if she needs to take the metoprolol and amlodipine as well.

## 2019-09-18 NOTE — Telephone Encounter (Signed)
Spoke with patient and reviewed her medications.  Advised her to continue medications as listed.  She reports she did not get the RX for Metoprolol Succinate but will contact her pharmacy.  Advised to c/b with any further questions/concerns.

## 2019-09-25 DIAGNOSIS — F331 Major depressive disorder, recurrent, moderate: Secondary | ICD-10-CM | POA: Diagnosis not present

## 2019-09-25 DIAGNOSIS — F411 Generalized anxiety disorder: Secondary | ICD-10-CM | POA: Diagnosis not present

## 2019-10-01 ENCOUNTER — Other Ambulatory Visit: Payer: Self-pay

## 2019-10-01 MED ORDER — LOSARTAN POTASSIUM 50 MG PO TABS: 50.0000 mg | ORAL_TABLET | Freq: Every day | ORAL | 2 refills | Status: DC

## 2019-10-01 MED ORDER — METOPROLOL SUCCINATE ER 25 MG PO TB24: 25.0000 mg | ORAL_TABLET | Freq: Every day | ORAL | 2 refills | Status: DC

## 2019-10-25 ENCOUNTER — Other Ambulatory Visit: Payer: Self-pay

## 2019-10-25 ENCOUNTER — Ambulatory Visit (INDEPENDENT_AMBULATORY_CARE_PROVIDER_SITE_OTHER): Payer: Medicare Other | Admitting: Cardiology

## 2019-10-25 ENCOUNTER — Encounter: Payer: Self-pay | Admitting: Cardiology

## 2019-10-25 VITALS — BP 136/74 | HR 82 | Ht 64.0 in | Wt 168.0 lb

## 2019-10-25 DIAGNOSIS — I2584 Coronary atherosclerosis due to calcified coronary lesion: Secondary | ICD-10-CM

## 2019-10-25 DIAGNOSIS — R000 Tachycardia, unspecified: Secondary | ICD-10-CM | POA: Diagnosis not present

## 2019-10-25 DIAGNOSIS — R002 Palpitations: Secondary | ICD-10-CM | POA: Diagnosis not present

## 2019-10-25 DIAGNOSIS — I2510 Atherosclerotic heart disease of native coronary artery without angina pectoris: Secondary | ICD-10-CM

## 2019-10-25 DIAGNOSIS — E7800 Pure hypercholesterolemia, unspecified: Secondary | ICD-10-CM | POA: Diagnosis not present

## 2019-10-25 NOTE — Patient Instructions (Signed)

## 2019-10-25 NOTE — Progress Notes (Signed)
Cardiology Office Note:    Date:  10/25/2019   ID:  Doris [name], DOB March 31, 1945, MRN [id number]  PCP:  Audley Hose, MD  [facility] Cardiologist:  Candee Furbish, MD  [facility] Electrophysiologist:  None   Referring MD: Audley Hose, MD    History of Present Illness:    Doris [name] is a 74 y.o. female here for follow-up of blood pressure, hypertension.  On 09/13/2019 telephone note was reviewed, we decided to start the losartan at 50 mg once a day and converted metoprolol over to Toprol-XL 25 mg once a day.  Instructed her to track her blood pressures over a month.  Denies any chest pain fevers chills nausea vomiting syncope bleeding.  Was previously seen back in April for follow-up of tachycardia.  Has had palpitations and shortness of breath for many years.  Thyroid function was normal.  She also has coronary artery calcification of the LAD and RCA seen on prior CT scan.  Overall low-dose metoprolol seems to help slightly.  She has had several brain MRIs, had had a fall, suffered a concussion in the past.  Past Medical History:  Diagnosis Date  . Acid reflux   . Back pain   . HLD (hyperlipidemia)   . Hypertension   . Insomnia   . Migraine   . Osteoarthritis    knee  . Psychosis ([place])    followed by Dr. Keenan Bachelor [phone]  . Thyroid nodule 2010   TSH 04/2008 = 0.296, no follow up noted in [facility]  . Vertigo     Past Surgical History:  Procedure Laterality Date  . TONSILLECTOMY    . UTERINE FIBROID SURGERY      Current Medications: Current Meds  Medication Sig  . ALPRAZolam (XANAX) 0.25 MG tablet Take 0.25 mg by mouth at bedtime as needed for anxiety.  Marland Kitchen amLODipine (NORVASC) 5 MG tablet TAKE 1 TABLET BY MOUTH EVERY DAY  . DULoxetine (CYMBALTA) 60 MG capsule Take by mouth daily.  . hydrOXYzine (ATARAX/VISTARIL) 10 MG tablet Take 10 mg by mouth.   . levothyroxine (SYNTHROID, LEVOTHROID) 25 MCG tablet Take 25 mcg by mouth daily before  breakfast.  . losartan (COZAAR) 50 MG tablet Take 1 tablet (50 mg total) by mouth daily.  . meclizine (ANTIVERT) 25 MG tablet Take 25 mg by mouth 3 (three) times daily as needed for dizziness or nausea.  . meloxicam (MOBIC) 15 MG tablet Take 30 mg by mouth daily.   . metoprolol succinate (TOPROL-XL) 25 MG 24 hr tablet Take 1 tablet (25 mg total) by mouth daily.  Marland Kitchen omeprazole (PRILOSEC) 40 MG capsule Take 40 mg by mouth daily.  . rosuvastatin (CRESTOR) 10 MG tablet rosuvastatin 10 mg tablet  TAKE 1 TABLET BY MOUTH EVERYDAY AT BEDTIME  . traMADol (ULTRAM) 50 MG tablet tramadol 50 mg tablet  TAKE 1 TABLET EVERY 6 HOURS BY ORAL ROUTE FOR 10 DAYS.     Allergies:   Ibuprofen, Lidocaine, and Olanzapine   Social History   Socioeconomic History  . Marital status: Divorced    Spouse name: Not on file  . Number of children: Not on file  . Years of education: Not on file  . Highest education level: Not on file  Occupational History  . Occupation: retired  Tobacco Use  . Smoking status: Never Smoker  . Smokeless tobacco: Never Used  Vaping Use  . Vaping Use: Never used  Substance and Sexual Activity  . Alcohol use: No  Alcohol/week: 0.0 standard drinks  . Drug use: No  . Sexual activity: Yes  Other Topics Concern  . Not on file  Social History Narrative  . Not on file   Social Determinants of Health   Financial Resource Strain:   . Difficulty of Paying Living Expenses: Not on file  Food Insecurity:   . Worried About Charity fundraiser in the Last Year: Not on file  . Ran Out of Food in the Last Year: Not on file  Transportation Needs:   . Lack of Transportation (Medical): Not on file  . Lack of Transportation (Non-Medical): Not on file  Physical Activity:   . Days of Exercise per Week: Not on file  . Minutes of Exercise per Session: Not on file  Stress:   . Feeling of Stress : Not on file  Social Connections:   . Frequency of Communication with Friends and Family: Not on  file  . Frequency of Social Gatherings with Friends and Family: Not on file  . Attends Religious Services: Not on file  . Active Member of Clubs or Organizations: Not on file  . Attends Archivist Meetings: Not on file  . Marital Status: Not on file     Family History: The patient's family history includes Heart disease in her father, mother, and another family member. There is no history of Stroke, Cancer, or Colon cancer.  ROS:   Please see the history of present illness.     All other systems reviewed and are negative.  EKGs/Labs/Other Studies Reviewed:    The following studies were reviewed today: Echocardiogram 07/06/2019-EF 22% grade 1 diastolic dysfunction.  Overall reassuring.   Recent Labs: No results found for requested labs within last 8760 hours.  Recent Lipid Panel    Component Value Date/Time   CHOL 196 05/17/2014 1527   TRIG 168 (H) 05/17/2014 1527   HDL 68 05/17/2014 1527   CHOLHDL 2.9 05/17/2014 1527   VLDL 34 05/17/2014 1527   LDLCALC 94 05/17/2014 1527    Physical Exam:    VS:  BP 136/74   Pulse 82   Ht 5\' 4"  (1.626 m)   Wt 168 lb (76.2 kg)   LMP 03/27/1984   SpO2 96%   BMI 28.84 kg/m     Wt Readings from Last 3 Encounters:  10/25/19 168 lb (76.2 kg)  06/15/19 163 lb (73.9 kg)  10/22/18 165 lb (74.8 kg)     GEN:  Well nourished, well developed in no acute distress HEENT: Normal NECK: No JVD; No carotid bruits LYMPHATICS: No lymphadenopathy CARDIAC: RRR, no murmurs, rubs, gallops RESPIRATORY:  Clear to auscultation without rales, wheezing or rhonchi  ABDOMEN: Soft, non-tender, non-distended MUSCULOSKELETAL:  No edema; No deformity  SKIN: Warm and dry NEUROLOGIC:  Alert and oriented x 3 PSYCHIATRIC:  Normal affect   ASSESSMENT:    1. Tachycardia   2. Palpitations   3. Pure hypercholesterolemia   4. Coronary artery calcification    PLAN:    In order of problems listed above:  Essential hypertension -Recently been  struggling with this.  Losartan 50 mg had been started.  Toprol have been converted to XL 25 mg.  She has been monitoring closely at home.  Showed me a log.  Overall I am pleased with the numbers.  She is pleased as well.  No side effects.  Tachycardia -Longstanding issue.  Continue with Toprol. -She is worried about potential side effects of this medication including hair loss.  Talked about biotin supplement. Discussed with her and pharmacy team here.  Should be okay to continue with Cymbalta and Toprol together.  Cymbalta may slightly increase plasma concentrations of metoprolol.  She is on a low-dose of metoprolol.  Dyspnea -Overall longstanding.  EF 50% on most recent echocardiogram on 07/06/2019 reviewed.  Overall reassuring.  Prior nuclear stress test in 2016 showed normal EF of 63%.  No ischemia.  Coronary artery calcification -Noted in LAD and RCA on prior CT scan of chest.  Continue with aggressive secondary risk factor prevention.  Continue with Crestor, 10 mg at this time.  Goal LDL will be less than 70.  Obviously Crestor can be increased if necessary.  Medication Adjustments/Labs and Tests Ordered: Current medicines are reviewed at length with the patient today.  Concerns regarding medicines are outlined above.  No orders of the defined types were placed in this encounter.  No orders of the defined types were placed in this encounter.   Patient Instructions  Medication Instructions:  The current medical regimen is effective;  continue present plan and medications.  *If you need a refill on your cardiac medications before your next appointment, please call your pharmacy*  Follow-Up: At [facility], you and your health needs are our priority.  As part of our continuing mission to provide you with exceptional heart care, we have created designated Provider Care Teams.  These Care Teams include your primary Cardiologist (physician) and Advanced Practice Providers (APPs -   Physician Assistants and Nurse Practitioners) who all work together to provide you with the care you need, when you need it.  We recommend signing up for the patient portal called "MyChart".  Sign up information is provided on this After Visit Summary.  MyChart is used to connect with patients for Virtual Visits (Telemedicine).  Patients are able to view lab/test results, encounter notes, upcoming appointments, etc.  Non-urgent messages can be sent to your provider as well.   To learn more about what you can do with MyChart, go to [email or web address].    Your next appointment:   6 month(s)  The format for your next appointment:   In Person  Provider:   Candee Furbish, MD  Thank you for choosing [facility]!!         Signed, Candee Furbish, MD  10/25/2019 10:50 AM    [place]

## 2019-11-07 DIAGNOSIS — R000 Tachycardia, unspecified: Secondary | ICD-10-CM | POA: Diagnosis not present

## 2019-11-07 DIAGNOSIS — I10 Essential (primary) hypertension: Secondary | ICD-10-CM | POA: Diagnosis not present

## 2019-11-07 DIAGNOSIS — R7303 Prediabetes: Secondary | ICD-10-CM | POA: Diagnosis not present

## 2019-11-07 DIAGNOSIS — R296 Repeated falls: Secondary | ICD-10-CM | POA: Diagnosis not present

## 2019-11-07 DIAGNOSIS — E039 Hypothyroidism, unspecified: Secondary | ICD-10-CM | POA: Diagnosis not present

## 2019-11-07 DIAGNOSIS — M1712 Unilateral primary osteoarthritis, left knee: Secondary | ICD-10-CM | POA: Diagnosis not present

## 2019-11-07 DIAGNOSIS — M810 Age-related osteoporosis without current pathological fracture: Secondary | ICD-10-CM | POA: Diagnosis not present

## 2019-11-07 DIAGNOSIS — I2584 Coronary atherosclerosis due to calcified coronary lesion: Secondary | ICD-10-CM | POA: Diagnosis not present

## 2019-11-07 DIAGNOSIS — E663 Overweight: Secondary | ICD-10-CM | POA: Diagnosis not present

## 2019-11-07 DIAGNOSIS — E782 Mixed hyperlipidemia: Secondary | ICD-10-CM | POA: Diagnosis not present

## 2019-11-07 DIAGNOSIS — R4189 Other symptoms and signs involving cognitive functions and awareness: Secondary | ICD-10-CM | POA: Diagnosis not present

## 2019-11-07 DIAGNOSIS — E559 Vitamin D deficiency, unspecified: Secondary | ICD-10-CM | POA: Diagnosis not present

## 2019-11-26 DIAGNOSIS — R419 Unspecified symptoms and signs involving cognitive functions and awareness: Secondary | ICD-10-CM | POA: Diagnosis not present

## 2019-11-26 DIAGNOSIS — G44229 Chronic tension-type headache, not intractable: Secondary | ICD-10-CM | POA: Diagnosis not present

## 2019-12-18 DIAGNOSIS — F331 Major depressive disorder, recurrent, moderate: Secondary | ICD-10-CM | POA: Diagnosis not present

## 2019-12-18 DIAGNOSIS — F411 Generalized anxiety disorder: Secondary | ICD-10-CM | POA: Diagnosis not present

## 2019-12-19 DIAGNOSIS — Z23 Encounter for immunization: Secondary | ICD-10-CM | POA: Diagnosis not present

## 2020-01-03 DIAGNOSIS — E782 Mixed hyperlipidemia: Secondary | ICD-10-CM | POA: Diagnosis not present

## 2020-01-03 DIAGNOSIS — I10 Essential (primary) hypertension: Secondary | ICD-10-CM | POA: Diagnosis not present

## 2020-01-03 DIAGNOSIS — E559 Vitamin D deficiency, unspecified: Secondary | ICD-10-CM | POA: Diagnosis not present

## 2020-01-03 DIAGNOSIS — R7303 Prediabetes: Secondary | ICD-10-CM | POA: Diagnosis not present

## 2020-01-03 DIAGNOSIS — E039 Hypothyroidism, unspecified: Secondary | ICD-10-CM | POA: Diagnosis not present

## 2020-01-04 DIAGNOSIS — E559 Vitamin D deficiency, unspecified: Secondary | ICD-10-CM | POA: Diagnosis not present

## 2020-01-04 DIAGNOSIS — E663 Overweight: Secondary | ICD-10-CM | POA: Diagnosis not present

## 2020-01-04 DIAGNOSIS — E782 Mixed hyperlipidemia: Secondary | ICD-10-CM | POA: Diagnosis not present

## 2020-01-04 DIAGNOSIS — H8113 Benign paroxysmal vertigo, bilateral: Secondary | ICD-10-CM | POA: Diagnosis not present

## 2020-01-04 DIAGNOSIS — M25511 Pain in right shoulder: Secondary | ICD-10-CM | POA: Diagnosis not present

## 2020-01-04 DIAGNOSIS — R7303 Prediabetes: Secondary | ICD-10-CM | POA: Diagnosis not present

## 2020-01-14 DIAGNOSIS — Z23 Encounter for immunization: Secondary | ICD-10-CM | POA: Diagnosis not present

## 2020-04-29 ENCOUNTER — Encounter: Payer: Self-pay | Admitting: Cardiology

## 2020-04-29 ENCOUNTER — Other Ambulatory Visit: Payer: Self-pay

## 2020-04-29 ENCOUNTER — Ambulatory Visit (INDEPENDENT_AMBULATORY_CARE_PROVIDER_SITE_OTHER): Payer: Medicare Other | Admitting: Cardiology

## 2020-04-29 VITALS — BP 130/80 | HR 78 | Ht 64.0 in | Wt 168.0 lb

## 2020-04-29 DIAGNOSIS — I2510 Atherosclerotic heart disease of native coronary artery without angina pectoris: Secondary | ICD-10-CM

## 2020-04-29 DIAGNOSIS — R002 Palpitations: Secondary | ICD-10-CM | POA: Diagnosis not present

## 2020-04-29 DIAGNOSIS — I2584 Coronary atherosclerosis due to calcified coronary lesion: Secondary | ICD-10-CM

## 2020-04-29 DIAGNOSIS — E7800 Pure hypercholesterolemia, unspecified: Secondary | ICD-10-CM | POA: Diagnosis not present

## 2020-04-29 DIAGNOSIS — R000 Tachycardia, unspecified: Secondary | ICD-10-CM | POA: Diagnosis not present

## 2020-04-29 NOTE — Patient Instructions (Signed)
Medication Instructions:  The current medical regimen is effective;  continue present plan and medications.  *If you need a refill on your cardiac medications before your next appointment, please call your pharmacy*  Follow-Up: At [facility], you and your health needs are our priority.  As part of our continuing mission to provide you with exceptional heart care, we have created designated Provider Care Teams.  These Care Teams include your primary Cardiologist (physician) and Advanced Practice Providers (APPs -  Physician Assistants and Nurse Practitioners) who all work together to provide you with the care you need, when you need it.  We recommend signing up for the patient portal called "MyChart".  Sign up information is provided on this After Visit Summary.  MyChart is used to connect with patients for Virtual Visits (Telemedicine).  Patients are able to view lab/test results, encounter notes, upcoming appointments, etc.  Non-urgent messages can be sent to your provider as well.   To learn more about what you can do with MyChart, go to [email or web address].    Your next appointment:   12 month(s)  The format for your next appointment:   In Person  Provider:   Mark Skains, MD   Thank you for choosing [facility]!!      

## 2020-04-29 NOTE — Progress Notes (Signed)
Cardiology Office Note:    Date:  04/29/2020   ID:  [name], DOB 02-21-1945, MRN [id number]  PCP:  Audley Hose, MD   [place]  Cardiologist:  Candee Furbish, MD  Advanced Practice Provider:  No care team member to display Electrophysiologist:  None       Referring MD: Audley Hose, MD     History of Present Illness:    Doris [name] is a 75 y.o. female here for follow-up of hypertension blood pressure coronary calcification of LAD and RCA seen on prior CT scan.  Overall doing well without any fever chills nausea vomiting syncope.  Has had several brain MRIs, falls, suffered a concussion in the past.  She has had tachycardia and palpitations and shortness of breath for many years.  States that she has been doing much better with the Toprol.  Thyroid function has been normal.  She is also on losartan as well.  Has been under more anxiety recently.  Her ex-husband was just recently diagnosed with cancer and it has been challenging for her.  She is having some insomnia.  Asked her to discuss this further with Dr. Maia Petties.  Past Medical History:  Diagnosis Date  . Acid reflux   . Back pain   . HLD (hyperlipidemia)   . Hypertension   . Insomnia   . Migraine   . Osteoarthritis    knee  . Psychosis ([place])    followed by Dr. Keenan Bachelor [phone]  . Thyroid nodule 2010   TSH 04/2008 = 0.296, no follow up noted in [facility]  . Vertigo     Past Surgical History:  Procedure Laterality Date  . TONSILLECTOMY    . UTERINE FIBROID SURGERY      Current Medications: Current Meds  Medication Sig  . ALPRAZolam (XANAX) 0.25 MG tablet Take 0.25 mg by mouth at bedtime as needed for anxiety.  Marland Kitchen amLODipine (NORVASC) 5 MG tablet TAKE 1 TABLET BY MOUTH EVERY DAY  . DULoxetine (CYMBALTA) 60 MG capsule Take by mouth daily.  . hydrOXYzine (ATARAX/VISTARIL) 10 MG tablet Take 10 mg by mouth.   . levothyroxine (SYNTHROID, LEVOTHROID) 25 MCG tablet Take  25 mcg by mouth daily before breakfast.  . losartan (COZAAR) 50 MG tablet Take 1 tablet (50 mg total) by mouth daily.  . meclizine (ANTIVERT) 25 MG tablet Take 25 mg by mouth 3 (three) times daily as needed for dizziness or nausea.  . meloxicam (MOBIC) 15 MG tablet Take 30 mg by mouth daily.   . metoprolol succinate (TOPROL-XL) 25 MG 24 hr tablet Take 1 tablet (25 mg total) by mouth daily.  Marland Kitchen omeprazole (PRILOSEC) 40 MG capsule Take 40 mg by mouth daily.  . rosuvastatin (CRESTOR) 10 MG tablet rosuvastatin 10 mg tablet  TAKE 1 TABLET BY MOUTH EVERYDAY AT BEDTIME     Allergies:   Ibuprofen, Lidocaine, and Olanzapine   Social History   Socioeconomic History  . Marital status: Divorced    Spouse name: Not on file  . Number of children: Not on file  . Years of education: Not on file  . Highest education level: Not on file  Occupational History  . Occupation: retired  Tobacco Use  . Smoking status: Never Smoker  . Smokeless tobacco: Never Used  Vaping Use  . Vaping Use: Never used  Substance and Sexual Activity  . Alcohol use: No    Alcohol/week: 0.0 standard drinks  . Drug use: No  . Sexual activity:  Yes  Other Topics Concern  . Not on file  Social History Narrative  . Not on file   Social Determinants of Health   Financial Resource Strain: Not on file  Food Insecurity: Not on file  Transportation Needs: Not on file  Physical Activity: Not on file  Stress: Not on file  Social Connections: Not on file     Family History: The patient's family history includes Heart disease in her father, mother, and another family member. There is no history of Stroke, Cancer, or Colon cancer.  ROS:   Please see the history of present illness.     All other systems reviewed and are negative.  EKGs/Labs/Other Studies Reviewed:    The following studies were reviewed today:  ECHO 2021:  1. Low normal to mildly reduced LV systolic function (EF 50); mild LVH;  grade 1 diastolic  dysfunction.  2. Left ventricular ejection fraction, by estimation, is 50 to 55%. The  left ventricle has low normal function. The left ventricle has no regional  wall motion abnormalities. There is mild left ventricular hypertrophy of  the basal and septal segments.  Left ventricular diastolic parameters are consistent with Grade I  diastolic dysfunction (impaired relaxation).  3. Right ventricular systolic function is normal. The right ventricular  size is normal. There is normal pulmonary artery systolic pressure.  4. The mitral valve is normal in structure. Trivial mitral valve  regurgitation. No evidence of mitral stenosis.  5. The aortic valve is tricuspid. Aortic valve regurgitation is not  visualized. Mild aortic valve sclerosis is present, with no evidence of  aortic valve stenosis.  6. The inferior vena cava is normal in size with greater than 50%  respiratory variability, suggesting right atrial pressure of 3 mmHg.   NUC stress 2016:   Nuclear stress EF: 63%. Normal LV function   The study is normal. No evidence of ischemia   This is a low risk study.   EKG:  EKG is  ordered today.  The ekg ordered today demonstrates sinus rhythm 78 with nonspecific ST-T wave changes.  Recent Labs: No results found for requested labs within last 8760 hours.  Recent Lipid Panel    Component Value Date/Time   CHOL 196 05/17/2014 1527   TRIG 168 (H) 05/17/2014 1527   HDL 68 05/17/2014 1527   CHOLHDL 2.9 05/17/2014 1527   VLDL 34 05/17/2014 1527   LDLCALC 94 05/17/2014 1527     Risk Assessment/Calculations:      Physical Exam:    VS:  BP 130/80 (BP Location: Left Arm, Patient Position: Sitting, Cuff Size: Normal)   Pulse 78   Ht 5\' 4"  (1.626 m)   Wt 168 lb (76.2 kg)   LMP 03/27/1984   SpO2 98%   BMI 28.84 kg/m     Wt Readings from Last 3 Encounters:  04/29/20 168 lb (76.2 kg)  10/25/19 168 lb (76.2 kg)  06/15/19 163 lb (73.9 kg)     GEN:  Well nourished,  well developed in no acute distress HEENT: Normal NECK: No JVD; No carotid bruits LYMPHATICS: No lymphadenopathy CARDIAC: RRR, no murmurs, rubs, gallops RESPIRATORY:  Clear to auscultation without rales, wheezing or rhonchi  ABDOMEN: Soft, non-tender, non-distended MUSCULOSKELETAL:  No edema; No deformity  SKIN: Warm and dry NEUROLOGIC:  Alert and oriented x 3 PSYCHIATRIC:  Normal affect   ASSESSMENT:    1. Tachycardia   2. Palpitations   3. Pure hypercholesterolemia   4. Coronary artery calcification  PLAN:    In order of problems listed above:  Tachycardia -Has been a longstanding issue, continuing with Toprol.  She says that this has helped out significantly.  She was worried about this medication potential side effects with hair loss.  Biotin has been suggested in the past.  Dyspnea -Overall longstanding as well.  EF has been 50% on echocardiogram.  Prior nuclear stress test in 2016 showed normal EF with no ischemia.  Essential hypertension -Medications reviewed as above no side effects.  Doing well with these.  Coronary artery calcification -LAD RCA on prior CT scan of the chest.  She is currently on Crestor.  Goal LDL less than 70. -Lipids are being checked by Dr. Maia Petties.    Medication Adjustments/Labs and Tests Ordered: Current medicines are reviewed at length with the patient today.  Concerns regarding medicines are outlined above.  Orders Placed This Encounter  Procedures  . EKG 12-Lead   No orders of the defined types were placed in this encounter.   Patient Instructions  Medication Instructions:  The current medical regimen is effective;  continue present plan and medications.  *If you need a refill on your cardiac medications before your next appointment, please call your pharmacy*  Follow-Up: At [facility], you and your health needs are our priority.  As part of our continuing mission to provide you with exceptional heart care, we have created  designated Provider Care Teams.  These Care Teams include your primary Cardiologist (physician) and Advanced Practice Providers (APPs -  Physician Assistants and Nurse Practitioners) who all work together to provide you with the care you need, when you need it.  We recommend signing up for the patient portal called "MyChart".  Sign up information is provided on this After Visit Summary.  MyChart is used to connect with patients for Virtual Visits (Telemedicine).  Patients are able to view lab/test results, encounter notes, upcoming appointments, etc.  Non-urgent messages can be sent to your provider as well.   To learn more about what you can do with MyChart, go to [email or web address].    Your next appointment:   12 month(s)  The format for your next appointment:   In Person  Provider:   Candee Furbish, MD   Thank you for choosing [facility]!!        Signed, Candee Furbish, MD  04/29/2020 10:51 AM    [place]

## 2020-07-09 ENCOUNTER — Encounter: Payer: Self-pay | Admitting: Psychology

## 2020-12-09 ENCOUNTER — Other Ambulatory Visit: Payer: Self-pay

## 2020-12-09 MED ORDER — LOSARTAN POTASSIUM 50 MG PO TABS: 50.0000 mg | ORAL_TABLET | Freq: Every day | ORAL | 1 refills | Status: DC

## 2020-12-09 MED ORDER — METOPROLOL SUCCINATE ER 25 MG PO TB24: 25.0000 mg | ORAL_TABLET | Freq: Every day | ORAL | 1 refills | Status: DC

## 2020-12-11 ENCOUNTER — Other Ambulatory Visit: Payer: Self-pay

## 2020-12-11 ENCOUNTER — Encounter: Payer: Medicare Other | Attending: Psychology | Admitting: Psychology

## 2020-12-11 ENCOUNTER — Encounter: Payer: Self-pay | Admitting: Psychology

## 2020-12-11 DIAGNOSIS — R4189 Other symptoms and signs involving cognitive functions and awareness: Secondary | ICD-10-CM | POA: Diagnosis present

## 2020-12-11 DIAGNOSIS — R269 Unspecified abnormalities of gait and mobility: Secondary | ICD-10-CM | POA: Diagnosis present

## 2020-12-11 DIAGNOSIS — R413 Other amnesia: Secondary | ICD-10-CM | POA: Insufficient documentation
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
## 2020-12-12 ENCOUNTER — Telehealth: Payer: Self-pay

## 2020-12-12 NOTE — Telephone Encounter (Signed)
Patient called and stated she thinks she forgot to mention that she has balance issues to the doctor yesterday. She wants you to be aware

## 2020-12-18 ENCOUNTER — Other Ambulatory Visit: Payer: Self-pay

## 2020-12-18 ENCOUNTER — Encounter: Payer: Medicare Other | Attending: Psychology

## 2020-12-18 DIAGNOSIS — R4189 Other symptoms and signs involving cognitive functions and awareness: Secondary | ICD-10-CM | POA: Insufficient documentation

## 2020-12-18 NOTE — Progress Notes (Signed)
Behavioral Observation The patient appeared well-groomed and appropriately dressed. Her manners were polite and appropriate to the situation. The patient was suspicious of the nature of the test ("This is biased against Black people", "I know how you White people are"). The patient was highly self-critical of her performance.   Neuropsychology Note  Doris [name] completed 120 minutes of neuropsychological testing with technician, Doris [name], Doris [name], under the supervision of Doris Skill, PsyD., Clinical Neuropsychologist. The patient did not appear overtly distressed by the testing session, per behavioral observation or via self-report to the technician. Rest breaks were offered.   Clinical Decision Making: In considering the patient's current level of functioning, level of presumed impairment, nature of symptoms, emotional and behavioral responses during clinical interview, level of literacy, and observed level of motivation/effort, a battery of tests was selected by Dr. Sima [name] during initial consultation on 12/11/2020. This was communicated to the technician. Communication between the neuropsychologist and technician was ongoing throughout the testing session and changes were made as deemed necessary based on patient performance on testing, technician observations and additional pertinent factors such as those listed above.  Tests Administered: Controlled Oral Word Association Test (COWAT; FAS & Animals)  Wechsler Adult Intelligence Scale, 4th Edition (WAIS-IV) Wechsler Memory Scale, 4th Edition (WMS-IV); Older Adult Battery   Results:   WAIS-IV  Composite Score Summary  Scale Sum of Scaled Scores Composite Score Percentile Rank 95% Conf. Interval Qualitative Description  Verbal Comprehension 25 VCI 91 27 86-97 Average  Perceptual Reasoning 23 PRI 86 18 80-93 Low Average  Working Memory 9 WMI 69 2 64-78 Extremely Low  Processing Speed 21 PSI 102 55 93-110 Average  Full Scale  78 FSIQ 84 14 80-88 Low Average  General Ability 48 GAI 87 19 82-92 Low Average      Verbal Comprehension Subtests Summary  Subtest Raw Score Scaled Score Percentile Rank Reference Group Scaled Score SEM  Similarities 17 8 25 6  1.12  Vocabulary 30 9 37 9 0.73  Information 9 8 25 8  0.73  (Comprehension) 17 8 25 7  1.08       Perceptual Reasoning Subtests Summary  Subtest Raw Score Scaled Score Percentile Rank Reference Group Scaled Score SEM  Block Design 24 9 37 6 1.27  Matrix Reasoning 6 7 16 3  0.73  Visual Puzzles 7 7 16 5  0.99  (Picture Completion) 5 7 16 4  1.12       Working Doctor, general practice Raw Score Scaled Score Percentile Rank Reference Group Scaled Score SEM  Digit Span 15 5 5 3  0.79  Arithmetic 6 4 2 4  0.95       Processing Speed Subtests Summary  Subtest Raw Score Scaled Score Percentile Rank Reference Group Scaled Score SEM  Symbol Search 22 10 50 6 1.12  Coding 47 11 63 6 1.12      WMS-IV  Index Score Summary  Index Sum of Scaled Scores Index Score Percentile Rank 95% Confidence Interval Qualitative Descriptor  Auditory Memory (AMI) 35 93 32 87-100 Average  Visual Memory (VMI) 19 98 45 93-103 Average  Immediate Memory (IMI) 24 87 19 81-94 Low Average  Delayed Memory (DMI) 30 100 50 92-108 Average      Primary Subtest Scaled Score Summary  Subtest Domain Raw Score Scaled Score Percentile Rank  Logical Memory I AM 15 5 5   Logical Memory II AM 12 9 37  Verbal Paired Associates I AM 17 9 37  Verbal Paired Associates II AM 7 12  75  Visual Reproduction I VM 27 10 50  Visual Reproduction II VM 14 9 37  Symbol Span VWM 10 8 25       Auditory Memory Process Score Summary  Process Score Raw Score Scaled Score Percentile Rank Cumulative Percentage (Base Rate)  LM II Recognition 18 - - 26-50%  VPA II Recognition 28 - - 51-75%       Visual Memory Process Score Summary  Process Score Raw Score Scaled Score Percentile Rank  Cumulative Percentage (Base Rate)  VR II Recognition 3 - - 26-50%      ABILITY-MEMORY ANALYSIS  Ability Score:  VCI: 91 Date of Testing:  WAIS-IV; WMS-IV 2020/12/18  Predicted Difference Method   Index Predicted WMS-IV Index Score Actual WMS-IV Index Score Difference Critical Value  Significant Difference Y/N Base Rate  Auditory Memory 95 93 2 9.39 N   Visual Memory 96 98 -2 8.28 N   Immediate Memory 95 87 8 10.49 N   Delayed Memory 95 100 -5 12.08 N   Statistical significance (critical value) at the .01 level.       COWAT FAS Total = 21 Z = -1.74 Animals Total = 11 Z = -1.71      Feedback to Patient: Doris [name] will return on 06/18/2021 or sooner for an interactive feedback session with Dr. Sima [name] at which time her test performances, clinical impressions and treatment recommendations will be reviewed in detail. The patient understands she can contact our office should she require our assistance before this time.  120 minutes spent face-to-face with patient administering standardized tests, 30 minutes spent scoring Environmental education officer). [CPT [id number], [id number]]  Full report to follow.

## 2021-01-21 ENCOUNTER — Other Ambulatory Visit: Payer: Self-pay

## 2021-01-21 ENCOUNTER — Encounter: Payer: Medicare Other | Attending: Psychology | Admitting: Psychology

## 2021-01-21 ENCOUNTER — Encounter: Payer: Self-pay | Admitting: Psychology

## 2021-01-21 DIAGNOSIS — E039 Hypothyroidism, unspecified: Secondary | ICD-10-CM | POA: Insufficient documentation

## 2021-01-21 DIAGNOSIS — R7303 Prediabetes: Secondary | ICD-10-CM | POA: Insufficient documentation

## 2021-01-21 DIAGNOSIS — E785 Hyperlipidemia, unspecified: Secondary | ICD-10-CM | POA: Insufficient documentation

## 2021-01-21 DIAGNOSIS — G3184 Mild cognitive impairment, so stated: Secondary | ICD-10-CM | POA: Insufficient documentation

## 2021-01-21 DIAGNOSIS — E559 Vitamin D deficiency, unspecified: Secondary | ICD-10-CM | POA: Insufficient documentation

## 2021-01-21 DIAGNOSIS — Z9181 History of falling: Secondary | ICD-10-CM | POA: Insufficient documentation

## 2021-01-21 DIAGNOSIS — M549 Dorsalgia, unspecified: Secondary | ICD-10-CM | POA: Diagnosis not present

## 2021-01-21 DIAGNOSIS — F329 Major depressive disorder, single episode, unspecified: Secondary | ICD-10-CM | POA: Diagnosis not present

## 2021-01-21 DIAGNOSIS — R269 Unspecified abnormalities of gait and mobility: Secondary | ICD-10-CM | POA: Diagnosis not present

## 2021-01-21 DIAGNOSIS — G894 Chronic pain syndrome: Secondary | ICD-10-CM | POA: Diagnosis not present

## 2021-01-21 DIAGNOSIS — M542 Cervicalgia: Secondary | ICD-10-CM | POA: Diagnosis not present

## 2021-01-21 DIAGNOSIS — I10 Essential (primary) hypertension: Secondary | ICD-10-CM | POA: Insufficient documentation

## 2021-01-21 DIAGNOSIS — R000 Tachycardia, unspecified: Secondary | ICD-10-CM | POA: Diagnosis not present

## 2021-01-21 DIAGNOSIS — H8110 Benign paroxysmal vertigo, unspecified ear: Secondary | ICD-10-CM | POA: Insufficient documentation

## 2021-01-21 DIAGNOSIS — R296 Repeated falls: Secondary | ICD-10-CM | POA: Insufficient documentation

## 2021-01-21 DIAGNOSIS — Z79899 Other long term (current) drug therapy: Secondary | ICD-10-CM | POA: Insufficient documentation
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
## 2021-02-11 ENCOUNTER — Other Ambulatory Visit: Payer: Self-pay

## 2021-02-11 ENCOUNTER — Encounter (HOSPITAL_COMMUNITY): Payer: Self-pay | Admitting: *Deleted

## 2021-02-11 ENCOUNTER — Ambulatory Visit (HOSPITAL_COMMUNITY)
Admission: EM | Admit: 2021-02-11 | Discharge: 2021-02-11 | Disposition: A | Discharge status: Home / Self Care | Payer: Medicare Other | Attending: Internal Medicine | Admitting: Internal Medicine

## 2021-02-11 ENCOUNTER — Ambulatory Visit (INDEPENDENT_AMBULATORY_CARE_PROVIDER_SITE_OTHER): Payer: Medicare Other

## 2021-02-11 DIAGNOSIS — M7551 Bursitis of right shoulder: Secondary | ICD-10-CM | POA: Diagnosis not present

## 2021-02-11 DIAGNOSIS — W19XXXA Unspecified fall, initial encounter: Secondary | ICD-10-CM | POA: Diagnosis not present

## 2021-02-11 DIAGNOSIS — M25511 Pain in right shoulder: Secondary | ICD-10-CM | POA: Diagnosis not present

## 2021-02-11 DIAGNOSIS — S40011A Contusion of right shoulder, initial encounter: Secondary | ICD-10-CM

## 2021-02-11 MED ORDER — LIDOCAINE 5 % EX PTCH: 1.0000 | MEDICATED_PATCH | CUTANEOUS | 0 refills | Status: DC

## 2021-02-11 NOTE — ED Provider Notes (Signed)
[facility]    CSN: [id number] Arrival date & time: 02/11/21  1449      History   Chief Complaint Chief Complaint  Patient presents with   Fall   Arm Pain    RT upper arm    HPI Doris [name] is a 75 y.o. female.   Pleasant 75 year old female presents today with acute onset of right shoulder pain.  She states she has R shoulder pain at baseline and is actually being followed by Ortho for suspected tendinitis of her R shoulder.  She has been prescribed 15 mg meloxicam daily for use.  She has also been using topical voltaren gel. She came in today due to a slight increase in pain from her baseline.  She states 4 or 5 days ago, she was attempting to use the bathroom, but states that the toilet seat was much lower than expected.  She missed the seat and fell forwards, hitting her right shoulder and upper humerus on the side of the sink.  She came in requesting an x-ray due to history of a fracture on her left shoulder previously and concerned that this might have occurred again.  She has been applying heat to her shoulder over the past several days.  She denies any bruising or swelling.  She denies any decreased range of motion but states it is uncomfortable with movements.  She states the primary discomfort is located over her [facility] joint.   Fall  Arm Pain  Past Medical History:  Diagnosis Date   Acid reflux    Back pain    HLD (hyperlipidemia)    Hypertension    Insomnia    Migraine    Osteoarthritis    knee   Psychosis ([place])    followed by Dr. Keenan Bachelor [phone]   Thyroid nodule 2010   TSH 04/2008 = 0.296, no follow up noted in [facility]   Vertigo     Patient Active Problem List   Diagnosis Date Noted   Impaired cognition 05/03/2018   Vitamin D deficiency 12/08/2016   Overweight 10/27/2016   Postablative hypothyroidism 10/27/2016   Prediabetes 10/27/2016   Muscle spasms of neck 08/26/2016   Abnormality of gait 08/10/2016   Osteoporosis 10/13/2015    Acrochordon 07/07/2015   Jaw pain 12/30/2014   Coronary artery calcification 12/23/2014   Tachycardia 08/27/2014   Cholelithiasis 05/17/2014   GERD (gastroesophageal reflux disease) 01/02/2013   TB lung, latent 05/01/2012   Osteoarthritis 03/01/2012   Severe major depression with psychotic features ([place]) 04/01/2011   Hypothyroidism (acquired) 03/22/2011   Healthcare maintenance 12/28/2010   BPPV (benign paroxysmal positional vertigo) 11/19/2008   Hyperlipidemia 11/30/2005   Essential hypertension 11/30/2005   LOW BACK PAIN 11/30/2005    Past Surgical History:  Procedure Laterality Date   TONSILLECTOMY     UTERINE FIBROID SURGERY      OB History     Gravida  4   Para      Term      Preterm      AB      Living  3      SAB      IAB      Ectopic      Multiple      Live Births  3            Home Medications    Prior to Admission medications   Medication Sig Start Date End Date Taking? Authorizing Provider  lidocaine (LIDODERM) 5 % Place 1 patch  onto the skin daily. Remove & Discard patch within 12 hours or as directed by MD 02/11/21  Yes Zacharias Ridling L, PA  ALPRAZolam (XANAX) 0.25 MG tablet Take 0.25 mg by mouth at bedtime as needed for anxiety.    [provider]  amLODipine (NORVASC) 5 MG tablet TAKE 1 TABLET BY MOUTH EVERY DAY 09/17/16   Velna Ochs, MD  DULoxetine (CYMBALTA) 60 MG capsule Take by mouth daily. 10/17/18   [provider]  hydrOXYzine (ATARAX/VISTARIL) 10 MG tablet Take 10 mg by mouth.  07/20/18   [provider]  levothyroxine (SYNTHROID, LEVOTHROID) 25 MCG tablet Take 25 mcg by mouth daily before breakfast.    [provider]  losartan (COZAAR) 50 MG tablet Take 1 tablet (50 mg total) by mouth daily. 12/09/20   Jerline Pain, MD  meclizine (ANTIVERT) 25 MG tablet Take 25 mg by mouth 3 (three) times daily as needed for dizziness or nausea.    [provider]  meloxicam (MOBIC) 15 MG tablet  Take 30 mg by mouth daily.     [provider]  metoprolol succinate (TOPROL-XL) 25 MG 24 hr tablet Take 1 tablet (25 mg total) by mouth daily. 12/09/20   Jerline Pain, MD  omeprazole (PRILOSEC) 40 MG capsule Take 40 mg by mouth daily.    [provider]  rosuvastatin (CRESTOR) 10 MG tablet rosuvastatin 10 mg tablet  TAKE 1 TABLET BY MOUTH EVERYDAY AT BEDTIME    [provider]    Family History Family History  Problem Relation Age of Onset   Heart disease Mother    Heart disease Father    Heart disease Other    Stroke Neg Hx    Cancer Neg Hx    Colon cancer Neg Hx     Social History Social History   Tobacco Use   Smoking status: Never   Smokeless tobacco: Never  Vaping Use   Vaping Use: Never used  Substance Use Topics   Alcohol use: No    Alcohol/week: 0.0 standard drinks   Drug use: No     Allergies   Ibuprofen, Lidocaine, and Olanzapine   Review of Systems Review of Systems As per hpi  Physical Exam Triage Vital Signs ED Triage Vitals  Enc Vitals Group     BP 02/11/21 1552 (!) 158/89     Pulse Rate 02/11/21 1552 81     Resp 02/11/21 1552 20     Temp 02/11/21 1552 98.3 F (36.8 C)     Temp src --      SpO2 02/11/21 1552 98 %     Weight --      Height --      Head Circumference --      Peak Flow --      Pain Score 02/11/21 1550 5     Pain Loc --      Pain Edu? --      Excl. in [place]? --    No data found.  Updated Vital Signs BP (!) 158/89    Pulse 81    Temp 98.3 F (36.8 C)    Resp 20    LMP 03/27/1984    SpO2 98%   Visual Acuity Right Eye Distance:   Left Eye Distance:   Bilateral Distance:    Right Eye Near:   Left Eye Near:    Bilateral Near:     Physical Exam Vitals and nursing note reviewed.  Constitutional:  Appearance: Normal appearance.  HENT:     Head: Normocephalic and atraumatic.  Cardiovascular:     Rate and Rhythm: Normal rate.  Pulmonary:     Effort: Pulmonary effort is normal.   Musculoskeletal:     Right shoulder: Tenderness (to subdeltoid bursa) and bony tenderness (to [facility] joint) present. No swelling, deformity, effusion, laceration or crepitus. Normal range of motion. Normal strength. Normal pulse.     Left shoulder: Normal. No swelling, deformity, effusion, laceration, tenderness, bony tenderness or crepitus. Normal range of motion. Normal strength. Normal pulse.     Right upper arm: Normal. No swelling, edema, deformity, lacerations, tenderness or bony tenderness.     Left upper arm: Normal. No swelling, edema, deformity, lacerations, tenderness or bony tenderness.     Right elbow: Normal.     Left elbow: Normal.     Right hand: Normal.     Left hand: Normal.     Right lower leg: No edema.     Left lower leg: No edema.  Skin:    General: Skin is warm.     Findings: No bruising, erythema or rash.  Neurological:     General: No focal deficit present.     Mental Status: She is alert and oriented to person, place, and time.  Psychiatric:        Mood and Affect: Mood normal.     UC Treatments / Results  Labs (all labs ordered are listed, but only abnormal results are displayed) Labs Reviewed - No data to display  EKG   Radiology DG Shoulder Right  Result Date: 02/11/2021 CLINICAL DATA:  Fall.  Right shoulder pain. EXAM: RIGHT SHOULDER - 2+ VIEW COMPARISON:  None. FINDINGS: There is no evidence of fracture or dislocation. Moderate degenerative changes identified at the [facility] joint and there is mild degenerative changes at the glenohumeral joint. Soft tissues are unremarkable. IMPRESSION: 1. No acute findings. 2. Osteoarthritis. Electronically Signed   By: Kerby Moors M.D.   On: 02/11/2021 16:16    Procedures Procedures (including critical care time)  Medications Ordered in UC Medications - No data to display  Initial Impression / Assessment and Plan / UC Course  I have reviewed the triage vital signs and the nursing notes.  Pertinent labs &  imaging results that were available during my care of the patient were reviewed by me and considered in my medical decision making (see chart for details).     R shoulder contusion - xray revealing AC arthritis. Pt already taking max dose NSAID and topical voltaren gel. Discussed possible lidocaine patches. Lidocaine listed as allergy - pt states she had a reaction years ago when she ingested large amounts of it when mixed with mylanta for a GI issue. She denies any allergy to topical lidocaine. May also use tylenol 500mg  tabs, 2 TID PRN. Ice Bursitis - tx as above. Follow up with ortho for further recommendations.  Final Clinical Impressions(s) / UC Diagnoses   Final diagnoses:  Contusion of right shoulder, initial encounter  Bursitis of right shoulder     Discharge Instructions      Apply the patch to your R shoulder in the morning. Wear for 12 hours, then remove for 12 hours. If the Rx patch is not covered by insurance, you can purchase a 4% lidocaine patch over the counter, by the name Salonpas. Your xray does not show any fractures.  Continue taking your mobic/meloxicam. Follow up with orthopedist if symptoms persist.  ED Prescriptions     Medication Sig Dispense Auth. Provider   lidocaine (LIDODERM) 5 % Place 1 patch onto the skin daily. Remove & Discard patch within 12 hours or as directed by MD 15 patch Duvan Mousel L, PA      PDMP not reviewed this encounter.   Chaney Malling, [place] 02/11/21 1934

## 2021-02-11 NOTE — ED Triage Notes (Signed)
Pt reports several days she was trying to sit on toilet and the seat was lower than expected . Pt reports falling  into sink and hitting Rt upper arm .

## 2021-02-11 NOTE — Discharge Instructions (Signed)
Apply the patch to your R shoulder in the morning. Wear for 12 hours, then remove for 12 hours. If the Rx patch is not covered by insurance, you can purchase a 4% lidocaine patch over the counter, by the name Salonpas. Your xray does not show any fractures.  Continue taking your mobic/meloxicam. Follow up with orthopedist if symptoms persist.

## 2021-02-27 ENCOUNTER — Ambulatory Visit: Payer: Medicare Other | Attending: Internal Medicine

## 2021-02-27 ENCOUNTER — Other Ambulatory Visit: Payer: Self-pay

## 2021-02-27 DIAGNOSIS — R2689 Other abnormalities of gait and mobility: Secondary | ICD-10-CM | POA: Insufficient documentation

## 2021-02-27 DIAGNOSIS — R42 Dizziness and giddiness: Secondary | ICD-10-CM | POA: Diagnosis present

## 2021-02-27 DIAGNOSIS — R2681 Unsteadiness on feet: Secondary | ICD-10-CM | POA: Diagnosis present

## 2021-02-27 NOTE — Therapy (Signed)
[facility] [address] [place], [place], [address] Phone: [phone]   Fax:  [phone]  Physical Therapy Evaluation  Patient Details  Name: Doris [name] MRN: [id number] Date of Birth: 13-Dec-1945 Referring Provider (PT): Dr. Maia Petties   Encounter Date: 02/27/2021   PT End of Session - 02/27/21 1210     Visit Number 1    Number of Visits 10    Date for PT Re-Evaluation 04/10/21    PT Start Time 1115    PT Stop Time 1145    PT Time Calculation (min) 30 min    Equipment Utilized During Treatment Gait belt    Activity Tolerance Patient tolerated treatment well    Behavior During Therapy [facility] for tasks assessed/performed             Past Medical History:  Diagnosis Date   Acid reflux    Back pain    HLD (hyperlipidemia)    Hypertension    Insomnia    Migraine    Osteoarthritis    knee   Psychosis ([place])    followed by Dr. Keenan Bachelor [phone]   Thyroid nodule 2010   TSH 04/2008 = 0.296, no follow up noted in [facility]   Vertigo     Past Surgical History:  Procedure Laterality Date   TONSILLECTOMY     UTERINE FIBROID SURGERY      There were no vitals filed for this visit.    Subjective Assessment - 02/27/21 1121     Subjective Pt reports that she has bil lower back pain with sciatica. Pt got shot in her back before Thanksgiving but had allergic reaction with rash. She has had shots in her knees previsouly without any reaction. Patient has bil DJD in bil knees. Pt reports that she uses cane for mobility at home. Pt reports that she fell on X-mas day while she was trying sit on toilet at her daughter's house and it was too low and she fell with shoulder hit the coutertop.    Patient is accompained by: Family member    Currently in Pain? Yes    Pain Score 3     Pain Location Knee    Pain Orientation Left;Right    Pain Descriptors / Indicators Aching    Pain Type Chronic pain    Pain Onset More than  a month ago    Pain Frequency Intermittent                [facility] PT Assessment - 02/27/21 1124       Assessment   Medical Diagnosis Falls/LBP/gait issues    Referring Provider (PT) Dr. Maia Petties    Onset Date/Surgical Date 02/08/21      Precautions   Precautions Fall      Restrictions   Weight Bearing Restrictions No      Balance Screen   Has the patient fallen in the past 6 months Yes    How many times? 1    Has the patient had a decrease in activity level because of a fear of falling?  No    Is the patient reluctant to leave their home because of a fear of falling?  No      Home Environment   Living Environment Private residence    Living Arrangements Alone    Available Help at Discharge Family    Type of [place] Access Level entry    [place] One level  Prior Function   Level of Independence Independent      Cognition   Overall Cognitive Status Within Functional Limits for tasks assessed      ROM / Strength   AROM / PROM / Strength AROM      AROM   AROM Assessment Site Lumbar    Lumbar Flexion 100% able to touch toes    Lumbar Extension 70%      Standardized Balance Assessment   Standardized Balance Assessment Five Times Sit to Stand    Five times sit to stand comments  20 sec without HHA      Functional Gait  Assessment   Gait assessed  Yes    Gait Level Surface Walks 20 ft in less than 7 sec but greater than 5.5 sec, uses assistive device, slower speed, mild gait deviations, or deviates 6-10 in outside of the 12 in walkway width.    Change in Gait Speed Able to smoothly change walking speed without loss of balance or gait deviation. Deviate no more than 6 in outside of the 12 in walkway width.    Gait with Horizontal Head Turns Performs head turns smoothly with no change in gait. Deviates no more than 6 in outside 12 in walkway width    Gait with Vertical Head Turns Performs task with moderate change in gait velocity, slows down,  deviates 10-15 in outside 12 in walkway width but recovers, can continue to walk.    Gait and Pivot Turn Pivot turns safely within 3 sec and stops quickly with no loss of balance.    Step Over Obstacle Is able to step over 2 stacked shoe boxes taped together (9 in total height) without changing gait speed. No evidence of imbalance.    Gait with Narrow Base of Support Ambulates less than 4 steps heel to toe or cannot perform without assistance.    Gait with Eyes Closed Cannot walk 20 ft without assistance, severe gait deviations or imbalance, deviates greater than 15 in outside 12 in walkway width or will not attempt task.    Ambulating Backwards Walks 20 ft, uses assistive device, slower speed, mild gait deviations, deviates 6-10 in outside 12 in walkway width.    Steps Alternating feet, must use rail.    Total Score 19    FGA comment: 19/30                        Objective measurements completed on examination: See above findings.                  PT Short Term Goals - 02/27/21 1203       PT SHORT TERM GOAL #1   Title Pt will be able to ambulate 20 feet with eyes closed without AD to improve balance in low light situation    Baseline 5 feet (02/27/21)    Time 3    Period Weeks    Status New    Target Date 03/20/21      PT SHORT TERM GOAL #2   Title Pt will be able to perform 5x sit to stand without HHA <18 seconds to improve functional strength    Baseline 20 sec no HHA (02/27/21)    Time 3    Period Weeks    Status New    Target Date 03/20/21               PT Long Term Goals - 02/27/21 1205  PT LONG TERM GOAL #1   Title Pt will demo >24/30 on FGA to improve functional gait and mobility    Baseline 19/30 no AD (02/27/21)    Time 6    Period Weeks    Status New    Target Date 04/10/21      PT LONG TERM GOAL #2   Title Pt will be able to perform 5x sit to stand without HHA in <16 seconds to improve functional strength    Baseline 20  sec (02/27/21)    Time 6    Period Weeks    Status New    Target Date 04/10/21                    Plan - 02/27/21 1206     Clinical Impression Statement Pt is a 76 y.o. female who was seen today for gait and mobility disorder and frequent falls. Pt has PMH of DJD in bil knees, lumbar spine, shoulders and neck. Patient demonstrates decreased functional strength in LE and core (5x sit to stand) and decreased functional gait and mobility (functional gait assessment). Patient will benefit from skilled PT to address above impairments and improve function and decrease fall risk.    Personal Factors and Comorbidities Age;Comorbidity 2    Comorbidities DJD back, bil knees    Examination-Activity Limitations Bend;Lift;Squat;Stairs;Stand;Toileting;Transfers    Examination-Participation Restrictions Cleaning;Community Activity;Laundry;Meal Prep;Shop    Stability/Clinical Decision Making Stable/Uncomplicated    Clinical Decision Making Low    Rehab Potential Good    PT Frequency Other (comment)   1-2x/week   PT Duration 6 weeks    PT Treatment/Interventions ADLs/Self Care Home Management;Cryotherapy;Moist Heat;Gait training;Stair training;Functional mobility training;Therapeutic activities;Therapeutic exercise;Balance training;Manual techniques;Patient/family education;Neuromuscular re-education;Orthotic Fit/Training;Passive range of motion;Energy conservation;Joint Manipulations;Spinal Manipulations    PT Next Visit Plan Issue HEP (standing balance with EC, head turns to help with motion sensitivity), issue HEP for functional strengthening, work on sit to stand and FGA components    Consulted and Agree with Plan of Care Patient             Patient will benefit from skilled therapeutic intervention in order to improve the following deficits and impairments:  Abnormal gait, Decreased balance, Decreased range of motion, Decreased mobility, Difficulty walking, Decreased strength, Impaired  flexibility, Increased fascial restricitons, Postural dysfunction, Pain  Visit Diagnosis: Other abnormalities of gait and mobility     Problem List Patient Active Problem List   Diagnosis Date Noted   Impaired cognition 05/03/2018   Vitamin D deficiency 12/08/2016   Overweight 10/27/2016   Postablative hypothyroidism 10/27/2016   Prediabetes 10/27/2016   Muscle spasms of neck 08/26/2016   Abnormality of gait 08/10/2016   Osteoporosis 10/13/2015   Acrochordon 07/07/2015   Jaw pain 12/30/2014   Coronary artery calcification 12/23/2014   Tachycardia 08/27/2014   Cholelithiasis 05/17/2014   GERD (gastroesophageal reflux disease) 01/02/2013   TB lung, latent 05/01/2012   Osteoarthritis 03/01/2012   Severe major depression with psychotic features ([place]) 04/01/2011   Hypothyroidism (acquired) 03/22/2011   Healthcare maintenance 12/28/2010   BPPV (benign paroxysmal positional vertigo) 11/19/2008   Hyperlipidemia 11/30/2005   Essential hypertension 11/30/2005   LOW BACK PAIN 11/30/2005    Kerrie Pleasure, PT 02/27/2021, 12:11 PM  [facility] [address] [place], [place], [address] Phone: [phone]   Fax:  [phone]  Name: Doris [name] MRN: [id number] Date of Birth: [birth year]

## 2021-03-02 ENCOUNTER — Other Ambulatory Visit: Payer: Self-pay | Admitting: Cardiology

## 2021-03-04 ENCOUNTER — Ambulatory Visit: Payer: Medicare Other | Admitting: Physical Therapy

## 2021-03-04 ENCOUNTER — Encounter: Payer: Self-pay | Admitting: Physical Therapy

## 2021-03-04 ENCOUNTER — Other Ambulatory Visit: Payer: Self-pay

## 2021-03-04 DIAGNOSIS — R2689 Other abnormalities of gait and mobility: Secondary | ICD-10-CM | POA: Diagnosis not present

## 2021-03-04 NOTE — Patient Instructions (Signed)
Access Code: [email or web address] URL: [email or web address] Date: 03/04/2021 Prepared by: Willow Ora  Exercises Backward Walking with Counter Support - 1 x daily - 5 x weekly - 1 sets - 2 reps Walking [place] - 1 x daily - 5 x weekly - 1 sets - 2 reps Standing in corner with eyes closed - 1 x daily - 5 x weekly - 1 sets - 3 reps - 30 seconds hold Standing Near Stance in [place] with Eyes Closed - 1 x daily - 5 x weekly - 1 sets - 10 reps Tandem Stance in Corner - 1 x daily - 5 x weekly - 1 sets - 2-3 reps - 30 seconds hold

## 2021-03-04 NOTE — Therapy (Signed)
[facility] [address] [place], [place], [address] Phone: [phone]   Fax:  [phone]  Physical Therapy Treatment  Patient Details  Name: Doris [name] MRN: [id number] Date of Birth: Jan 06, 1946 Referring Provider (PT): Dr. Maia Petties   Encounter Date: 03/04/2021   PT End of Session - 03/04/21 1456     Visit Number 2    Number of Visits 10    Date for PT Re-Evaluation 04/10/21    Progress Note Due on Visit 10    PT Start Time 1449    PT Stop Time 1528    PT Time Calculation (min) 39 min    Equipment Utilized During Treatment Gait belt    Activity Tolerance Patient tolerated treatment well    Behavior During Therapy [facility] for tasks assessed/performed             Past Medical History:  Diagnosis Date   Acid reflux    Back pain    HLD (hyperlipidemia)    Hypertension    Insomnia    Migraine    Osteoarthritis    knee   Psychosis ([place])    followed by Dr. Keenan Bachelor [phone]   Thyroid nodule 2010   TSH 04/2008 = 0.296, no follow up noted in [facility]   Vertigo     Past Surgical History:  Procedure Laterality Date   TONSILLECTOMY     UTERINE FIBROID SURGERY      There were no vitals filed for this visit.   Subjective Assessment - 03/04/21 1452     Subjective No new complaints. No falls, some stumbling. Did take laundry to the laundry mat, "that was not a good idea". Was very tired afterwards with no feeling in her right arm afterwards. Since her last session her right knee gave way, caught herself. Reports this has happened before.    Patient is accompained by: Family member                     [place] Adult PT Treatment/Exercise - 03/04/21 1457       Transfers   Transfers Sit to Stand;Stand to Sit    Sit to Stand 5: Supervision    Stand to Sit 5: Supervision      Ambulation/Gait   Ambulation/Gait Yes    Ambulation/Gait Assistance 5: Supervision    Ambulation Distance (Feet) --    around clinic with session   Assistive device None    Gait Pattern Step-through pattern;Decreased stride length    Ambulation Surface Level;Indoor      Neuro Re-ed    Neuro Re-ed Details  issued ex's for strengthening and balance. Refer to [place] for full details. Min guard assist for safety with cues on form and technique.             Issued to HEP this session: Access Code: [email or web address] URL: [email or web address] Date: 03/04/2021 Prepared by: Willow Ora  Exercises Backward Walking with Counter Support - 1 x daily - 5 x weekly - 1 sets - 2 reps Walking March - 1 x daily - 5 x weekly - 1 sets - 2 reps Standing in corner with eyes closed - 1 x daily - 5 x weekly - 1 sets - 3 reps - 30 seconds hold Standing Near Stance in [place] with Eyes Closed - 1 x daily - 5 x weekly - 1 sets - 10 reps Tandem Stance in Corner - 1 x daily - 5 x weekly - 1 sets - 2-3  reps - 30 seconds hold        PT Education - 03/04/21 1523     Education Details HEP for balance    Person(s) Educated Patient    Methods Explanation;Demonstration;Verbal cues;Handout    Comprehension Verbalized understanding;Returned demonstration;Verbal cues required;Need further instruction              PT Short Term Goals - 02/27/21 1203       PT SHORT TERM GOAL #1   Title Pt will be able to ambulate 20 feet with eyes closed without AD to improve balance in low light situation    Baseline 5 feet (02/27/21)    Time 3    Period Weeks    Status New    Target Date 03/20/21      PT SHORT TERM GOAL #2   Title Pt will be able to perform 5x sit to stand without HHA <18 seconds to improve functional strength    Baseline 20 sec no HHA (02/27/21)    Time 3    Period Weeks    Status New    Target Date 03/20/21               PT Long Term Goals - 02/27/21 1205       PT LONG TERM GOAL #1   Title Pt will demo >24/30 on FGA to improve functional gait and mobility    Baseline 19/30 no AD (02/27/21)     Time 6    Period Weeks    Status New    Target Date 04/10/21      PT LONG TERM GOAL #2   Title Pt will be able to perform 5x sit to stand without HHA in <16 seconds to improve functional strength    Baseline 20 sec (02/27/21)    Time 6    Period Weeks    Status New    Target Date 04/10/21                   Plan - 03/04/21 1456     Clinical Impression Statement Today's skiled session focused on establishment of an HEP to address strengthening and balance. Pt reports not doing any of the ones she was given a couple years ago when she was here for Vertigo except for self Eply in bed when she feels the symptoms of vertigo. No issues noted or reported with ex's issued today. The pt is making steady progress and should benefit from continued PT to progress toward unmet goals.    Personal Factors and Comorbidities Age;Comorbidity 2    Comorbidities DJD back, bil knees    Examination-Activity Limitations Bend;Lift;Squat;Stairs;Stand;Toileting;Transfers    Examination-Participation Restrictions Cleaning;Community Activity;Laundry;Meal Prep;Shop    Stability/Clinical Decision Making Stable/Uncomplicated    Rehab Potential Good    PT Frequency Other (comment)   1-2x/week   PT Duration 6 weeks    PT Treatment/Interventions ADLs/Self Care Home Management;Cryotherapy;Moist Heat;Gait training;Stair training;Functional mobility training;Therapeutic activities;Therapeutic exercise;Balance training;Manual techniques;Patient/family education;Neuromuscular re-education;Orthotic Fit/Training;Passive range of motion;Energy conservation;Joint Manipulations;Spinal Manipulations    PT Next Visit Plan continue to work on dynamic gait, balance on compliant surfaces with and without vision removed and stepping stratagies as pt reports stubbling at times.    PT Home Exercise Plan Access Code: [email or web address]    Consulted and Agree with Plan of Care Patient             Patient will benefit from skilled  therapeutic intervention in order to improve the following deficits and impairments:  Abnormal gait, Decreased balance, Decreased range of motion, Decreased mobility, Difficulty walking, Decreased strength, Impaired flexibility, Increased fascial restricitons, Postural dysfunction, Pain  Visit Diagnosis: Other abnormalities of gait and mobility     Problem List Patient Active Problem List   Diagnosis Date Noted   Impaired cognition 05/03/2018   Vitamin D deficiency 12/08/2016   Overweight 10/27/2016   Postablative hypothyroidism 10/27/2016   Prediabetes 10/27/2016   Muscle spasms of neck 08/26/2016   Abnormality of gait 08/10/2016   Osteoporosis 10/13/2015   Acrochordon 07/07/2015   Jaw pain 12/30/2014   Coronary artery calcification 12/23/2014   Tachycardia 08/27/2014   Cholelithiasis 05/17/2014   GERD (gastroesophageal reflux disease) 01/02/2013   TB lung, latent 05/01/2012   Osteoarthritis 03/01/2012   Severe major depression with psychotic features ([place]) 04/01/2011   Hypothyroidism (acquired) 03/22/2011   Healthcare maintenance 12/28/2010   BPPV (benign paroxysmal positional vertigo) 11/19/2008   Hyperlipidemia 11/30/2005   Essential hypertension 11/30/2005   LOW BACK PAIN 11/30/2005    Willow Ora, PTA, [facility] [address] [place], [place] [address] [phone] 03/04/21, 4:40 PM   Name: Doris [name] MRN: [id number] Date of Birth: 10/04/1945

## 2021-03-06 ENCOUNTER — Ambulatory Visit: Payer: Medicare Other

## 2021-03-06 ENCOUNTER — Other Ambulatory Visit: Payer: Self-pay

## 2021-03-06 DIAGNOSIS — R2689 Other abnormalities of gait and mobility: Secondary | ICD-10-CM | POA: Diagnosis not present

## 2021-03-06 NOTE — Therapy (Signed)
[facility] [address] [place], [place], [address] Phone: [phone]   Fax:  [phone]  Physical Therapy Treatment  Patient Details  Name: Doris [name] MRN: [id number] Date of Birth: 01/01/1946 Referring Provider (PT): Dr. Maia Petties   Encounter Date: 03/06/2021   PT End of Session - 03/06/21 1503     Visit Number 3    Number of Visits 10    Date for PT Re-Evaluation 04/10/21    Progress Note Due on Visit 10    PT Start Time 1450    PT Stop Time 1530    PT Time Calculation (min) 40 min    Equipment Utilized During Treatment Gait belt    Activity Tolerance Patient tolerated treatment well    Behavior During Therapy [facility] for tasks assessed/performed             Past Medical History:  Diagnosis Date   Acid reflux    Back pain    HLD (hyperlipidemia)    Hypertension    Insomnia    Migraine    Osteoarthritis    knee   Psychosis ([place])    followed by Dr. Keenan Bachelor [phone]   Thyroid nodule 2010   TSH 04/2008 = 0.296, no follow up noted in [facility]   Vertigo     Past Surgical History:  Procedure Laterality Date   TONSILLECTOMY     UTERINE FIBROID SURGERY      There were no vitals filed for this visit.   Subjective Assessment - 03/06/21 1503     Subjective Pt reports knee hasn't given out since last session. She still has pain in left knee for 5-6/10. Her shoulder is bothering her.    Patient is accompained by: Family member    Currently in Pain? Yes    Pain Score 5     Pain Location Knee    Pain Orientation Left    Pain Descriptors / Indicators Sharp    Pain Type Chronic pain    Pain Onset More than a month ago    Pain Frequency Intermittent                 Soft tissue work to medial L knee 5' Bridge: 2 x 10 Supine hooklying bil leg lifts: 2 x 10 SLR: 2 x 10 SL hip abduction: 2 x 10 SAQ: 2 x 10, 5 sec holds  In [place]: - narrow BOS on foam with eyes closed: 3 x 5' - wide  BOS on foam with eyes closed: 3 x 30" - wide BOS on foam with horizontal and vertical head turns: 20x R and L  Sit to stand: 10lbs 2 x 10                        PT Short Term Goals - 02/27/21 1203       PT SHORT TERM GOAL #1   Title Pt will be able to ambulate 20 feet with eyes closed without AD to improve balance in low light situation    Baseline 5 feet (02/27/21)    Time 3    Period Weeks    Status New    Target Date 03/20/21      PT SHORT TERM GOAL #2   Title Pt will be able to perform 5x sit to stand without HHA <18 seconds to improve functional strength    Baseline 20 sec no HHA (02/27/21)    Time 3  Period Weeks    Status New    Target Date 03/20/21               PT Long Term Goals - 02/27/21 1205       PT LONG TERM GOAL #1   Title Pt will demo >24/30 on FGA to improve functional gait and mobility    Baseline 19/30 no AD (02/27/21)    Time 6    Period Weeks    Status New    Target Date 04/10/21      PT LONG TERM GOAL #2   Title Pt will be able to perform 5x sit to stand without HHA in <16 seconds to improve functional strength    Baseline 20 sec (02/27/21)    Time 6    Period Weeks    Status New    Target Date 04/10/21                   Plan - 03/06/21 1505     Clinical Impression Statement Today's skilled session focused on issueing and reviewing HEP to help strengthen bil hips and core strengthening/stabilization. Updated HEP.    Personal Factors and Comorbidities Age;Comorbidity 2    Comorbidities DJD back, bil knees    Examination-Activity Limitations Bend;Lift;Squat;Stairs;Stand;Toileting;Transfers    Examination-Participation Restrictions Cleaning;Community Activity;Laundry;Meal Prep;Shop    Stability/Clinical Decision Making Stable/Uncomplicated    Rehab Potential Good    PT Frequency Other (comment)   1-2x/week   PT Duration 6 weeks    PT Treatment/Interventions ADLs/Self Care Home Management;Cryotherapy;Moist  Heat;Gait training;Stair training;Functional mobility training;Therapeutic activities;Therapeutic exercise;Balance training;Manual techniques;Patient/family education;Neuromuscular re-education;Orthotic Fit/Training;Passive range of motion;Energy conservation;Joint Manipulations;Spinal Manipulations    PT Next Visit Plan continue to work on dynamic gait, balance on compliant surfaces with and without vision removed and stepping stratagies as pt reports stubbling at times.    PT Home Exercise Plan Access Code: [email or web address]    Consulted and Agree with Plan of Care Patient             Patient will benefit from skilled therapeutic intervention in order to improve the following deficits and impairments:  Abnormal gait, Decreased balance, Decreased range of motion, Decreased mobility, Difficulty walking, Decreased strength, Impaired flexibility, Increased fascial restricitons, Postural dysfunction, Pain  Visit Diagnosis: Other abnormalities of gait and mobility     Problem List Patient Active Problem List   Diagnosis Date Noted   Impaired cognition 05/03/2018   Vitamin D deficiency 12/08/2016   Overweight 10/27/2016   Postablative hypothyroidism 10/27/2016   Prediabetes 10/27/2016   Muscle spasms of neck 08/26/2016   Abnormality of gait 08/10/2016   Osteoporosis 10/13/2015   Acrochordon 07/07/2015   Jaw pain 12/30/2014   Coronary artery calcification 12/23/2014   Tachycardia 08/27/2014   Cholelithiasis 05/17/2014   GERD (gastroesophageal reflux disease) 01/02/2013   TB lung, latent 05/01/2012   Osteoarthritis 03/01/2012   Severe major depression with psychotic features ([place]) 04/01/2011   Hypothyroidism (acquired) 03/22/2011   Healthcare maintenance 12/28/2010   BPPV (benign paroxysmal positional vertigo) 11/19/2008   Hyperlipidemia 11/30/2005   Essential hypertension 11/30/2005   LOW BACK PAIN 11/30/2005    Kerrie Pleasure, PT 03/06/2021, 3:37 PM  [facility] [address] [place], [place], [address] Phone: [phone]   Fax:  [phone]  Name: Hasina Kreager MRN: [id number] Date of Birth: 03/22/1945

## 2021-03-11 ENCOUNTER — Ambulatory Visit: Payer: Medicare Other

## 2021-03-13 ENCOUNTER — Ambulatory Visit: Payer: Medicare Other

## 2021-03-13 ENCOUNTER — Ambulatory Visit: Payer: Medicare Other | Admitting: Physical Therapy

## 2021-03-13 ENCOUNTER — Telehealth: Payer: Self-pay | Admitting: Physical Therapy

## 2021-03-13 ENCOUNTER — Encounter: Payer: Self-pay | Admitting: Physical Therapy

## 2021-03-13 ENCOUNTER — Other Ambulatory Visit: Payer: Self-pay | Admitting: Cardiology

## 2021-03-13 ENCOUNTER — Other Ambulatory Visit: Payer: Self-pay

## 2021-03-13 DIAGNOSIS — R2689 Other abnormalities of gait and mobility: Secondary | ICD-10-CM

## 2021-03-13 DIAGNOSIS — R42 Dizziness and giddiness: Secondary | ICD-10-CM

## 2021-03-13 DIAGNOSIS — R2681 Unsteadiness on feet: Secondary | ICD-10-CM

## 2021-03-13 NOTE — Therapy (Signed)
[facility] [address] [place], [place], [address] Phone: [phone]   Fax:  [phone]  Physical Therapy Treatment/Re-Cert  Patient Details  Name: Doris [name] MRN: [id number] Date of Birth: 1945-07-21 Referring Provider (PT): Dr. Maia Petties   Encounter Date: 03/13/2021   PT End of Session - 03/13/21 1234     Visit Number 4    Number of Visits 10    Date for PT Re-Evaluation 04/10/21    Progress Note Due on Visit 10    PT Start Time 1232    PT Stop Time 1312    PT Time Calculation (min) 40 min    Equipment Utilized During Treatment Gait belt    Activity Tolerance Patient tolerated treatment well    Behavior During Therapy [facility] for tasks assessed/performed             Past Medical History:  Diagnosis Date   Acid reflux    Back pain    HLD (hyperlipidemia)    Hypertension    Insomnia    Migraine    Osteoarthritis    knee   Psychosis ([place])    followed by Dr. Keenan Bachelor [phone]   Thyroid nodule 2010   TSH 04/2008 = 0.296, no follow up noted in [facility]   Vertigo     Past Surgical History:  Procedure Laterality Date   TONSILLECTOMY     UTERINE FIBROID SURGERY      There were no vitals filed for this visit.   Subjective Assessment - 03/13/21 1235     Subjective Having knee pain and sciatic pain. Knee was very sore after last session. Pt reports having some dizziness sometimes and feels unsteady during gait. Was seen at this clinic a couple years ago for BPPV and thinks it has come back. Has been doing the exercises/trying a self maneuver she learned last time to try to help. Asking about an OT referral for her shoulder.    Patient is accompained by: Family member    Currently in Pain? Yes    Pain Score 3     Pain Location Shoulder    Pain Orientation Left    Pain Type Chronic pain                     Vestibular Assessment - 03/13/21 1242       Symptom Behavior   History of  similar episodes Pt treated here in 2020 with BPPV      Positional Testing   Dix-Hallpike Dix-Hallpike Right;Dix-Hallpike Left      Dix-Hallpike Right   Dix-Hallpike Right Duration 10-15 seconds    Dix-Hallpike Right Symptoms Upbeat, right rotatory nystagmus      Dix-Hallpike Left   Dix-Hallpike Left Symptoms No nystagmus                       Vestibular Treatment/Exercise - 03/13/21 1252       Vestibular Treatment/Exercise   Vestibular Treatment Provided Canalith Repositioning    Canalith Repositioning Epley Manuever Right       EPLEY MANUEVER RIGHT   Number of Reps  1    Overall Response Improved Symptoms    Response Details  Re-assessed after 1 rep with no nystagmus/sx indicating resolution.                Balance Exercises - 03/13/21 1326       Balance Exercises: Standing   Standing Eyes Opened Foam/compliant surface;Limitations  Standing Eyes Opened Limitations on air ex with feet apart; 2 x 10 reps head turns, 2 x 10 reps head nods. Cued for slowed pace with head motions.    Standing Eyes Closed Wide (BOA);Limitations    Standing Eyes Closed Limitations on foam; wide BOS 2 x 30 seconds, feet more hip width 2 x 30 seconds, incr postural sway.                PT Education - 03/13/21 1329     Education Details Results of BPPV testing, PT to send referral request to pt's PCP for OT due to R shoulder pain    Person(s) Educated Patient    Methods Explanation    Comprehension Verbalized understanding              PT Short Term Goals - 02/27/21 1203       PT SHORT TERM GOAL #1   Title Pt will be able to ambulate 20 feet with eyes closed without AD to improve balance in low light situation    Baseline 5 feet (02/27/21)    Time 3    Period Weeks    Status New    Target Date 03/20/21      PT SHORT TERM GOAL #2   Title Pt will be able to perform 5x sit to stand without HHA <18 seconds to improve functional strength    Baseline 20 sec  no HHA (02/27/21)    Time 3    Period Weeks    Status New    Target Date 03/20/21               PT Long Term Goals - 02/27/21 1205       PT LONG TERM GOAL #1   Title Pt will demo >24/30 on FGA to improve functional gait and mobility    Baseline 19/30 no AD (02/27/21)    Time 6    Period Weeks    Status New    Target Date 04/10/21      PT LONG TERM GOAL #2   Title Pt will be able to perform 5x sit to stand without HHA in <16 seconds to improve functional strength    Baseline 20 sec (02/27/21)    Time 6    Period Weeks    Status New    Target Date 04/10/21                   Plan - 03/13/21 1324     Clinical Impression Statement At start of session pt reporting having some episodes of dizziness and feeling unsteady with gait. Pt has been treated at this location in the past for BPPV and feels like it might have come back. Pt demonstrating R posterior canalithiasis. Treated with x1 rep of epley maneuver with resolution of sx. Will continue to monitor to see if sx come back. Remainder of session focused on balance with eyes closed/head motions. New certification sent to referring MD to include canalith repositioning.    Personal Factors and Comorbidities Age;Comorbidity 2    Comorbidities DJD back, bil knees, hx of BPPV    Examination-Activity Limitations Bend;Lift;Squat;Stairs;Stand;Toileting;Transfers    Examination-Participation Restrictions Cleaning;Community Activity;Laundry;Meal Prep;Shop    Stability/Clinical Decision Making Stable/Uncomplicated    Rehab Potential Good    PT Frequency Other (comment)   1-2x/week   PT Duration 6 weeks    PT Treatment/Interventions ADLs/Self Care Home Management;Cryotherapy;Moist Heat;Gait training;Stair training;Functional mobility training;Therapeutic activities;Therapeutic exercise;Balance training;Manual techniques;Patient/family education;Neuromuscular re-education;Orthotic Fit/Training;Passive  range of motion;Energy  conservation;Joint Manipulations;Spinal Manipulations;Canalith Repostioning;Vestibular    PT Next Visit Plan re-assess for BPPV if indicated. pt had R posterior canal BPPV. continue to work on dynamic gait, balance on compliant surfaces with and without vision removed and stepping stratagies as pt reports stubbling at times. did OT referral come through?    PT Home Exercise Plan Access Code: [email or web address]    Consulted and Agree with Plan of Care Patient             Patient will benefit from skilled therapeutic intervention in order to improve the following deficits and impairments:  Abnormal gait, Decreased balance, Decreased range of motion, Decreased mobility, Difficulty walking, Decreased strength, Impaired flexibility, Increased fascial restricitons, Postural dysfunction, Pain, Dizziness  Visit Diagnosis: Other abnormalities of gait and mobility  Dizziness and giddiness  Unsteadiness on feet     Problem List Patient Active Problem List   Diagnosis Date Noted   Impaired cognition 05/03/2018   Vitamin D deficiency 12/08/2016   Overweight 10/27/2016   Postablative hypothyroidism 10/27/2016   Prediabetes 10/27/2016   Muscle spasms of neck 08/26/2016   Abnormality of gait 08/10/2016   Osteoporosis 10/13/2015   Acrochordon 07/07/2015   Jaw pain 12/30/2014   Coronary artery calcification 12/23/2014   Tachycardia 08/27/2014   Cholelithiasis 05/17/2014   GERD (gastroesophageal reflux disease) 01/02/2013   TB lung, latent 05/01/2012   Osteoarthritis 03/01/2012   Severe major depression with psychotic features ([place]) 04/01/2011   Hypothyroidism (acquired) 03/22/2011   Healthcare maintenance 12/28/2010   BPPV (benign paroxysmal positional vertigo) 11/19/2008   Hyperlipidemia 11/30/2005   Essential hypertension 11/30/2005   LOW BACK PAIN 11/30/2005    Arliss Journey, PT 03/13/2021, 1:32 PM  [facility] [address]  [place], [place], [address] Phone: [phone]   Fax:  [phone]  Name: Tae Robak MRN: [id number] Date of Birth: 04/10/1945

## 2021-03-13 NOTE — Telephone Encounter (Signed)
Faxed as provider not in Epic:  Dr. Maia Petties,   [name] has been seen by PT at [facility]. The patient would benefit from an OT evaluation for R shoulder pain/decr ROM.   If you agree, please place an order in [facility] workque in [facility] or fax the order to [phone]. Thank you, Janann August, PT, DPT 03/13/21 1:35 PM    [facility] [address] [place], [place]  [address] Phone:  [phone] Fax:  [phone]

## 2021-03-18 ENCOUNTER — Ambulatory Visit: Payer: Medicare Other | Attending: Internal Medicine | Admitting: Physical Therapy

## 2021-03-18 ENCOUNTER — Other Ambulatory Visit: Payer: Self-pay

## 2021-03-18 ENCOUNTER — Encounter: Payer: Self-pay | Admitting: Physical Therapy

## 2021-03-18 DIAGNOSIS — R2681 Unsteadiness on feet: Secondary | ICD-10-CM | POA: Insufficient documentation

## 2021-03-18 DIAGNOSIS — R42 Dizziness and giddiness: Secondary | ICD-10-CM | POA: Diagnosis present

## 2021-03-18 DIAGNOSIS — R2689 Other abnormalities of gait and mobility: Secondary | ICD-10-CM | POA: Insufficient documentation

## 2021-03-18 NOTE — Therapy (Signed)
[facility] [address] [place], [place], [address] Phone: [phone]   Fax:  [phone]  Physical Therapy Treatment  Patient Details  Name: Doris [name] MRN: [id number] Date of Birth: 1945-11-10 Referring Provider (PT): Dr. Maia Petties   Encounter Date: 03/18/2021   PT End of Session - 03/18/21 1236     Visit Number 5    Number of Visits 10    Date for PT Re-Evaluation 04/10/21    Progress Note Due on Visit 10    PT Start Time 1232    PT Stop Time 1311    PT Time Calculation (min) 39 min    Equipment Utilized During Treatment Gait belt    Activity Tolerance Patient tolerated treatment well    Behavior During Therapy [facility] for tasks assessed/performed             Past Medical History:  Diagnosis Date   Acid reflux    Back pain    HLD (hyperlipidemia)    Hypertension    Insomnia    Migraine    Osteoarthritis    knee   Psychosis ([place])    followed by Dr. Keenan Bachelor [phone]   Thyroid nodule 2010   TSH 04/2008 = 0.296, no follow up noted in [facility]   Vertigo     Past Surgical History:  Procedure Laterality Date   TONSILLECTOMY     UTERINE FIBROID SURGERY      There were no vitals filed for this visit.   Subjective Assessment - 03/18/21 1234     Subjective No new complaints. Has not had any more symptoms since last session when BPPV was addressed. Does still feel she has to move her head slowly at times.    Pain Score 4     Pain Location Shoulder    Pain Orientation Left    Pain Descriptors / Indicators Aching;Sharp    Pain Type Chronic pain    Pain Onset More than a month ago    Pain Frequency Intermittent    Aggravating Factors  certain movements    Pain Relieving Factors resting, medicated creame                      [facility] Adult PT Treatment/Exercise - 03/18/21 1236       Transfers   Transfers Sit to Stand;Stand to Sit    Sit to Stand 5: Supervision    Stand to Sit 5:  Supervision      Ambulation/Gait   Ambulation/Gait Yes    Ambulation/Gait Assistance 5: Supervision    Ambulation Distance (Feet) --   around clinic with session   Assistive device None    Gait Pattern Step-through pattern;Decreased stride length    Ambulation Surface Level;Indoor      Neuro Re-ed    Neuro Re-ed Details  for muslce re-ed/coordination/balance: gait around track working on scanning all directions randomly, speed changes and sudden stops/starts with min guard assist.      Knee/Hip Exercises: Aerobic   Nustep --    Other Aerobic Scifit level 3.0 with LE's only x 8 minutes with goal >/= 100 steps per minute for strenthening and activity tolerance.                 Balance Exercises - 03/18/21 1250       Balance Exercises: Standing   Rockerboard Anterior/posterior;Lateral;EO;EC;Head turns;30 seconds;Other reps (comment);Limitations    Rockerboard Limitations performed both ways on balance board with no to light  UE support: rocking the board with emphasis on tall posture with EO, progressing to EC with no support, occasional touch to bars as needed; then holding the board steady for EC 30 sec's x 3 rep with no UE support, occasional touch to bars, then while holding the board steady for EC head movements left<>right, up<>down for ~10 reps each with light UE fingertip touch to bars. min guard to min assist for balance with cues on posture and weight shifting to assist with balance.                  PT Short Term Goals - 02/27/21 1203       PT SHORT TERM GOAL #1   Title Pt will be able to ambulate 20 feet with eyes closed without AD to improve balance in low light situation    Baseline 5 feet (02/27/21)    Time 3    Period Weeks    Status New    Target Date 03/20/21      PT SHORT TERM GOAL #2   Title Pt will be able to perform 5x sit to stand without HHA <18 seconds to improve functional strength    Baseline 20 sec no HHA (02/27/21)    Time 3    Period  Weeks    Status New    Target Date 03/20/21               PT Long Term Goals - 02/27/21 1205       PT LONG TERM GOAL #1   Title Pt will demo >24/30 on FGA to improve functional gait and mobility    Baseline 19/30 no AD (02/27/21)    Time 6    Period Weeks    Status New    Target Date 04/10/21      PT LONG TERM GOAL #2   Title Pt will be able to perform 5x sit to stand without HHA in <16 seconds to improve functional strength    Baseline 20 sec (02/27/21)    Time 6    Period Weeks    Status New    Target Date 04/10/21                   Plan - 03/18/21 1236     Clinical Impression Statement Today's skilled session continued to focus on strengthening and balance training with no issues noted or reported in session. No dizziness with balance ex's performed. The pt is making steady progress toward goals and should benefit from continued PT to progress toward unmet goals.    Personal Factors and Comorbidities Age;Comorbidity 2    Comorbidities DJD back, bil knees, hx of BPPV    Examination-Activity Limitations Bend;Lift;Squat;Stairs;Stand;Toileting;Transfers    Examination-Participation Restrictions Cleaning;Community Activity;Laundry;Meal Prep;Shop    Stability/Clinical Decision Making Stable/Uncomplicated    Rehab Potential Good    PT Frequency Other (comment)   1-2x/week   PT Duration 6 weeks    PT Treatment/Interventions ADLs/Self Care Home Management;Cryotherapy;Moist Heat;Gait training;Stair training;Functional mobility training;Therapeutic activities;Therapeutic exercise;Balance training;Manual techniques;Patient/family education;Neuromuscular re-education;Orthotic Fit/Training;Passive range of motion;Energy conservation;Joint Manipulations;Spinal Manipulations;Canalith Repostioning;Vestibular    PT Next Visit Plan . continue to work on dynamic gait, balance on compliant surfaces with and without vision removed and stepping stratagies as pt reports stubbling at  times. did OT referral come through? Recheck for right posterior BPPV as needed.    PT Home Exercise Plan Access Code: [email or web address]    Consulted and Agree with Plan of Care Patient  Patient will benefit from skilled therapeutic intervention in order to improve the following deficits and impairments:  Abnormal gait, Decreased balance, Decreased range of motion, Decreased mobility, Difficulty walking, Decreased strength, Impaired flexibility, Increased fascial restricitons, Postural dysfunction, Pain, Dizziness  Visit Diagnosis: Other abnormalities of gait and mobility  Dizziness and giddiness  Unsteadiness on feet     Problem List Patient Active Problem List   Diagnosis Date Noted   Impaired cognition 05/03/2018   Vitamin D deficiency 12/08/2016   Overweight 10/27/2016   Postablative hypothyroidism 10/27/2016   Prediabetes 10/27/2016   Muscle spasms of neck 08/26/2016   Abnormality of gait 08/10/2016   Osteoporosis 10/13/2015   Acrochordon 07/07/2015   Jaw pain 12/30/2014   Coronary artery calcification 12/23/2014   Tachycardia 08/27/2014   Cholelithiasis 05/17/2014   GERD (gastroesophageal reflux disease) 01/02/2013   TB lung, latent 05/01/2012   Osteoarthritis 03/01/2012   Severe major depression with psychotic features ([place]) 04/01/2011   Hypothyroidism (acquired) 03/22/2011   Healthcare maintenance 12/28/2010   BPPV (benign paroxysmal positional vertigo) 11/19/2008   Hyperlipidemia 11/30/2005   Essential hypertension 11/30/2005   LOW BACK PAIN 11/30/2005    Willow Ora, PTA, [facility] Outpatient Neuro [facility] [address] [place], [place] [address] [phone] 03/18/21, 8:03 PM   Name: Maxi Carreras MRN: [id number] Date of Birth: December 31, 1945

## 2021-03-20 ENCOUNTER — Ambulatory Visit: Payer: Medicare Other

## 2021-03-20 ENCOUNTER — Other Ambulatory Visit: Payer: Self-pay

## 2021-03-20 DIAGNOSIS — R2689 Other abnormalities of gait and mobility: Secondary | ICD-10-CM | POA: Diagnosis not present

## 2021-03-20 DIAGNOSIS — R42 Dizziness and giddiness: Secondary | ICD-10-CM

## 2021-03-20 DIAGNOSIS — R2681 Unsteadiness on feet: Secondary | ICD-10-CM

## 2021-03-20 NOTE — Therapy (Addendum)
[facility] [address] [place], [place], [address] Phone: [phone]   Fax:  [phone]  Physical Therapy Treatment  Patient Details  Name: Doris [name] MRN: [id number] Date of Birth: [birth year] Referring Provider (PT): Dr. Maia Petties   Encounter Date: 03/20/2021   PT End of Session - 03/20/21 1112     Visit Number 6    Number of Visits 10    Date for PT Re-Evaluation 04/10/21    Progress Note Due on Visit 10    PT Start Time 1100    PT Stop Time 1145    PT Time Calculation (min) 45 min    Equipment Utilized During Treatment Gait belt    Activity Tolerance Patient tolerated treatment well    Behavior During Therapy [facility] for tasks assessed/performed             Past Medical History:  Diagnosis Date   Acid reflux    Back pain    HLD (hyperlipidemia)    Hypertension    Insomnia    Migraine    Osteoarthritis    knee   Psychosis ([place])    followed by Dr. Keenan Bachelor [phone]   Thyroid nodule 2010   TSH 04/2008 = 0.296, no follow up noted in [facility]   Vertigo     Past Surgical History:  Procedure Laterality Date   TONSILLECTOMY     UTERINE FIBROID SURGERY      There were no vitals filed for this visit.   Subjective Assessment - 03/20/21 1112     Subjective She is still feeling better after appley's maneuver and she is not feeling as dizzy. pt has not used walking stick for walking lately. Pt is frustrated because she still doesn't have order for hterapy for her shoulder and her shoulder is really hurting.    Currently in Pain? Yes    Pain Score 8     Pain Location Shoulder    Pain Onset More than a month ago                 Walking 10 x 20 feet with eyes closed with CGA to min A cues to maintain straight path and compensation for R sided deviation, cues for swinging arms  Walking 6 x 60 feet with eyes open with cues for arm swings, noted improved cadence and improved step  length   standing on floor: box taps: 8" box: 20x R and L no external support Standig on foam: box taps: 6" box: 20x R and L, required external support 2x for LOB  Tadenm walking fwd: 3 x 10 feet                        PT Short Term Goals - 03/20/21 1113       PT SHORT TERM GOAL #1   Title Pt will be able to ambulate 20 feet with eyes closed without AD to improve balance in low light situation    Baseline 5 feet (02/27/21); able to walk 20 feet but has mild LOB where she is able to catch her self and deviates more than 2 feet from straight line to R>L    Time 3    Period Weeks    Status Partially Met    Target Date 03/20/21      PT SHORT TERM GOAL #2   Title Pt will be able to perform 5x sit to stand without HHA <18 seconds to improve  functional strength    Baseline 20 sec no HHA (02/27/21); 9.56 sec no HHA (03/20/21)    Time 3    Period Weeks    Status Achieved    Target Date 03/20/21               PT Long Term Goals - 03/20/21 1123       PT LONG TERM GOAL #1   Title Pt will demo >24/30 on FGA to improve functional gait and mobility    Baseline 19/30 no AD (02/27/21)    Time 6    Period Weeks    Status On-going    Target Date 04/10/21      PT LONG TERM GOAL #2   Title Pt will be able to perform 5x sit to stand without HHA in <16 seconds to improve functional strength    Baseline 20 sec (02/27/21); 9.56 sec (03/20/21)    Time 6    Period Weeks    Status Achieved    Target Date 04/10/21                   Plan - 03/20/21 1124     Clinical Impression Statement Patient partially met short term goal 1 and met STG #2 and LTG #2. Patient was educated on swigning arm and noted improved cadence with arm swings and decreased stumbles with arm swings. Patient was not swigngi arms as compensation from having L shoulder pain. Patient is making good progress in therapy will continue to benefit from skilled PT.    Personal Factors and Comorbidities  Age;Comorbidity 2    Comorbidities DJD back, bil knees, hx of BPPV    Examination-Activity Limitations Bend;Lift;Squat;Stairs;Stand;Toileting;Transfers    Examination-Participation Restrictions Cleaning;Community Activity;Laundry;Meal Prep;Shop    Stability/Clinical Decision Making Stable/Uncomplicated    Rehab Potential Good    PT Frequency Other (comment)   1-2x/week   PT Duration 6 weeks    PT Treatment/Interventions ADLs/Self Care Home Management;Cryotherapy;Moist Heat;Gait training;Stair training;Functional mobility training;Therapeutic activities;Therapeutic exercise;Balance training;Manual techniques;Patient/family education;Neuromuscular re-education;Orthotic Fit/Training;Passive range of motion;Energy conservation;Joint Manipulations;Spinal Manipulations;Canalith Repostioning;Vestibular    PT Next Visit Plan Continue to work on [place], progress walking bwd; work on tandem stance; work on walking with incoroprating arm swings; Recheck for right posterior BPPV as needed. I called MD's office for OT orders and spoke to secretary who stated that MD has gotten our message but hasn't placed the order yet. She wanted to give verbal orders but we need physica orders before we can see patients for OT.    PT Home Exercise Plan Access Code: [email or web address]    Consulted and Agree with Plan of Care Patient             Patient will benefit from skilled therapeutic intervention in order to improve the following deficits and impairments:  Abnormal gait, Decreased balance, Decreased range of motion, Decreased mobility, Difficulty walking, Decreased strength, Impaired flexibility, Increased fascial restricitons, Postural dysfunction, Pain, Dizziness  Visit Diagnosis: Other abnormalities of gait and mobility  Dizziness and giddiness  Unsteadiness on feet     Problem List Patient Active Problem List   Diagnosis Date Noted   Impaired cognition 05/03/2018   Vitamin D deficiency 12/08/2016    Overweight 10/27/2016   Postablative hypothyroidism 10/27/2016   Prediabetes 10/27/2016   Muscle spasms of neck 08/26/2016   Abnormality of gait 08/10/2016   Osteoporosis 10/13/2015   Acrochordon 07/07/2015   Jaw pain 12/30/2014   Coronary artery calcification 12/23/2014   Tachycardia 08/27/2014  Cholelithiasis 05/17/2014   GERD (gastroesophageal reflux disease) 01/02/2013   TB lung, latent 05/01/2012   Osteoarthritis 03/01/2012   Severe major depression with psychotic features ([place]) 04/01/2011   Hypothyroidism (acquired) 03/22/2011   Healthcare maintenance 12/28/2010   BPPV (benign paroxysmal positional vertigo) 11/19/2008   Hyperlipidemia 11/30/2005   Essential hypertension 11/30/2005   LOW BACK PAIN 11/30/2005    Kerrie Pleasure, PT 03/20/2021, 12:31 PM  [facility] [address] [place], [place], [address] Phone: [phone]   Fax:  [phone]  Name: Juno Alers MRN: [id number] Date of Birth: 08-14-1945

## 2021-03-25 ENCOUNTER — Other Ambulatory Visit: Payer: Self-pay

## 2021-03-25 ENCOUNTER — Ambulatory Visit: Payer: Medicare Other | Admitting: Physical Therapy

## 2021-03-25 ENCOUNTER — Encounter: Payer: Self-pay | Admitting: Physical Therapy

## 2021-03-25 DIAGNOSIS — R2689 Other abnormalities of gait and mobility: Secondary | ICD-10-CM | POA: Diagnosis not present

## 2021-03-25 DIAGNOSIS — R42 Dizziness and giddiness: Secondary | ICD-10-CM

## 2021-03-25 DIAGNOSIS — R2681 Unsteadiness on feet: Secondary | ICD-10-CM

## 2021-03-25 NOTE — Therapy (Signed)
[name] [address] [place], [place], [address] Phone: [phone]   Fax:  [phone]  Physical Therapy Treatment  Patient Details  Name: Doris [name] MRN: [id number] Date of Birth: 1945/02/18 Referring Provider (PT): Dr. Maia [name]   Encounter Date: 03/25/2021   PT End of Session - 03/25/21 1110     Visit Number 7    Number of Visits 10    Date for PT Re-Evaluation 04/10/21    Progress Note Due on Visit 10    PT Start Time 1102    PT Stop Time 1141    PT Time Calculation (min) 39 min    Equipment Utilized During Treatment Gait belt    Activity Tolerance Patient tolerated treatment well    Behavior During Therapy [facility] for tasks assessed/performed             Past Medical History:  Diagnosis Date   Acid reflux    Back pain    HLD (hyperlipidemia)    Hypertension    Insomnia    Migraine    Osteoarthritis    knee   Psychosis ([place])    followed by Dr. Keenan [name] [phone]   Thyroid nodule 2010   TSH 04/2008 = 0.296, no follow up noted in [facility]   Vertigo     Past Surgical History:  Procedure Laterality Date   TONSILLECTOMY     UTERINE FIBROID SURGERY      There were no vitals filed for this visit.   Subjective Assessment - 03/25/21 1107     Subjective No new complaints. No falls. Reports her shoulder is better, now able reach all the way up with minimal pain (4/10). Now able to use right arm with ADL's with just a little discomfort. Wants to hold on OT right now due to arm "heading in right direction:.    Currently in Pain? Yes    Pain Score 4     Pain Location Shoulder    Pain Orientation Right    Pain Descriptors / Indicators Sore;Cramping    Pain Type Chronic pain    Pain Onset More than a month ago    Pain Frequency Intermittent    Aggravating Factors  certain movements    Pain Relieving Factors resting, medicated creame                   [facility] Adult PT Treatment/Exercise  - 03/25/21 1111       Transfers   Transfers Sit to Stand;Stand to Sit    Sit to Stand 5: Supervision    Stand to Sit 5: Supervision      Ambulation/Gait   Ambulation/Gait Yes    Ambulation/Gait Assistance 5: Supervision    Ambulation Distance (Feet) --   around clinic with session   Assistive device None    Gait Pattern Step-through pattern;Decreased stride length    Ambulation Surface Level;Indoor      High Level Balance   High Level Balance Activities Side stepping;Marching forwards;Marching backwards;Tandem walking   tandem walking forward/backward   High Level Balance Comments red/blue mats next to counter top: 3 laps each/each way with intermittent touch to counter. min guard to min assist for balance. cues on form/technique and to slow down for improved weight shifting.      Knee/Hip Exercises: Aerobic   Other Aerobic Scifit level 4.5 with LE's only x 8 minutes with goal >/= 100 steps per minute for strenthening and activity tolerance.  Balance Exercises - 03/25/21 1132       Balance Exercises: Standing   Standing Eyes Closed Foam/compliant surface;Narrow base of support (BOS);Wide (BOA);Head turns;Other reps (comment);30 secs;Limitations    Standing Eyes Closed Limitations on airex no UE support: feet together EC 30 seconds x 3 reps, progressing to feet hip width apart for EC head movements left<>right, up<>down for ~10 reps each. min guard to min assist with posterior sway noted. cues on posture and weight shifting to assist with balance.                  PT Short Term Goals - 03/20/21 1113       PT SHORT TERM GOAL #1   Title Pt will be able to ambulate 20 feet with eyes closed without AD to improve balance in low light situation    Baseline 5 feet (02/27/21); able to walk 20 feet but has mild LOB where she is able to catch her self and deviates more than 2 feet from straight line to R>L    Time 3    Period Weeks    Status Partially Met     Target Date 03/20/21      PT SHORT TERM GOAL #2   Title Pt will be able to perform 5x sit to stand without HHA <18 seconds to improve functional strength    Baseline 20 sec no HHA (02/27/21); 9.56 sec no HHA (03/20/21)    Time 3    Period Weeks    Status Achieved    Target Date 03/20/21               PT Long Term Goals - 03/20/21 1123       PT LONG TERM GOAL #1   Title Pt will demo >24/30 on FGA to improve functional gait and mobility    Baseline 19/30 no AD (02/27/21)    Time 6    Period Weeks    Status On-going    Target Date 04/10/21      PT LONG TERM GOAL #2   Title Pt will be able to perform 5x sit to stand without HHA in <16 seconds to improve functional strength    Baseline 20 sec (02/27/21); 9.56 sec (03/20/21)    Time 6    Period Weeks    Status Achieved    Target Date 04/10/21                   Plan - 03/25/21 1111     Clinical Impression Statement Today's skilled session continued to focus on strengthening and balance training with no issues noted or reported in session. The pt is making steady progress and should benefit from continued PT to progress toward unmet goals.    Personal Factors and Comorbidities Age;Comorbidity 2    Comorbidities DJD back, bil knees, hx of BPPV    Examination-Activity Limitations Bend;Lift;Squat;Stairs;Stand;Toileting;Transfers    Examination-Participation Restrictions Cleaning;Community Activity;Laundry;Meal Prep;Shop    Stability/Clinical Decision Making Stable/Uncomplicated    Rehab Potential Good    PT Frequency Other (comment)   1-2x/week   PT Duration 6 weeks    PT Treatment/Interventions ADLs/Self Care Home Management;Cryotherapy;Moist Heat;Gait training;Stair training;Functional mobility training;Therapeutic activities;Therapeutic exercise;Balance training;Manual techniques;Patient/family education;Neuromuscular re-education;Orthotic Fit/Training;Passive range of motion;Energy conservation;Joint  Manipulations;Spinal Manipulations;Canalith Repostioning;Vestibular    PT Next Visit Plan Continue to work on [place], progress walking bwd; work on tandem stance; work on walking with incoroprating arm swings; Recheck for right posterior BPPV as needed.  PT Home Exercise Plan Access Code: [email or web address]    Consulted and Agree with Plan of Care Patient             Patient will benefit from skilled therapeutic intervention in order to improve the following deficits and impairments:  Abnormal gait, Decreased balance, Decreased range of motion, Decreased mobility, Difficulty walking, Decreased strength, Impaired flexibility, Increased fascial restricitons, Postural dysfunction, Pain, Dizziness  Visit Diagnosis: Other abnormalities of gait and mobility  Dizziness and giddiness  Unsteadiness on feet     Problem List Patient Active Problem List   Diagnosis Date Noted   Impaired cognition 05/03/2018   Vitamin D deficiency 12/08/2016   Overweight 10/27/2016   Postablative hypothyroidism 10/27/2016   Prediabetes 10/27/2016   Muscle spasms of neck 08/26/2016   Abnormality of gait 08/10/2016   Osteoporosis 10/13/2015   Acrochordon 07/07/2015   Jaw pain 12/30/2014   Coronary artery calcification 12/23/2014   Tachycardia 08/27/2014   Cholelithiasis 05/17/2014   GERD (gastroesophageal reflux disease) 01/02/2013   TB lung, latent 05/01/2012   Osteoarthritis 03/01/2012   Severe major depression with psychotic features ([place]) 04/01/2011   Hypothyroidism (acquired) 03/22/2011   Healthcare maintenance 12/28/2010   BPPV (benign paroxysmal positional vertigo) 11/19/2008   Hyperlipidemia 11/30/2005   Essential hypertension 11/30/2005   LOW BACK PAIN 11/30/2005    Willow Ora, PTA, [facility] Outpatient Neuro [facility] [address], [place], [place] [address] [phone] 03/25/21, 4:32 PM   Name: Doris [name] MRN: [id number] Date of Birth: [birth year]

## 2021-03-27 ENCOUNTER — Ambulatory Visit: Payer: Medicare Other

## 2021-03-27 ENCOUNTER — Other Ambulatory Visit: Payer: Self-pay

## 2021-03-27 DIAGNOSIS — R2689 Other abnormalities of gait and mobility: Secondary | ICD-10-CM

## 2021-03-27 DIAGNOSIS — R42 Dizziness and giddiness: Secondary | ICD-10-CM

## 2021-03-27 DIAGNOSIS — R2681 Unsteadiness on feet: Secondary | ICD-10-CM

## 2021-03-27 NOTE — Therapy (Signed)
[facility] [address] [place], [place], [address] Phone: [phone]   Fax:  [phone]  Physical Therapy Discharge Note  Patient Details  Name: Doris [name] MRN: [id number] Date of Birth: 05-10-1945 Referring Provider (PT): Dr. Maia Petties   Encounter Date: 03/27/2021   PT End of Session - 03/27/21 1109     Visit Number 8    Number of Visits 10    Date for PT Re-Evaluation 04/10/21    Progress Note Due on Visit 10    PT Start Time 1105    PT Stop Time 1135    PT Time Calculation (min) 30 min    Equipment Utilized During Treatment Gait belt    Activity Tolerance Patient tolerated treatment well    Behavior During Therapy [facility] for tasks assessed/performed             Past Medical History:  Diagnosis Date   Acid reflux    Back pain    HLD (hyperlipidemia)    Hypertension    Insomnia    Migraine    Osteoarthritis    knee   Psychosis ([facility])    followed by Dr. Keenan Bachelor [phone]   Thyroid nodule 2010   TSH 04/2008 = 0.296, no follow up noted in [facility]   Vertigo     Past Surgical History:  Procedure Laterality Date   TONSILLECTOMY     UTERINE FIBROID SURGERY      There were no vitals filed for this visit.    Subjective Assessment - 03/27/21 1118     Subjective No new complaints. No falls. Reports her shoulder is better, now able reach all the way up with minimal pain (4/10). Now able to use right arm with ADL's with just a little discomfort. Wants to hold on OT right now due to arm "heading in right direction:.    Pain Onset More than a month ago                [facility] PT Assessment - 03/27/21 0001       Functional Gait  Assessment   Gait assessed  Yes    Gait Level Surface Walks 20 ft in less than 5.5 sec, no assistive devices, good speed, no evidence for imbalance, normal gait pattern, deviates no more than 6 in outside of the 12 in walkway width.    Change in Gait Speed Able to  smoothly change walking speed without loss of balance or gait deviation. Deviate no more than 6 in outside of the 12 in walkway width.    Gait with Horizontal Head Turns Performs head turns smoothly with no change in gait. Deviates no more than 6 in outside 12 in walkway width    Gait with Vertical Head Turns Performs head turns with no change in gait. Deviates no more than 6 in outside 12 in walkway width.    Gait and Pivot Turn Pivot turns safely within 3 sec and stops quickly with no loss of balance.    Step Over Obstacle Is able to step over 2 stacked shoe boxes taped together (9 in total height) without changing gait speed. No evidence of imbalance.    Gait with Narrow Base of Support Ambulates 7-9 steps.    Gait with Eyes Closed Walks 20 ft, uses assistive device, slower speed, mild gait deviations, deviates 6-10 in outside 12 in walkway width. Ambulates 20 ft in less than 9 sec but greater than 7 sec.    Ambulating Backwards  Walks 20 ft, uses assistive device, slower speed, mild gait deviations, deviates 6-10 in outside 12 in walkway width.    Steps Alternating feet, must use rail.    Total Score 26    FGA comment: 26/30                        Objective measurements completed on examination: See above findings.                  PT Short Term Goals - 03/20/21 1113       PT SHORT TERM GOAL #1   Title Pt will be able to ambulate 20 feet with eyes closed without AD to improve balance in low light situation    Baseline 5 feet (02/27/21); able to walk 20 feet but has mild LOB where she is able to catch her self and deviates more than 2 feet from straight line to R>L    Time 3    Period Weeks    Status Partially Met    Target Date 03/20/21      PT SHORT TERM GOAL #2   Title Pt will be able to perform 5x sit to stand without HHA <18 seconds to improve functional strength    Baseline 20 sec no HHA (02/27/21); 9.56 sec no HHA (03/20/21)    Time 3    Period Weeks     Status Achieved    Target Date 03/20/21               PT Long Term Goals - 03/27/21 1127       PT LONG TERM GOAL #1   Title Pt will demo >24/30 on FGA to improve functional gait and mobility    Baseline 19/30 no AD (02/27/21); 26/30 no AD (03/27/21).    Time 6    Period Weeks    Status Achieved    Target Date 04/10/21      PT LONG TERM GOAL #2   Title Pt will be able to perform 5x sit to stand without HHA in <16 seconds to improve functional strength    Baseline 20 sec (02/27/21); 9.56 sec (03/20/21)    Time 6    Period Weeks    Status Achieved    Target Date 04/10/21                    Plan - 03/27/21 1114     Clinical Impression Statement Patient has been seen for toal of 8 sessions for gait and mobility disorder from 02/27/21. Patient has met all of her goals and in compliant with HEP to self manage her symptoms. Patient will be discharge dfrom skilled PT.    Personal Factors and Comorbidities Age;Comorbidity 2    Comorbidities DJD back, bil knees, hx of BPPV    Examination-Activity Limitations Bend;Lift;Squat;Stairs;Stand;Toileting;Transfers    Examination-Participation Restrictions Cleaning;Community Activity;Laundry;Meal Prep;Shop    Stability/Clinical Decision Making Stable/Uncomplicated    Rehab Potential Good    PT Frequency --   1-2x/week   PT Duration --    PT Treatment/Interventions Cryotherapy;Moist Heat;Gait training;Stair training;Functional mobility training;Therapeutic activities;Therapeutic exercise;Balance training;Manual techniques;Patient/family education;Orthotic Fit/Training;Passive range of motion;Energy conservation;Joint Manipulations;Spinal Manipulations;Canalith Repostioning;Vestibular    PT Next Visit Plan Continue to work on [place], progress walking bwd; work on tandem stance; work on walking with incoroprating arm swings; Recheck for right posterior BPPV as needed.    PT Home Exercise Plan Access Code: [email or web address]    [email or web address]  and Agree with Plan of Care Patient             Patient will benefit from skilled therapeutic intervention in order to improve the following deficits and impairments:  Abnormal gait, Decreased balance, Decreased range of motion, Decreased mobility, Difficulty walking, Decreased strength, Impaired flexibility, Increased fascial restricitons, Postural dysfunction, Pain, Dizziness  Visit Diagnosis: Other abnormalities of gait and mobility  Dizziness and giddiness  Unsteadiness on feet     Problem List Patient Active Problem List   Diagnosis Date Noted   Impaired cognition 05/03/2018   Vitamin D deficiency 12/08/2016   Overweight 10/27/2016   Postablative hypothyroidism 10/27/2016   Prediabetes 10/27/2016   Muscle spasms of neck 08/26/2016   Abnormality of gait 08/10/2016   Osteoporosis 10/13/2015   Acrochordon 07/07/2015   Jaw pain 12/30/2014   Coronary artery calcification 12/23/2014   Tachycardia 08/27/2014   Cholelithiasis 05/17/2014   GERD (gastroesophageal reflux disease) 01/02/2013   TB lung, latent 05/01/2012   Osteoarthritis 03/01/2012   Severe major depression with psychotic features ([facility]) 04/01/2011   Hypothyroidism (acquired) 03/22/2011   Healthcare maintenance 12/28/2010   BPPV (benign paroxysmal positional vertigo) 11/19/2008   Hyperlipidemia 11/30/2005   Essential hypertension 11/30/2005   LOW BACK PAIN 11/30/2005    Kerrie Pleasure, PT 03/27/2021, 11:35 AM  [facility] [address] [place], [place], [address] Phone: [phone]   Fax:  [phone]  Name: Doris [name] MRN: [id number] Date of Birth: [birth year]

## 2021-04-01 ENCOUNTER — Ambulatory Visit: Payer: Medicare Other | Admitting: Physical Therapy

## 2021-04-03 ENCOUNTER — Ambulatory Visit: Payer: Medicare Other

## 2021-04-27 ENCOUNTER — Encounter: Payer: Medicare Other | Admitting: Occupational Therapy

## 2021-04-28 ENCOUNTER — Encounter: Payer: Medicare Other | Attending: Psychology | Admitting: Psychology

## 2021-04-28 ENCOUNTER — Encounter: Payer: Self-pay | Admitting: Psychology

## 2021-04-28 ENCOUNTER — Other Ambulatory Visit: Payer: Self-pay

## 2021-04-28 DIAGNOSIS — R296 Repeated falls: Secondary | ICD-10-CM | POA: Diagnosis not present

## 2021-04-28 DIAGNOSIS — G3184 Mild cognitive impairment, so stated: Secondary | ICD-10-CM | POA: Insufficient documentation

## 2021-04-28 DIAGNOSIS — R000 Tachycardia, unspecified: Secondary | ICD-10-CM | POA: Diagnosis not present

## 2021-04-28 DIAGNOSIS — R7303 Prediabetes: Secondary | ICD-10-CM | POA: Diagnosis not present

## 2021-04-28 DIAGNOSIS — G894 Chronic pain syndrome: Secondary | ICD-10-CM | POA: Diagnosis not present

## 2021-04-28 DIAGNOSIS — E785 Hyperlipidemia, unspecified: Secondary | ICD-10-CM | POA: Diagnosis not present

## 2021-04-28 DIAGNOSIS — R269 Unspecified abnormalities of gait and mobility: Secondary | ICD-10-CM | POA: Diagnosis not present

## 2021-04-28 DIAGNOSIS — E559 Vitamin D deficiency, unspecified: Secondary | ICD-10-CM | POA: Diagnosis not present

## 2021-04-28 DIAGNOSIS — F329 Major depressive disorder, single episode, unspecified: Secondary | ICD-10-CM | POA: Diagnosis not present

## 2021-04-28 DIAGNOSIS — M549 Dorsalgia, unspecified: Secondary | ICD-10-CM | POA: Diagnosis not present

## 2021-04-28 DIAGNOSIS — F4310 Post-traumatic stress disorder, unspecified: Secondary | ICD-10-CM | POA: Insufficient documentation

## 2021-04-28 DIAGNOSIS — Z9181 History of falling: Secondary | ICD-10-CM | POA: Diagnosis not present

## 2021-04-28 DIAGNOSIS — H8110 Benign paroxysmal vertigo, unspecified ear: Secondary | ICD-10-CM | POA: Diagnosis not present

## 2021-04-28 DIAGNOSIS — M542 Cervicalgia: Secondary | ICD-10-CM | POA: Diagnosis not present

## 2021-04-28 DIAGNOSIS — E039 Hypothyroidism, unspecified: Secondary | ICD-10-CM | POA: Diagnosis not present

## 2021-04-28 DIAGNOSIS — Z79899 Other long term (current) drug therapy: Secondary | ICD-10-CM | POA: Diagnosis not present

## 2021-04-28 DIAGNOSIS — I10 Essential (primary) hypertension: Secondary | ICD-10-CM | POA: Diagnosis not present

## 2021-04-28 NOTE — Progress Notes (Signed)
04/28/2021 10 AM-11 AM: Today's visit was an in person visit that was conducted in my outpatient clinic office.  The patient myself were present for this feedback.  We reviewed the results of the recent neuropsychological evaluation and talked about treatment recommendations going forward.  The results are not consistent with any type of progressive neurological dementia but is consistent with a multitude of variables playing some role or another.  The patient has multiple past concussive events, underlying history of depression and PTSD, significant pain and sleep disturbance.  I have included the summary from the formal neuropsychological evaluation below for convenience.  It can be found in its entirety in the patient's EMR dated 01/21/2021. ? ? ?Summary of Results:                        Overall, the results of the current neuropsychological evaluation do show that there are some consistent deficits with regard to attentional variables particularly for auditory encoding variables and some mild relative weaknesses on measures of visual reasoning and problem-solving.  The patient's memory functions actually appeared to be quite good once adjustments were made for her auditory encoding deficits.  The patient appeared to only have mild issues initially learning auditory information and did quite well on visual memory components.  The patient's best performance was on delayed memory index which suggest the patient was able to effectively store and organize new information and retain that information over a period of delay.  There was some reduction in verbal fluency noted as well and this would suggest that some of the issues have to do with retrieval of information rather than an inability to learn or store new information. ?  ?Impression/Diagnosis:                     Overall, the results of the current neuropsychological evaluation are quite good.  There was no clear indication of any type of progressive  neurological dementia and the primary deficits that were seen had to do with effective auditory encoding capacity and some visual reasoning and problem-solving capacity.  The difficulties that the patient is experiencing are likely related to multiple variables including her past history of multiple concussive events, underlying significant psychiatric history and significant pain/sleep disturbance.  The patient is also taking alprazolam to help manage her significant underlying anxiety and PTSD symptoms and while this medication can be helpful for intrusive thoughts and startle responses it can also have a deleterious impact on attention and memory/learning functions.  I suspect that the combination of her pain, PTSD, postconcussive events and potential impacts of medications are the culprit for her subjective experiences of memory difficulties in real world day-to-day situations.  The patient is clearly not showing any deficits consistent with a progressive cortical or subcortical dementia such as Alzheimer's, Lewy body or other progressive dementias. ? ?As far as treatment recommendations, the patient is experiencing some mild cognitive deficits due to multiple etiological factors.  I would suggest continued efforts to work on treating her underlying PTSD symptoms and potential for depressive events as well as continuing to work on her pain symptoms would be quite beneficial to the patient.  Cautious use of benzodiazepines and awareness of how they may impact memory and learning will also be important.  The patient should place emphasis on having regular physical activity within the limits of her chronic pain symptoms, maintaining a very good dietary pattern including lots of vegetables/whole grains, and fruits  that have edible peels, and maintenance of good sleep hygiene patterns.  I will sit down with the patient and go over the results of the current neuropsychological evaluation and go into greater depth with  some of these recommendations. ?  ?Diagnosis:                              ?  ?Mild cognitive impairment ?  ?Abnormality of gait ?  ?Chronic pain syndrome ?  ?  ?_____________________ ?Ilean Skill, Psy.D. ?Clinical Neuropsychologist ?

## 2021-04-29 ENCOUNTER — Encounter: Payer: Self-pay | Admitting: Occupational Therapy

## 2021-04-29 ENCOUNTER — Ambulatory Visit: Payer: Medicare Other | Attending: Internal Medicine | Admitting: Occupational Therapy

## 2021-04-29 DIAGNOSIS — M79641 Pain in right hand: Secondary | ICD-10-CM

## 2021-04-29 DIAGNOSIS — R2689 Other abnormalities of gait and mobility: Secondary | ICD-10-CM | POA: Diagnosis present

## 2021-04-29 DIAGNOSIS — M6281 Muscle weakness (generalized): Secondary | ICD-10-CM

## 2021-04-29 DIAGNOSIS — M25511 Pain in right shoulder: Secondary | ICD-10-CM | POA: Diagnosis present

## 2021-04-29 DIAGNOSIS — R278 Other lack of coordination: Secondary | ICD-10-CM | POA: Diagnosis present

## 2021-04-29 DIAGNOSIS — G8929 Other chronic pain: Secondary | ICD-10-CM

## 2021-04-29 DIAGNOSIS — M25611 Stiffness of right shoulder, not elsewhere classified: Secondary | ICD-10-CM | POA: Diagnosis not present
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
## 2021-05-01 ENCOUNTER — Encounter: Payer: Self-pay | Admitting: Cardiology

## 2021-05-01 ENCOUNTER — Other Ambulatory Visit: Payer: Self-pay

## 2021-05-01 ENCOUNTER — Ambulatory Visit (INDEPENDENT_AMBULATORY_CARE_PROVIDER_SITE_OTHER): Payer: Medicare Other | Admitting: Cardiology

## 2021-05-01 DIAGNOSIS — R0609 Other forms of dyspnea: Secondary | ICD-10-CM | POA: Diagnosis not present

## 2021-05-01 DIAGNOSIS — I2584 Coronary atherosclerosis due to calcified coronary lesion: Secondary | ICD-10-CM | POA: Diagnosis not present

## 2021-05-01 DIAGNOSIS — I2510 Atherosclerotic heart disease of native coronary artery without angina pectoris: Secondary | ICD-10-CM

## 2021-05-01 DIAGNOSIS — R000 Tachycardia, unspecified: Secondary | ICD-10-CM | POA: Diagnosis not present

## 2021-05-01 DIAGNOSIS — I10 Essential (primary) hypertension: Secondary | ICD-10-CM

## 2021-05-01 NOTE — Assessment & Plan Note (Signed)
-  LAD RCA on prior CT scan of the chest.  She is currently on Crestor.  Goal LDL less than 70. ?-Lipids are being checked by Dr. Maia Petties. ?

## 2021-05-01 NOTE — Progress Notes (Signed)
?Cardiology Office Note:   ? ?Date:  05/01/2021  ? ?ID:  Doris [name], DOB [birth year], MRN [id number] ? ?PCP:  Audley Hose, MD ?  ?[facility] Providers ?Cardiologist:  Candee Furbish, MD    ? ?Referring MD: Audley Hose, MD  ? ? ?History of Present Illness:   ? ?Doris [name] is a 76 y.o. female here for the follow-up of hypertension coronary calcifications in the LAD and RCA seen on prior CT personally reviewed and interpreted.  She is also had tachycardia for years. ? ?Past Medical History:  ?Diagnosis Date  ? Acid reflux   ? Back pain   ? HLD (hyperlipidemia)   ? Hypertension   ? Insomnia   ? Migraine   ? Osteoarthritis   ? knee  ? Psychosis ([place])   ? followed by Dr. Keenan Bachelor [phone]  ? Thyroid nodule 2010  ? TSH 04/2008 = 0.296, no follow up noted in [facility]  ? Vertigo   ? ? ?Past Surgical History:  ?Procedure Laterality Date  ? TONSILLECTOMY    ? UTERINE FIBROID SURGERY    ? ? ?Current Medications: ?Current Meds  ?Medication Sig  ? ALPRAZolam (XANAX) 0.25 MG tablet Take 0.25 mg by mouth at bedtime as needed for anxiety.  ? amLODipine (NORVASC) 5 MG tablet TAKE 1 TABLET BY MOUTH EVERY DAY  ? Calcium Carb-Cholecalciferol 600-10 MG-MCG TABS daily at 6 (six) AM.  ? cyclobenzaprine (FLEXERIL) 10 MG tablet Take by mouth as needed for pain.  ? diclofenac Sodium (VOLTAREN) 1 % GEL Apply topically as needed.  ? DULoxetine (CYMBALTA) 60 MG capsule Take by mouth daily.  ? hydrOXYzine (ATARAX/VISTARIL) 10 MG tablet Take 10 mg by mouth.   ? levothyroxine (SYNTHROID, LEVOTHROID) 25 MCG tablet Take 25 mcg by mouth daily before breakfast.  ? lidocaine (LIDODERM) 5 % Place 1 patch onto the skin daily. Remove & Discard patch within 12 hours or as directed by MD  ? losartan (COZAAR) 50 MG tablet Take 1 tablet (50 mg total) by mouth daily. Please make yearly appt with Dr. Marlou Porch for March 2023 for future refills. Thank you 1st attempt  ? meclizine (ANTIVERT) 25 MG tablet Take 25 mg by mouth 3 (three) times  daily as needed for dizziness or nausea.  ? meloxicam (MOBIC) 15 MG tablet Take 30 mg by mouth daily.   ? metoprolol succinate (TOPROL-XL) 25 MG 24 hr tablet Take 1 tablet (25 mg total) by mouth daily.  ? omeprazole (PRILOSEC) 40 MG capsule Take 40 mg by mouth daily.  ? rosuvastatin (CRESTOR) 10 MG tablet rosuvastatin 10 mg tablet ? TAKE 1 TABLET BY MOUTH EVERYDAY AT BEDTIME  ?  ? ?Allergies:   Ibuprofen, Lidocaine, and Olanzapine  ? ?Social History  ? ?Socioeconomic History  ? Marital status: Divorced  ?  Spouse name: Not on file  ? Number of children: Not on file  ? Years of education: Not on file  ? Highest education level: Not on file  ?Occupational History  ? Occupation: retired  ?Tobacco Use  ? Smoking status: Never  ? Smokeless tobacco: Never  ?Vaping Use  ? Vaping Use: Never used  ?Substance and Sexual Activity  ? Alcohol use: No  ?  Alcohol/week: 0.0 standard drinks  ? Drug use: No  ? Sexual activity: Yes  ?Other Topics Concern  ? Not on file  ?Social History Narrative  ? Not on file  ? ?Social Determinants of Health  ? ?Financial Resource Strain: Not on file  ?  Food Insecurity: Not on file  ?Transportation Needs: Not on file  ?Physical Activity: Not on file  ?Stress: Not on file  ?Social Connections: Not on file  ?  ? ?Family History: ?The patient's family history includes Heart disease in her father, mother, and another family member. There is no history of Stroke, Cancer, or Colon cancer. ? ?ROS:   ?Please see the history of present illness.    ? All other systems reviewed and are negative. ? ?EKGs/Labs/Other Studies Reviewed:   ? ?The following studies were reviewed today: ? ?ECHO 2021: ? ? 1. Low normal to mildly reduced LV systolic function (EF 50); mild LVH;  ?grade 1 diastolic dysfunction.  ? 2. Left ventricular ejection fraction, by estimation, is 50 to 55%. The  ?left ventricle has low normal function. The left ventricle has no regional  ?wall motion abnormalities. There is mild left ventricular  hypertrophy of  ?the basal and septal segments.  ?Left ventricular diastolic parameters are consistent with Grade I  ?diastolic dysfunction (impaired relaxation).  ? 3. Right ventricular systolic function is normal. The right ventricular  ?size is normal. There is normal pulmonary artery systolic pressure.  ? 4. The mitral valve is normal in structure. Trivial mitral valve  ?regurgitation. No evidence of mitral stenosis.  ? 5. The aortic valve is tricuspid. Aortic valve regurgitation is not  ?visualized. Mild aortic valve sclerosis is present, with no evidence of  ?aortic valve stenosis.  ? 6. The inferior vena cava is normal in size with greater than 50%  ?respiratory variability, suggesting right atrial pressure of 3 mmHg.  ?  ?NUC stress 2016: ??   Nuclear stress EF: 63%. Normal LV function ??   The study is normal. No evidence of ischemia ??   This is a low risk study. ?  ? ?EKG:  EKG is  ordered today.  The ekg ordered today demonstrates sinus rhythm 99 no other abnormalities ? ?Recent Labs: ?No results found for requested labs within last 8760 hours.  ?Recent Lipid Panel ?   ?Component Value Date/Time  ? CHOL 196 05/17/2014 1527  ? TRIG 168 (H) 05/17/2014 1527  ? HDL 68 05/17/2014 1527  ? CHOLHDL 2.9 05/17/2014 1527  ? VLDL 34 05/17/2014 1527  ? [place] 94 05/17/2014 1527  ? ? ? ?Risk Assessment/Calculations:   ? ? ?    ? ?   ? ?Physical Exam:   ? ?VS:  BP (!) 140/98   Pulse 99   Ht '5\' 4"'$  (1.626 m)   Wt 166 lb 12.8 oz (75.7 kg)   LMP 03/27/1984   SpO2 96%   BMI 28.63 kg/m?    ? ?Wt Readings from Last 3 Encounters:  ?05/01/21 166 lb 12.8 oz (75.7 kg)  ?04/29/20 168 lb (76.2 kg)  ?10/25/19 168 lb (76.2 kg)  ?  ? ?GEN:  Well nourished, well developed in no acute distress ?HEENT: Normal ?NECK: No JVD; No carotid bruits ?LYMPHATICS: No lymphadenopathy ?CARDIAC: RRR, no murmurs, no rubs, gallops ?RESPIRATORY:  Clear to auscultation without rales, wheezing or rhonchi  ?ABDOMEN: Soft, non-tender,  non-distended ?MUSCULOSKELETAL:  No edema; No deformity  ?SKIN: Warm and dry ?NEUROLOGIC:  Alert and oriented x 3 ?PSYCHIATRIC:  Normal affect  ? ?ASSESSMENT:   ? ?1. Coronary artery calcification   ?2. Essential hypertension   ?3. Tachycardia   ?4. Dyspnea on exertion   ? ?PLAN:   ? ?In order of problems listed above: ? ?Coronary artery calcification ?-LAD RCA on prior  CT scan of the chest.  She is currently on Crestor.  Goal LDL less than 70. ?-Lipids are being checked by Dr. Maia Petties. ? ?Essential hypertension ?-Medications reviewed as above no side effects.  Doing well with these. ? ?Tachycardia ?-Has been a longstanding issue, continuing with Toprol.  She says that this has helped out significantly. ? ?Dyspnea ?-Overall longstanding as well.  EF has been 50% on echocardiogram.  Prior nuclear stress test in 2016 showed normal EF with no ischemia.  Continue with conditioning effort ?  ? ? ?  ? ? ?Medication Adjustments/Labs and Tests Ordered: ?Current medicines are reviewed at length with the patient today.  Concerns regarding medicines are outlined above.  ?Orders Placed This Encounter  ?Procedures  ? EKG 12-Lead  ? ?No orders of the defined types were placed in this encounter. ? ? ?Patient Instructions  ?Medication Instructions:  ?The current medical regimen is effective;  continue present plan and medications. ? ?*If you need a refill on your cardiac medications before your next appointment, please call your pharmacy* ? ?Follow-Up: ?At [facility], you and your health needs are our priority.  As part of our continuing mission to provide you with exceptional heart care, we have created designated Provider Care Teams.  These Care Teams include your primary Cardiologist (physician) and Advanced Practice Providers (APPs -  Physician Assistants and Nurse Practitioners) who all work together to provide you with the care you need, when you need it. ? ?We recommend signing up for the patient portal called "MyChart".   Sign up information is provided on this After Visit Summary.  MyChart is used to connect with patients for Virtual Visits (Telemedicine).  Patients are able to view lab/test results, encounter notes, upcoming appointments, etc

## 2021-05-01 NOTE — Assessment & Plan Note (Signed)
-  Medications reviewed as above no side effects.  Doing well with these. ?

## 2021-05-01 NOTE — Patient Instructions (Signed)
Medication Instructions:  The current medical regimen is effective;  continue present plan and medications.  *If you need a refill on your cardiac medications before your next appointment, please call your pharmacy*  Follow-Up: At [facility], you and your health needs are our priority.  As part of our continuing mission to provide you with exceptional heart care, we have created designated Provider Care Teams.  These Care Teams include your primary Cardiologist (physician) and Advanced Practice Providers (APPs -  Physician Assistants and Nurse Practitioners) who all work together to provide you with the care you need, when you need it.  We recommend signing up for the patient portal called "MyChart".  Sign up information is provided on this After Visit Summary.  MyChart is used to connect with patients for Virtual Visits (Telemedicine).  Patients are able to view lab/test results, encounter notes, upcoming appointments, etc.  Non-urgent messages can be sent to your provider as well.   To learn more about what you can do with MyChart, go to [email or web address].    Your next appointment:   1 year(s)  The format for your next appointment:   In Person  Provider:   Mark Skains, MD   Thank you for choosing [facility]!!    

## 2021-05-01 NOTE — Assessment & Plan Note (Signed)
-  Overall longstanding as well.  EF has been 50% on echocardiogram.  Prior nuclear stress test in 2016 showed normal EF with no ischemia.  Continue with conditioning effort ?

## 2021-05-01 NOTE — Assessment & Plan Note (Signed)
-  Has been a longstanding issue, continuing with Toprol.  She says that this has helped out significantly. ?

## 2021-05-05 ENCOUNTER — Ambulatory Visit: Payer: Medicare Other | Admitting: Occupational Therapy

## 2021-05-05 ENCOUNTER — Encounter: Payer: Self-pay | Admitting: Occupational Therapy

## 2021-05-05 ENCOUNTER — Other Ambulatory Visit: Payer: Self-pay

## 2021-05-05 VITALS — BP 164/106 | HR 103

## 2021-05-05 DIAGNOSIS — G8929 Other chronic pain: Secondary | ICD-10-CM

## 2021-05-05 DIAGNOSIS — M79641 Pain in right hand: Secondary | ICD-10-CM

## 2021-05-05 DIAGNOSIS — M25611 Stiffness of right shoulder, not elsewhere classified: Secondary | ICD-10-CM

## 2021-05-05 DIAGNOSIS — R2689 Other abnormalities of gait and mobility: Secondary | ICD-10-CM

## 2021-05-05 DIAGNOSIS — R278 Other lack of coordination: Secondary | ICD-10-CM

## 2021-05-05 DIAGNOSIS — M6281 Muscle weakness (generalized): Secondary | ICD-10-CM

## 2021-05-05 NOTE — Therapy (Signed)
[facility] [facility], [place], [address] ?Phone: [phone]   Fax:  [phone] ? ?Occupational Therapy Treatment ? ?Patient Details  ?Name: Doris [name] ?MRN: [id number] ?Date of Birth: 1945/09/22 ?Referring Provider (OT): Elizabeth Sauer, [place] ? ? ?Encounter Date: 05/05/2021 ? ? OT End of Session - 05/05/21 1451   ? ? Visit Number 2   ? Number of Visits 7   ? Date for OT Re-Evaluation 06/24/21   ? Authorization Type Medicare & Medicaid   ? Authorization Time Period Follow Medicare Guidelines   ? Progress Note Due on Visit 10   ? OT Start Time 1446   ? OT Stop Time 1530   ? OT Time Calculation (min) 44 min   ? Activity Tolerance Patient tolerated treatment well;Patient limited by pain   ? Behavior During Therapy [facility] for tasks assessed/performed   ? ?  ?  ? ?  ? ? ?Past Medical History:  ?Diagnosis Date  ? Acid reflux   ? Back pain   ? HLD (hyperlipidemia)   ? Hypertension   ? Insomnia   ? Migraine   ? Osteoarthritis   ? knee  ? Psychosis ([place])   ? followed by Dr. Keenan Bachelor [phone]  ? Thyroid nodule 2010  ? TSH 04/2008 = 0.296, no follow up noted in [facility]  ? Vertigo   ? ? ?Past Surgical History:  ?Procedure Laterality Date  ? TONSILLECTOMY    ? UTERINE FIBROID SURGERY    ? ? ?Vitals:  ? 05/05/21 1448 05/05/21 1523  ?BP: (!) 154/100 (!) 164/106  ?Pulse: (!) 102 (!) 103  ? ? ? Subjective Assessment - 05/05/21 1448   ? ? Subjective  "all is well"   ? Patient is accompanied by: --   no one accompaning patient today  ? Pertinent History PMH: HTN, HLD, OA, Hypothyroidism   ? Limitations Fall Risk, Cognitive Deficits   ? Patient Stated Goals "to be able to ues my arm in a comfortable state"   ? Currently in Pain? Yes   ? Pain Score 6    ? Pain Location Arm   ? Pain Orientation Right;Left   ? Pain Descriptors / Indicators Aching;Sore;Sharp;Shooting   ? Pain Type Chronic pain   ? Pain Onset More than a month ago   ? Pain Frequency Constant   ? ?  ?   ? ?  ? ? ?Falls pt reports falling on left side day before evaluation but did not report any pain in LUE at evaluation. OT recommended getting X ray and CT scan possibly - at least getting checked out with MD - d/t continued pain and discomfort and hitting head and arm. Pt declined this recommendation. ? ?Exercises pt repots doing chair yoga at some point but will not be getting started back wit this program until after OT is finished per pt report.  ? ?Supine closed chain HEP see pt instructions ? ?Forward Reaching with physioball x 10, forward reaching with semicircle pegboard with RUE. ? ?Cognition pt with some difficulty following conversation and recall and answering questions correctly today but reports this is an "off day"  ? ?Vitals/Dizziness  pt had several dizzy spells with needing to grab mat to catch self while sitting edge of mat during light reaching. Assessed vitals with blood pressure and heart rate elevated. Advised patient to call someone to come pick her up vs driving home and patient reported she was going to call her neighbor and  was left in lobby to contact neighbor. On the way to lobby, patient had LOB  x 3 with grabbing OT for stability. Pt advised to go to ED and/or contact PCP for managing blood pressure and heart rate d/t elevation and symptomatic today. Pt verbalized understanding. ? ? ? ? ? ? ? ? ? ? ? ? ? ? ? ? ? ? ? ? ? ? OT Education - 05/05/21 1508   ? ? Education Details initiated supine CC HEP   ? Person(s) Educated Patient   ? Methods Explanation;Demonstration;Handout   ? Comprehension Verbalized understanding;Returned demonstration;Need further instruction   ? ?  ?  ? ?  ? ? ? OT Short Term Goals - 05/05/21 1511   ? ?  ? OT SHORT TERM GOAL #1  ? Title Pt will be independent with initial HEP   ? Baseline needs HEP for shoulder ROM   ? Time 4   ? Period Weeks   ? Status On-going   ? Target Date 05/27/21   ?  ? OT SHORT TERM GOAL #2  ? Title Pt will verbalize understanding of pain  management strategies   ? Baseline 6/10 pain at rest in RUE   ? Time 4   ? Period Weeks   ? Status On-going   ? ?  ?  ? ?  ? ? ? ? OT Long Term Goals - 05/05/21 1512   ? ?  ? OT LONG TERM GOAL #1  ? Title Pt will be independent with updated HEP   ? Baseline not issued at eval   ? Time 8   ? Period Weeks   ? Status New   ? Target Date 06/24/21   ?  ? OT LONG TERM GOAL #2  ? Title Pt will verbalize and/or demonstrate sleep positions for increasing comfort and decreasing pain with RUE shoulder and arm.   ? Baseline reports decreased sleep d/t pain   ? Time 8   ? Period Weeks   ? Status On-going   ?  ? OT LONG TERM GOAL #3  ? Title Pt will increase grip strength in BUE by 5 lbs or greater for increasing functional use of BUE.   ? Baseline R 24.9, L 18.5   ? Time 8   ? Period Weeks   ? Status New   ?  ? OT LONG TERM GOAL #4  ? Title Pt will increase range of motion with RUE shoulder flexion by 5 degrees or greater with pain no greater than 5/10 with mid level reach.   ? Baseline 6/10 pain at rest, 110 degrees   ? Time 8   ? Period Weeks   ? Status New   ?  ? OT LONG TERM GOAL #5  ? Title Pt will report pain no greater than 5/10 with completion of HEP   ? Baseline 6/10 at rest   ? Time 8   ? Period Weeks   ? Status New   ? ?  ?  ? ?  ? ? ? ? ? ? ? ? Plan - 05/05/21 1456   ? ? Clinical Impression Statement reviewed goals - verbalized undrestanding but patient is hesitant with how therapy will help with RUE bursitis. Pt also reporting increased pain in LUE d/t a recent fall but has reported not going to go to the doctor for getting it checked.   ? OT Occupational Profile and History Detailed Assessment- Review of Records and additional review of  physical, cognitive, psychosocial history related to current functional performance   ? Occupational performance deficits (Please refer to evaluation for details): IADL's;ADL's;Leisure;Rest and Sleep   ? Body Structure / Function / Physical Skills ADL;Decreased knowledge of use of  DME;Strength;Pain;GMC;UE functional use;ROM;IADL;Sensation;Endurance   ? Cognitive Skills Attention;Memory;Problem Solve   ? Psychosocial Skills Coping Strategies;Environmental  Adaptations   ? Rehab Potential Fair   chronic pain  ? Clinical Decision Making Several treatment options, min-mod task modification necessary   ? Comorbidities Affecting Occupational Performance: May have comorbidities impacting occupational performance   ? Modification or Assistance to Complete Evaluation  Min-Moderate modification of tasks or assist with assess necessary to complete eval   ? OT Frequency 1x / week   ? OT Duration 8 weeks   6 visits over 8 weeks d/t any scheduling conflicts  ? OT Treatment/Interventions Self-care/ADL training;Therapeutic exercise;DME and/or AE instruction;Moist Heat;Electrical Stimulation;Ultrasound;Therapeutic activities;Passive range of motion;Functional Mobility Training;Neuromuscular education;Manual Therapy;Patient/family education;Energy conservation   ? Plan initiate Supine closed chain HEP, low level reaching, ultrasound?   ? Consulted and Agree with Plan of Care Patient   ? ?  ?  ? ?  ? ? ?Patient will benefit from skilled therapeutic intervention in order to improve the following deficits and impairments:   ?Body Structure / Function / Physical Skills: ADL, Decreased knowledge of use of DME, Strength, Pain, GMC, UE functional use, ROM, IADL, Sensation, Endurance ?Cognitive Skills: Attention, Memory, Problem Solve ?Psychosocial Skills: Coping Strategies, Environmental  Adaptations ? ? ?Visit Diagnosis: ?Stiffness of right shoulder, not elsewhere classified ? ?Other abnormalities of gait and mobility ? ?Other lack of coordination ? ?Muscle weakness (generalized) ? ?Pain in right hand ? ?Chronic right shoulder pain ? ? ? ?Problem List ?Patient Active Problem List  ? Diagnosis Date Noted  ? Impaired cognition 05/03/2018  ? Vitamin D deficiency 12/08/2016  ? Overweight 10/27/2016  ? Postablative  hypothyroidism 10/27/2016  ? Prediabetes 10/27/2016  ? Muscle spasms of neck 08/26/2016  ? Abnormality of gait 08/10/2016  ? Osteoporosis 10/13/2015  ? Acrochordon 07/07/2015  ? Jaw pain 12/30/2014  ? Coron

## 2021-05-05 NOTE — Patient Instructions (Signed)
Cranial Flexion: Overhead Arm Extension - Supine ([facility] [place]) ? ? ? ?Lie with knees bent, arms beyond head, holding paper towel roll. Pull roll up to above face. ?Repeat _10_ times per set. Do _3 sets per session. Do _5-7_ sessions per week. ? ?ROM: Horizontal Abduction / Adduction - Wand ? ? ? ?Keeping hands on either side of the paper towel roll. Then pull back across body, in both directions. Do not allow trunk to twist. Hold _5_ seconds. ?Repeat _10_ times per set. Do _3_ sets per session. Do _5-7_ sessions per week. ? ?Supine: Chest Press (Active) ? ? ? ?Lie on back with arms fully extended and hands on either side of the paper towel roll. Lower roll slowly to chest and press to arm's length.  ?Complete _3__ sets of _10__ repetitions. Perform  5-7___ sessions per week ? ? ? ?Copyright ? [employer]. All rights reserved.   ?

## 2021-05-12 ENCOUNTER — Ambulatory Visit: Payer: Medicare Other | Admitting: Occupational Therapy

## 2021-05-12 ENCOUNTER — Encounter: Payer: Self-pay | Admitting: Occupational Therapy

## 2021-05-12 ENCOUNTER — Other Ambulatory Visit: Payer: Self-pay

## 2021-05-12 VITALS — BP 145/83 | HR 90

## 2021-05-12 DIAGNOSIS — M6281 Muscle weakness (generalized): Secondary | ICD-10-CM

## 2021-05-12 DIAGNOSIS — M25611 Stiffness of right shoulder, not elsewhere classified: Secondary | ICD-10-CM

## 2021-05-12 DIAGNOSIS — G8929 Other chronic pain: Secondary | ICD-10-CM

## 2021-05-12 NOTE — Patient Instructions (Signed)
If you feel pulling in your neck and shoulder - tip right ear toward right shoulder. This may relieve shoulder and neck discomfort.   ? ?When sleeping on your back - it may be helpful to place a pillow under your elbow so elbow is higher than top of arm.   ? ?Table slides - slide arms on towel - forward, backward, diagonally and in circles.   ?10x's in each direction at least twice daily.   ? ?

## 2021-05-12 NOTE — Therapy (Signed)
?OUTPATIENT OCCUPATIONAL THERAPY TREATMENT NOTE ? ? ?Patient Name: Doris [name] ?MRN: [id number] ?DOB:09/08/1945, 76 y.o., female ?Today's Date: 05/12/2021 ? ?PCP: Audley Hose, MD ?REFERRING PROVIDER: Audley Hose, MD ? ? OT End of Session - 05/12/21 1535   ? ? Visit Number 3   ? Number of Visits 7   ? Date for OT Re-Evaluation 06/24/21   ? Authorization Type Medicare & Medicaid   ? Authorization Time Period Follow Medicare Guidelines   ? Progress Note Due on Visit 10   ? OT Start Time 4098   ? OT Stop Time 1530   ? OT Time Calculation (min) 45 min   ? Activity Tolerance Patient tolerated treatment well;Patient limited by pain   ? Behavior During Therapy [facility] for tasks assessed/performed   ? ?  ?  ? ?  ? ? ?Past Medical History:  ?Diagnosis Date  ? Acid reflux   ? Back pain   ? HLD (hyperlipidemia)   ? Hypertension   ? Insomnia   ? Migraine   ? Osteoarthritis   ? knee  ? Psychosis ([place])   ? followed by Dr. Keenan Bachelor [phone]  ? Thyroid nodule 2010  ? TSH 04/2008 = 0.296, no follow up noted in [facility]  ? Vertigo   ? ?Past Surgical History:  ?Procedure Laterality Date  ? TONSILLECTOMY    ? UTERINE FIBROID SURGERY    ? ?Patient Active Problem List  ? Diagnosis Date Noted  ? Impaired cognition 05/03/2018  ? Vitamin D deficiency 12/08/2016  ? Overweight 10/27/2016  ? Postablative hypothyroidism 10/27/2016  ? Prediabetes 10/27/2016  ? Muscle spasms of neck 08/26/2016  ? Abnormality of gait 08/10/2016  ? Osteoporosis 10/13/2015  ? Acrochordon 07/07/2015  ? Jaw pain 12/30/2014  ? Coronary artery calcification 12/23/2014  ? Dyspnea 12/23/2014  ? Tachycardia 08/27/2014  ? Cholelithiasis 05/17/2014  ? GERD (gastroesophageal reflux disease) 01/02/2013  ? TB lung, latent 05/01/2012  ? Osteoarthritis 03/01/2012  ? Severe major depression with psychotic features ([place]) 04/01/2011  ? Hypothyroidism (acquired) 03/22/2011  ? Healthcare maintenance 12/28/2010  ? BPPV (benign paroxysmal positional vertigo) 11/19/2008  ?  Hyperlipidemia 11/30/2005  ? Essential hypertension 11/30/2005  ? LOW BACK PAIN 11/30/2005  ? ? ?ONSET DATE: 04/23/21 ? ?REFERRING DIAG: right shoulder pain ? ?THERAPY DIAG:  ?Stiffness of right shoulder, not elsewhere classified ? ?Muscle weakness (generalized) ? ?Chronic right shoulder pain ? ? ?PERTINENT HISTORY: PMH: HTN, HLD, OA, Hypothyroidism  ? ?PRECAUTIONS: fall ? ?SUBJECTIVE: I don't even want to bother with those exercises because of my pressures when I lay down.   ? ?PAIN:  ?Are you having pain? Yes: NPRS scale: 4/10 ?Pain location: r shoulder ?Pain description: stabbing ?Aggravating factors: use/ poor positioning ?Relieving factors: rest, [place] ? ? ? ? ?OBJECTIVE:  ? ?TODAY'S TREATMENT:  ?Patient seen today for right shoulder pain.  Patient indicates that MD called this referred pain and related it to nerve irritation.  Patient reports blood pressure problems when supine - so worked on seated exercise program following Ultrasound.  Patient reported no pain during [place] application to anterior, superior posterior right shoulder.  18mz, pulsed at 20%, 0.8w/cm2, x 8 min.   ? ? ?PATIENT EDUCATION: ?Education details: table slides ?Person educated: Patient ?Education method: Explanation, Demonstration, Tactile cues, Verbal cues, and Handouts ?Education comprehension: verbalized understanding, returned demonstration, and needs further education ? ? ?[place]?See patient instructions - table slides ? ? OT Short Term Goals -   ? ?  ?  OT SHORT TERM GOAL #1  ? Title Pt will be independent with initial HEP   ? Baseline needs HEP for shoulder ROM   ? Time 4   ? Period Weeks   ? Status On-going   ? Target Date 05/27/21   ?  ? OT SHORT TERM GOAL #2  ? Title Pt will verbalize understanding of pain management strategies   ? Baseline 6/10 pain at rest in RUE   ? Time 4   ? Period Weeks   ? Status On-going   ? ?  ?  ? ?  ? ? ? OT Long Term Goals -   ? ?  ? OT LONG TERM GOAL #1  ? Title Pt will be independent with  updated HEP   ? Baseline not issued at eval   ? Time 8   ? Period Weeks   ? Status New   ? Target Date 06/24/21   ?  ? OT LONG TERM GOAL #2  ? Title Pt will verbalize and/or demonstrate sleep positions for increasing comfort and decreasing pain with RUE shoulder and arm.   ? Baseline reports decreased sleep d/t pain   ? Time 8   ? Period Weeks   ? Status On-going   ?  ? OT LONG TERM GOAL #3  ? Title Pt will increase grip strength in BUE by 5 lbs or greater for increasing functional use of BUE.   ? Baseline R 24.9, L 18.5   ? Time 8   ? Period Weeks   ? Status New   ?  ? OT LONG TERM GOAL #4  ? Title Pt will increase range of motion with RUE shoulder flexion by 5 degrees or greater with pain no greater than 5/10 with mid level reach.   ? Baseline 6/10 pain at rest, 110 degrees   ? Time 8   ? Period Weeks   ? Status New   ?  ? OT LONG TERM GOAL #5  ? Title Pt will report pain no greater than 5/10 with completion of HEP   ? Baseline 6/10 at rest   ? Time 8   ? Period Weeks   ? Status New   ? ?  ?  ? ?  ? ? ? Plan - 05/12/21 1536   ? ? Clinical Impression Statement Patient reports inability to do supine exercises due to blood pressure.  Patient reports pain worse at night - anterior / posterior shoulder.   ? OT Occupational Profile and History Detailed Assessment- Review of Records and additional review of physical, cognitive, psychosocial history related to current functional performance   ? Occupational performance deficits (Please refer to evaluation for details): IADL's;ADL's;Leisure;Rest and Sleep   ? Body Structure / Function / Physical Skills ADL;Decreased knowledge of use of DME;Strength;Pain;GMC;UE functional use;ROM;IADL;Sensation;Endurance   ? Cognitive Skills Attention;Memory;Problem Solve   ? Psychosocial Skills Coping Strategies;Environmental  Adaptations   ? Rehab Potential Fair   chronic pain  ? Clinical Decision Making Several treatment options, min-mod task modification necessary   ? Comorbidities  Affecting Occupational Performance: May have comorbidities impacting occupational performance   ? Modification or Assistance to Complete Evaluation  Min-Moderate modification of tasks or assist with assess necessary to complete eval   ? OT Frequency 1x / week   ? OT Duration 8 weeks   6 visits over 8 weeks d/t any scheduling conflicts  ? OT Treatment/Interventions Self-care/ADL training;Therapeutic exercise;DME and/or AE instruction;Moist Heat;Electrical Stimulation;Ultrasound;Therapeutic activities;Passive range of  motion;Functional Mobility Training;Neuromuscular education;Manual Therapy;Patient/family education;Energy conservation   ? Plan [place], or heat - review table slides, maybe closed chain - modified plantigrade at countertop   ? Consulted and Agree with Plan of Care Patient   ? ?  ?  ? ?  ? ? ? ?Mariah Milling, OT ?05/12/2021, 3:39 PM ? ?  ? ?  ?

## 2021-05-19 ENCOUNTER — Ambulatory Visit: Payer: Medicare Other | Attending: Internal Medicine | Admitting: Occupational Therapy

## 2021-05-19 DIAGNOSIS — M25611 Stiffness of right shoulder, not elsewhere classified: Secondary | ICD-10-CM | POA: Diagnosis present

## 2021-05-19 DIAGNOSIS — R278 Other lack of coordination: Secondary | ICD-10-CM | POA: Insufficient documentation

## 2021-05-19 DIAGNOSIS — G8929 Other chronic pain: Secondary | ICD-10-CM | POA: Diagnosis present

## 2021-05-19 DIAGNOSIS — R2689 Other abnormalities of gait and mobility: Secondary | ICD-10-CM | POA: Insufficient documentation

## 2021-05-19 DIAGNOSIS — M25511 Pain in right shoulder: Secondary | ICD-10-CM | POA: Diagnosis present

## 2021-05-19 DIAGNOSIS — M79641 Pain in right hand: Secondary | ICD-10-CM | POA: Diagnosis present

## 2021-05-19 DIAGNOSIS — M6281 Muscle weakness (generalized): Secondary | ICD-10-CM | POA: Diagnosis present

## 2021-05-19 DIAGNOSIS — R2681 Unsteadiness on feet: Secondary | ICD-10-CM | POA: Diagnosis present

## 2021-05-19 NOTE — Therapy (Signed)
?OUTPATIENT OCCUPATIONAL THERAPY TREATMENT NOTE ? ? ?Patient Name: Doris [name] ?MRN: [id number] ?DOB:01/29/1946, 76 y.o., female ?Today's Date: 05/19/2021 ? ?PCP: Audley Hose, MD ?REFERRING PROVIDER: Audley Hose, MD ? ? OT End of Session - 05/19/21 1351   ? ? Visit Number 4   ? Number of Visits 7   ? Date for OT Re-Evaluation 06/24/21   ? Authorization Type Medicare & Medicaid   ? Authorization Time Period Follow Medicare Guidelines   ? Progress Note Due on Visit 10   ? OT Start Time 1310   ? OT Stop Time 1350   ? OT Time Calculation (min) 40 min   ? Activity Tolerance Patient tolerated treatment well;Patient limited by pain   ? Behavior During Therapy [facility] for tasks assessed/performed   ? ?  ?  ? ?  ? ? ? ?Past Medical History:  ?Diagnosis Date  ? Acid reflux   ? Back pain   ? HLD (hyperlipidemia)   ? Hypertension   ? Insomnia   ? Migraine   ? Osteoarthritis   ? knee  ? Psychosis ([place])   ? followed by Dr. Keenan Bachelor [phone]  ? Thyroid nodule 2010  ? TSH 04/2008 = 0.296, no follow up noted in [facility]  ? Vertigo   ? ?Past Surgical History:  ?Procedure Laterality Date  ? TONSILLECTOMY    ? UTERINE FIBROID SURGERY    ? ?Patient Active Problem List  ? Diagnosis Date Noted  ? Impaired cognition 05/03/2018  ? Vitamin D deficiency 12/08/2016  ? Overweight 10/27/2016  ? Postablative hypothyroidism 10/27/2016  ? Prediabetes 10/27/2016  ? Muscle spasms of neck 08/26/2016  ? Abnormality of gait 08/10/2016  ? Osteoporosis 10/13/2015  ? Acrochordon 07/07/2015  ? Jaw pain 12/30/2014  ? Coronary artery calcification 12/23/2014  ? Dyspnea 12/23/2014  ? Tachycardia 08/27/2014  ? Cholelithiasis 05/17/2014  ? GERD (gastroesophageal reflux disease) 01/02/2013  ? TB lung, latent 05/01/2012  ? Osteoarthritis 03/01/2012  ? Severe major depression with psychotic features ([place]) 04/01/2011  ? Hypothyroidism (acquired) 03/22/2011  ? Healthcare maintenance 12/28/2010  ? BPPV (benign paroxysmal positional vertigo) 11/19/2008  ?  Hyperlipidemia 11/30/2005  ? Essential hypertension 11/30/2005  ? LOW BACK PAIN 11/30/2005  ? ? ?ONSET DATE: 04/23/21 ? ?REFERRING DIAG: right shoulder pain ? ?THERAPY DIAG:  ?Stiffness of right shoulder, not elsewhere classified ? ?Muscle weakness (generalized) ? ?Other abnormalities of gait and mobility ? ?Chronic right shoulder pain ? ?Other lack of coordination ? ?Pain in right hand ? ? ?PERTINENT HISTORY: PMH: HTN, HLD, OA, Hypothyroidism  ? ?PRECAUTIONS: fall ? ?SUBJECTIVE: "I took my blood pressure before I came and I don't want to bother with checking it. It was ok.", "I fell again and they're sending me back to physical therapy" ? ?PAIN:  ?Are you having pain? No ?Pt reports significant pain last night while sleeping. Pt also reports starting to sleep walk last night. ? ? ? ? ?OBJECTIVE:  ? ?TODAY'S TREATMENT:  ?  Pt reports elevating the elbow when sleeping helps.  ? ?Ultrasound to anterior, superior posterior right shoulder.  36mz, pulsed at 20%, 0.8w/cm2, x 8 min.  Pt reports no adverse reactions and/or pain during [place] ? ?Seated exercises with dowel rod - x 10 reps with shoulder flexion, chest press and horizontal abduction  ? ? ?PATIENT EDUCATION: ?Education details: seated dowel exercises Access Code: [email or web address] ?Person educated: Patient ?Education method: Explanation, Demonstration, Tactile cues, Verbal cues, and Handouts ?Education comprehension: verbalized understanding, returned demonstration, and  needs further education ? ? ?HOME EXERCISE PROGRAM ?Seated dowel exercises ?Access Code: [email or web address] ?URL: [email or web address] ?Date: 05/19/2021 ?Prepared by: Waldo Laine ? ?Exercises ?- Seated Shoulder Flexion with Dowel to 90  - 1 x daily - 7 x weekly - 3 sets - 10 reps ?- Seated Overhead Press with Bar  - 1 x daily - 7 x weekly - 3 sets - 10 reps ?- Seated Bicep Curls with Bar  - 1 x daily - 7 x weekly - 3 sets - 10 reps ?- Seated Dowel Chest Press  - 1 x daily - 7 x weekly - 3 sets  - 10 reps ?- Seated Dowel Rowing  - 1 x daily - 7 x weekly - 3 sets - 10 reps ?- Seated Dowel Horizontal Abduction/Adduction  - 1 x daily - 7 x weekly - 3 sets - 10 reps ? ? ? OT Short Term Goals -   ? ?  ? OT SHORT TERM GOAL #1  ? Title Pt will be independent with initial HEP   ? Baseline needs HEP for shoulder ROM   ? Time 4   ? Period Weeks   ? Status On-going   ? Target Date 05/27/21   ?  ? OT SHORT TERM GOAL #2  ? Title Pt will verbalize understanding of pain management strategies   ? Baseline 6/10 pain at rest in RUE   ? Time 4   ? Period Weeks   ? Status On-going   ? ?  ?  ? ?  ? ? ? OT Long Term Goals -   ? ?  ? OT LONG TERM GOAL #1  ? Title Pt will be independent with updated HEP   ? Baseline not issued at eval   ? Time 8   ? Period Weeks   ? Status New   ? Target Date 06/24/21   ?  ? OT LONG TERM GOAL #2  ? Title Pt will verbalize and/or demonstrate sleep positions for increasing comfort and decreasing pain with RUE shoulder and arm.   ? Baseline reports decreased sleep d/t pain   ? Time 8   ? Period Weeks   ? Status On-going   ?  ? OT LONG TERM GOAL #3  ? Title Pt will increase grip strength in BUE by 5 lbs or greater for increasing functional use of BUE.   ? Baseline R 24.9, L 18.5   ? Time 8   ? Period Weeks   ? Status New   ?  ? OT LONG TERM GOAL #4  ? Title Pt will increase range of motion with RUE shoulder flexion by 5 degrees or greater with pain no greater than 5/10 with mid level reach.   ? Baseline 6/10 pain at rest, 110 degrees   ? Time 8   ? Period Weeks   ? Status New   ?  ? OT LONG TERM GOAL #5  ? Title Pt will report pain no greater than 5/10 with completion of HEP   ? Baseline 6/10 at rest   ? Time 8   ? Period Weeks   ? Status New   ? ?  ?  ? ?  ? ? ? Plan - 05/19/21 1400   ? ? Clinical Impression Statement Pt continues to report inability to do supine exercises d/t blood pressure elevating and reports difficulty with sleeping and sleep walking with increased RUE shoulder pain.   ? OT  Occupational Profile and History  Detailed Assessment- Review of Records and additional review of physical, cognitive, psychosocial history related to current functional performance   ? Occupational performance deficits (Please refer to evaluation for details): IADL's;ADL's;Leisure;Rest and Sleep   ? Body Structure / Function / Physical Skills ADL;Decreased knowledge of use of DME;Strength;Pain;GMC;UE functional use;ROM;IADL;Sensation;Endurance   ? Cognitive Skills Attention;Memory;Problem Solve   ? Psychosocial Skills Coping Strategies;Environmental  Adaptations   ? Rehab Potential Fair   chronic pain  ? Clinical Decision Making Several treatment options, min-mod task modification necessary   ? Comorbidities Affecting Occupational Performance: May have comorbidities impacting occupational performance   ? Modification or Assistance to Complete Evaluation  Min-Moderate modification of tasks or assist with assess necessary to complete eval   ? OT Frequency 1x / week   ? OT Duration 8 weeks   6 visits over 8 weeks d/t any scheduling conflicts  ? OT Treatment/Interventions Self-care/ADL training;Therapeutic exercise;DME and/or AE instruction;Moist Heat;Electrical Stimulation;Ultrasound;Therapeutic activities;Passive range of motion;Functional Mobility Training;Neuromuscular education;Manual Therapy;Patient/family education;Energy conservation   ? Plan continue ultrasound and HEP review and add PRN   ? Consulted and Agree with Plan of Care Patient   ? ?  ?  ? ?  ? ? ? ? ?Zachery Conch, OT ?05/19/2021, 2:04 PM ? ?  ? ?  ?

## 2021-05-27 ENCOUNTER — Encounter: Payer: Self-pay | Admitting: Occupational Therapy

## 2021-05-27 ENCOUNTER — Ambulatory Visit: Payer: Medicare Other | Admitting: Occupational Therapy

## 2021-05-27 DIAGNOSIS — M6281 Muscle weakness (generalized): Secondary | ICD-10-CM

## 2021-05-27 DIAGNOSIS — M25611 Stiffness of right shoulder, not elsewhere classified: Secondary | ICD-10-CM

## 2021-05-27 DIAGNOSIS — M79641 Pain in right hand: Secondary | ICD-10-CM

## 2021-05-27 DIAGNOSIS — G8929 Other chronic pain: Secondary | ICD-10-CM

## 2021-05-27 DIAGNOSIS — R2681 Unsteadiness on feet: Secondary | ICD-10-CM

## 2021-05-27 DIAGNOSIS — R278 Other lack of coordination: Secondary | ICD-10-CM

## 2021-05-27 NOTE — Therapy (Signed)
?OUTPATIENT OCCUPATIONAL THERAPY TREATMENT NOTE  ? ? ?Patient Name: Doris [name] ?MRN: [id number] ?DOB:09-19-1945, 76 y.o., female ?Today's Date: 05/27/2021 ? ?PCP: Audley Hose, MD ?REFERRING PROVIDER: Audley Hose, MD ? ? OT End of Session - 05/27/21 1243   ? ? Visit Number 6   ? Number of Visits 7   ? Date for OT Re-Evaluation 06/24/21   ? Authorization Type Medicare & Medicaid   ? Authorization Time Period Follow Medicare Guidelines   ? Progress Note Due on Visit 10   ? OT Start Time 1230   ? OT Stop Time 1310   ? OT Time Calculation (min) 40 min   ? Activity Tolerance Patient tolerated treatment well   ? Behavior During Therapy [facility] for tasks assessed/performed   ? ?  ?  ? ?  ? ? ? ?Past Medical History:  ?Diagnosis Date  ? Acid reflux   ? Back pain   ? HLD (hyperlipidemia)   ? Hypertension   ? Insomnia   ? Migraine   ? Osteoarthritis   ? knee  ? Psychosis ([place])   ? followed by Dr. Keenan Bachelor [phone]  ? Thyroid nodule 2010  ? TSH 04/2008 = 0.296, no follow up noted in [facility]  ? Vertigo   ? ?Past Surgical History:  ?Procedure Laterality Date  ? TONSILLECTOMY    ? UTERINE FIBROID SURGERY    ? ?Patient Active Problem List  ? Diagnosis Date Noted  ? Impaired cognition 05/03/2018  ? Vitamin D deficiency 12/08/2016  ? Overweight 10/27/2016  ? Postablative hypothyroidism 10/27/2016  ? Prediabetes 10/27/2016  ? Muscle spasms of neck 08/26/2016  ? Abnormality of gait 08/10/2016  ? Osteoporosis 10/13/2015  ? Acrochordon 07/07/2015  ? Jaw pain 12/30/2014  ? Coronary artery calcification 12/23/2014  ? Dyspnea 12/23/2014  ? Tachycardia 08/27/2014  ? Cholelithiasis 05/17/2014  ? GERD (gastroesophageal reflux disease) 01/02/2013  ? TB lung, latent 05/01/2012  ? Osteoarthritis 03/01/2012  ? Severe major depression with psychotic features ([place]) 04/01/2011  ? Hypothyroidism (acquired) 03/22/2011  ? Healthcare maintenance 12/28/2010  ? BPPV (benign paroxysmal positional vertigo) 11/19/2008  ? Hyperlipidemia  11/30/2005  ? Essential hypertension 11/30/2005  ? LOW BACK PAIN 11/30/2005  ? ? ?ONSET DATE: 04/23/21 ? ?REFERRING DIAG: right shoulder pain ? ?THERAPY DIAG:  ?Stiffness of right shoulder, not elsewhere classified ? ?Muscle weakness (generalized) ? ?Chronic right shoulder pain ? ?Other lack of coordination ? ?Pain in right hand ? ?Unsteadiness on feet ? ? ?PERTINENT HISTORY: PMH: HTN, HLD, OA, Hypothyroidism  ? ?PRECAUTIONS: fall ? ?SUBJECTIVE: "I took my blood pressure before I came and I don't want to bother with checking it. It was ok.", "I fell again and they're sending me back to physical therapy" ? ?PAIN:  ?Are you having pain? No ?Pt reports significant pain last night while sleeping. Pt also reports starting to sleep walk last night. ? ? ? ? ?OBJECTIVE:  ? ?TODAY'S TREATMENT:  ? 05/27/21:   ? Patient saw MD last week and has MRI of right shoulder scheduled for Sunday 4/16.   ? Patient having difficulty classifying pain, but basically indicating the pain is "changing"  or   intensifying.  Patient is moving right arm less - as she has more pain with movement.   ? ?Seated weight shift :  Discussed the importance of gentle motion that does not provoke symptoms.  Did seated arms stable on table.  Weight shifting through pelvis - anterior / posterior for body on arm  movement.  Patient reports no pain with that movement.    ? ?Ultrasound to anterior, superior posterior right shoulder.  2mz, continuous, 0.8w/cm2, x 124m.  Pt reports no adverse reactions and/or pain during [place] Patient reported pain 5-6 at beginning of session - had no pain following ultrasound.   ? ? ? ?05/19/21:  Pt reports elevating the elbow when sleeping helps.  ? ?Ultrasound to anterior, superior posterior right shoulder.  73m68m pulsed at 20%, 0.8w/cm2, x 8 min.  Pt reports no adverse reactions and/or pain during [place] ?Seated exercises with dowel rod - x 10 reps with shoulder flexion, chest press and horizontal abduction  ? ? ?PATIENT  EDUCATION: ?Education details: 4/12- body on arm table top exercises  4/4 seated dowel exercises Access Code: [email or web address] ?Person educated: Patient ?Education method: Explanation, Demonstration, Tactile cues, Verbal cues, and Handouts ?Education comprehension: verbalized understanding, returned demonstration, and needs further education ? ? ?[place]OM[place]reated dowel exercises ?Access Code: [email or web address] ?URL: [email or web address] ?Date: 05/19/2021 ?Prepared by: KirWaldo Laine?Exercises ?- Seated Shoulder Flexion with Dowel to 90  - 1 x daily - 7 x weekly - 3 sets - 10 reps ?- Seated Overhead Press with Bar  - 1 x daily - 7 x weekly - 3 sets - 10 reps ?- Seated Bicep Curls with Bar  - 1 x daily - 7 x weekly - 3 sets - 10 reps ?- Seated Dowel Chest Press  - 1 x daily - 7 x weekly - 3 sets - 10 reps ?- Seated Dowel Rowing  - 1 x daily - 7 x weekly - 3 sets - 10 reps ?- Seated Dowel Horizontal Abduction/Adduction  - 1 x daily - 7 x weekly - 3 sets - 10 reps ? ? ? OT Short Term Goals -   ? ?  ? OT SHORT TERM GOAL #1  ? Title Pt will be independent with initial HEP   ? Baseline needs HEP for shoulder ROM   ? Time 4   ? Period Weeks   ? Status On-going  - not met-continue goal  ? Target Date 05/27/21   ?  ? OT SHORT TERM GOAL #2  ? Title Pt will verbalize understanding of pain management strategies   ? Baseline 6/10 pain at rest in RUE   ? Time 4   ? Period Weeks   ? Status On-going - has relief with ice, still having significant pain.    ? ?  ?  ? ?  ? ? ? OT Long Term Goals -   ? ?  ? OT LONG TERM GOAL #1  ? Title Pt will be independent with updated HEP   ? Baseline not issued at eval   ? Time 8   ? Period Weeks   ? Status Ongoing  ? Target Date 06/24/21   ?  ? OT LONG TERM GOAL #2  ? Title Pt will verbalize and/or demonstrate sleep positions for increasing comfort and decreasing pain with RUE shoulder and arm.   ? Baseline reports decreased sleep d/t pain   ? Time 8   ? Period Weeks   ? Status  Achieved  ?  ? OT LONG TERM GOAL #3  ? Title Pt will increase grip strength in BUE by 5 lbs or greater for increasing functional use of BUE.   ? Baseline R 24.9, L 18.5   ? Time 8   ? Period Weeks   ? Status Ongoing  ?  ?  OT LONG TERM GOAL #4  ? Title Pt will increase range of motion with RUE shoulder flexion by 5 degrees or greater with pain no greater than 5/10 with mid level reach.   ? Baseline 6/10 pain at rest, 110 degrees   ? Time 8   ? Period Weeks   ? Status Ongoing  ?  ? OT LONG TERM GOAL #5  ? Title Pt will report pain no greater than 5/10 with completion of HEP   ? Baseline 6/10 at rest   ? Time 8   ? Period Weeks   ? Status Ongoing  ? ?  ?  ? ?  ? ? ? Plan - 0  ? ? Clinical Impression Statement Pt reports decreased pain after ultrasound treatment, but also reports pain is intensifying from session to session.  Patient is having MRI this weekend to hep determine cause of pain.    ? OT Occupational Profile and History Detailed Assessment- Review of Records and additional review of physical, cognitive, psychosocial history related to current functional performance   ? Occupational performance deficits (Please refer to evaluation for details): IADL's;ADL's;Leisure;Rest and Sleep   ? Body Structure / Function / Physical Skills ADL;Decreased knowledge of use of DME;Strength;Pain;GMC;UE functional use;ROM;IADL;Sensation;Endurance   ? Cognitive Skills Attention;Memory;Problem Solve   ? Psychosocial Skills Coping Strategies;Environmental  Adaptations   ? Rehab Potential Fair   chronic pain  ? Clinical Decision Making Several treatment options, min-mod task modification necessary   ? Comorbidities Affecting Occupational Performance: May have comorbidities impacting occupational performance   ? Modification or Assistance to Complete Evaluation  Min-Moderate modification of tasks or assist with assess necessary to complete eval   ? OT Frequency 1x / week   ? OT Duration 8 weeks   6 visits over 8 weeks d/t any  scheduling conflicts  ? OT Treatment/Interventions Self-care/ADL training;Therapeutic exercise;DME and/or AE instruction;Moist Heat;Electrical Stimulation;Ultrasound;Therapeutic activities;Passive range of motion;Funct

## 2021-05-29 ENCOUNTER — Other Ambulatory Visit: Payer: Self-pay | Admitting: Cardiology

## 2021-06-03 ENCOUNTER — Ambulatory Visit: Payer: Medicare Other | Admitting: Occupational Therapy

## 2021-06-03 DIAGNOSIS — G8929 Other chronic pain: Secondary | ICD-10-CM

## 2021-06-03 DIAGNOSIS — M25611 Stiffness of right shoulder, not elsewhere classified: Secondary | ICD-10-CM

## 2021-06-03 DIAGNOSIS — R2689 Other abnormalities of gait and mobility: Secondary | ICD-10-CM

## 2021-06-03 DIAGNOSIS — R278 Other lack of coordination: Secondary | ICD-10-CM

## 2021-06-03 DIAGNOSIS — M79641 Pain in right hand: Secondary | ICD-10-CM

## 2021-06-03 DIAGNOSIS — R2681 Unsteadiness on feet: Secondary | ICD-10-CM

## 2021-06-03 DIAGNOSIS — M6281 Muscle weakness (generalized): Secondary | ICD-10-CM

## 2021-06-03 NOTE — Therapy (Signed)
?OUTPATIENT OCCUPATIONAL THERAPY TREATMENT NOTE  ? ? ?Patient Name: Doris [name] ?MRN: [id number] ?DOB:01-Feb-1946, 76 y.o., female ?Today's Date: 06/03/2021 ? ?PCP: Audley Hose, MD ?REFERRING PROVIDER: Audley Hose, MD ? ? OT End of Session - 06/03/21 1318   ? ? Visit Number 6   6 as mistake last visit  ? Number of Visits 7   ? Date for OT Re-Evaluation 06/24/21   ? Authorization Type Medicare & Medicaid   ? Authorization Time Period Follow Medicare Guidelines   ? Progress Note Due on Visit 10   ? OT Start Time 1317   ? OT Stop Time 1355   ? OT Time Calculation (min) 38 min   ? Activity Tolerance Patient tolerated treatment well   ? Behavior During Therapy [facility] for tasks assessed/performed   ? ?  ?  ? ?  ? ? ? ?Past Medical History:  ?Diagnosis Date  ? Acid reflux   ? Back pain   ? HLD (hyperlipidemia)   ? Hypertension   ? Insomnia   ? Migraine   ? Osteoarthritis   ? knee  ? Psychosis ([place])   ? followed by Dr. Keenan Bachelor [phone]  ? Thyroid nodule 2010  ? TSH 04/2008 = 0.296, no follow up noted in [facility]  ? Vertigo   ? ?Past Surgical History:  ?Procedure Laterality Date  ? TONSILLECTOMY    ? UTERINE FIBROID SURGERY    ? ?Patient Active Problem List  ? Diagnosis Date Noted  ? Impaired cognition 05/03/2018  ? Vitamin D deficiency 12/08/2016  ? Overweight 10/27/2016  ? Postablative hypothyroidism 10/27/2016  ? Prediabetes 10/27/2016  ? Muscle spasms of neck 08/26/2016  ? Abnormality of gait 08/10/2016  ? Osteoporosis 10/13/2015  ? Acrochordon 07/07/2015  ? Jaw pain 12/30/2014  ? Coronary artery calcification 12/23/2014  ? Dyspnea 12/23/2014  ? Tachycardia 08/27/2014  ? Cholelithiasis 05/17/2014  ? GERD (gastroesophageal reflux disease) 01/02/2013  ? TB lung, latent 05/01/2012  ? Osteoarthritis 03/01/2012  ? Severe major depression with psychotic features ([place]) 04/01/2011  ? Hypothyroidism (acquired) 03/22/2011  ? Healthcare maintenance 12/28/2010  ? BPPV (benign paroxysmal positional vertigo) 11/19/2008   ? Hyperlipidemia 11/30/2005  ? Essential hypertension 11/30/2005  ? LOW BACK PAIN 11/30/2005  ? ? ?ONSET DATE: 04/23/21 ? ?REFERRING DIAG: right shoulder pain ? ?THERAPY DIAG:  ?Stiffness of right shoulder, not elsewhere classified ? ?Muscle weakness (generalized) ? ?Chronic right shoulder pain ? ?Other lack of coordination ? ?Pain in right hand ? ?Unsteadiness on feet ? ?Other abnormalities of gait and mobility ? ? ?PERTINENT HISTORY: PMH: HTN, HLD, OA, Hypothyroidism  ? ?PRECAUTIONS: fall ? ?SUBJECTIVE: Pt reports it's not getting worse. "I'm pretty sure it's ok (blood pressure) - I forgot to check it" ? ?PAIN:  ?Are you having pain? No ?Pt continues to report significant pain at night. ? ? ? ? ?OBJECTIVE:  ? ?TODAY'S TREATMENT:  ? ?06/03/21 ?Pt reports getting MRI on Sunday but has not received the results. OT could not see the results on Epic today. Pt reports son got her alarm pads for the recliner and the bed for sleep walking and it is working well.  ? ?Pt reports the ultrasound is helping each time. Will continue today.  ? ?Ultrasound to anterior, superior posterior right shoulder.  59mz, continuous, 0.8w/cm2, x 179m.  Pt reports no adverse reactions and/or pain during [place] relaxing.  ? ?Resistance Clothespins 1-8# with RUE for low functional reaching and sustained pinch.  Pt req'd min cues for movement pattern and maintaining good positioning of RUE with reach. ? ? ? ? ? ? OT Short Term Goals -   ? ?  ? OT SHORT TERM GOAL #1  ? Title Pt will be independent with initial HEP   ? Baseline needs HEP for shoulder ROM   ? Time 4   ? Period Weeks   ? Status Not Met - pt not consistently doing exercises at home  ? Target Date 05/27/21   ?  ? OT SHORT TERM GOAL #2  ? Title Pt will verbalize understanding of pain management strategies   ? Baseline 6/10 pain at rest in RUE   ? Time 4   ? Period Weeks   ? Status Achieved - has relief with ice, still having significant pain.    ? ?  ?  ? ?  ? ? ? OT  Long Term Goals -   ? ?  ? OT LONG TERM GOAL #1  ? Title Pt will be independent with updated HEP   ? Baseline not issued at eval   ? Time 8   ? Period Weeks   ? Status Ongoing  ? Target Date 06/24/21   ?  ? OT LONG TERM GOAL #2  ? Title Pt will verbalize and/or demonstrate sleep positions for increasing comfort and decreasing pain with RUE shoulder and arm.   ? Baseline reports decreased sleep d/t pain   ? Time 8   ? Period Weeks   ? Status Achieved  ?  ? OT LONG TERM GOAL #3  ? Title Pt will increase grip strength in BUE by 5 lbs or greater for increasing functional use of BUE.   ? Baseline R 24.9, L 18.5   ? Time 8   ? Period Weeks   ? Status Achieved R 29.9, L 35.4  ?  ? OT LONG TERM GOAL #4  ? Title Pt will increase range of motion with RUE shoulder flexion by 5 degrees or greater with pain no greater than 5/10 with mid level reach.   ? Baseline 6/10 pain at rest, 110 degrees   ? Time 8   ? Period Weeks   ? Status Ongoing 112 degrees with 5-6/10 pain reported  ?  ? OT LONG TERM GOAL #5  ? Title Pt will report pain no greater than 5/10 with completion of HEP   ? Baseline 6/10 at rest   ? Time 8   ? Period Weeks   ? Status Ongoing - not consistently doing HEP but reports it makes the pain worse  ? ?  ?  ? ?  ? ? ? Plan - 0  ? ? Clinical Impression Statement Pt reports continued pain in RUE shoulder. Pt reports relief with ultrasound however is still consistent with pain reports. Pt is waiting on MRI results and OT anticipates d/c at end of scheduled visits.   ? OT Occupational Profile and History Detailed Assessment- Review of Records and additional review of physical, cognitive, psychosocial history related to current functional performance   ? Occupational performance deficits (Please refer to evaluation for details): IADL's;ADL's;Leisure;Rest and Sleep   ? Body Structure / Function / Physical Skills ADL;Decreased knowledge of use of DME;Strength;Pain;GMC;UE functional use;ROM;IADL;Sensation;Endurance   ?  Cognitive Skills Attention;Memory;Problem Solve   ? Psychosocial Skills Coping Strategies;Environmental  Adaptations   ? Rehab Potential Fair   chronic pain  ? Clinical Decision Making Several treatment options, min-mod task modification necessary   ?  Comorbidities Affecting Occupational Performance: May have comorbidities impacting occupational performance   ? Modification or Assistance to Complete Evaluation  Min-Moderate modification of tasks or assist with assess necessary to complete eval   ? OT Frequency 1x / week   ? OT Duration 8 weeks   6 visits over 8 weeks d/t any scheduling conflicts  ? OT Treatment/Interventions Self-care/ADL training;Therapeutic exercise;DME and/or AE instruction;Moist Heat;Electrical Stimulation;Ultrasound;Therapeutic activities;Passive range of motion;Functional Mobility Training;Neuromuscular education;Manual Therapy;Patient/family education;Energy conservation   ? Plan continue ultrasound and HEP review and add PRN, anticipate d/c at end of scheduled visits on 06/10/21  ? Consulted and Agree with Plan of Care Patient   ? ?  ?  ? ?  ? ? ? ? ?Zachery Conch, OT ?06/03/2021, 1:52 PM ? ?  ? ?  ?

## 2021-06-04 ENCOUNTER — Other Ambulatory Visit: Payer: Self-pay | Admitting: Cardiology

## 2021-06-10 ENCOUNTER — Ambulatory Visit: Payer: Medicare Other | Admitting: Occupational Therapy

## 2021-06-10 ENCOUNTER — Encounter: Payer: Self-pay | Admitting: Occupational Therapy

## 2021-06-10 DIAGNOSIS — R278 Other lack of coordination: Secondary | ICD-10-CM

## 2021-06-10 DIAGNOSIS — M79641 Pain in right hand: Secondary | ICD-10-CM

## 2021-06-10 DIAGNOSIS — M25611 Stiffness of right shoulder, not elsewhere classified: Secondary | ICD-10-CM

## 2021-06-10 DIAGNOSIS — M6281 Muscle weakness (generalized): Secondary | ICD-10-CM

## 2021-06-10 DIAGNOSIS — G8929 Other chronic pain: Secondary | ICD-10-CM

## 2021-06-10 NOTE — Therapy (Signed)
?OUTPATIENT OCCUPATIONAL THERAPY TREATMENT AND DISCHARGE NOTE  ? ? ?Patient Name: Doris [name] ?MRN: [id number] ?DOB:06/03/1945, 76 y.o., female ?Today's Date: 06/10/2021 ? ?PCP: Audley Hose, MD ?REFERRING PROVIDER: Audley Hose, MD ? ? OT End of Session - 06/10/21 1658   ? ? Visit Number 7   ? Number of Visits 7   ? Date for OT Re-Evaluation 06/24/21   ? Authorization Type Medicare & Medicaid   ? Authorization Time Period Follow Medicare Guidelines   ? Progress Note Due on Visit 10   ? OT Start Time 1403   ? OT Stop Time 4098   ? OT Time Calculation (min) 42 min   ? Activity Tolerance Patient tolerated treatment well   ? ?  ?  ? ?  ? ? ? ?Past Medical History:  ?Diagnosis Date  ? Acid reflux   ? Back pain   ? HLD (hyperlipidemia)   ? Hypertension   ? Insomnia   ? Migraine   ? Osteoarthritis   ? knee  ? Psychosis ([place])   ? followed by Dr. Keenan Bachelor [phone]  ? Thyroid nodule 2010  ? TSH 04/2008 = 0.296, no follow up noted in [facility]  ? Vertigo   ? ?Past Surgical History:  ?Procedure Laterality Date  ? TONSILLECTOMY    ? UTERINE FIBROID SURGERY    ? ?Patient Active Problem List  ? Diagnosis Date Noted  ? Impaired cognition 05/03/2018  ? Vitamin D deficiency 12/08/2016  ? Overweight 10/27/2016  ? Postablative hypothyroidism 10/27/2016  ? Prediabetes 10/27/2016  ? Muscle spasms of neck 08/26/2016  ? Abnormality of gait 08/10/2016  ? Osteoporosis 10/13/2015  ? Acrochordon 07/07/2015  ? Jaw pain 12/30/2014  ? Coronary artery calcification 12/23/2014  ? Dyspnea 12/23/2014  ? Tachycardia 08/27/2014  ? Cholelithiasis 05/17/2014  ? GERD (gastroesophageal reflux disease) 01/02/2013  ? TB lung, latent 05/01/2012  ? Osteoarthritis 03/01/2012  ? Severe major depression with psychotic features ([place]) 04/01/2011  ? Hypothyroidism (acquired) 03/22/2011  ? Healthcare maintenance 12/28/2010  ? BPPV (benign paroxysmal positional vertigo) 11/19/2008  ? Hyperlipidemia 11/30/2005  ? Essential hypertension 11/30/2005  ? LOW  BACK PAIN 11/30/2005  ? ? ?ONSET DATE: 04/23/21 ? ?REFERRING DIAG: right shoulder pain ? ?THERAPY DIAG:  ?Stiffness of right shoulder, not elsewhere classified ? ?Muscle weakness (generalized) ? ?Chronic right shoulder pain ? ?Other lack of coordination ? ?Pain in right hand ? ? ?PERTINENT HISTORY: PMH: HTN, HLD, OA, Hypothyroidism  ? ?PRECAUTIONS: fall ? ?SUBJECTIVE: I don't know what I did but it's been hurting again. ?PAIN:  ?Are you having pain? Yes - 6/10 right shoulder ?Pt continues to report significant pain at night. ? ? ? ? ?OBJECTIVE:  ? ?TODAY'S TREATMENT:  ?06/10/21: ?Patient expressing frustration at not being able to hear of MRI results until May.   ?Patient with increase in shoulder pain - unable to determine any cause.  Patient given heat pack for shoulder and this helped to reduce pain.   ?Ultrasound nterior, superior posterior right shoulder.  74mz, continuous, 0.8w/cm2, x 165m.  Pt reports no adverse reactions and/or pain during [place]  ?Exercise:  Reviewed gentle body on arm motion which patient can replicate in clinic with no pain.   ? ?06/03/21 ?Pt reports getting MRI on Sunday but has not received the results. OT could not see the results on Epic today. Pt reports son got her alarm pads for the recliner and the bed for sleep walking and it is working well.  ? ?  Pt reports the ultrasound is helping each time. Will continue today.  ? ?Ultrasound to anterior, superior posterior right shoulder.  33mz, continuous, 0.8w/cm2, x 139m.  Pt reports no adverse reactions and/or pain during [place] reports the [place] relaxing.  ? ?Resistance Clothespins 1-8# with RUE for low functional reaching and sustained pinch. Pt req'd min cues for movement pattern and maintaining good positioning of RUE with reach. ? ? ? ? ? ? OT Short Term Goals -   ? ?  ? OT SHORT TERM GOAL #1  ? Title Pt will be independent with initial HEP   ? Baseline needs HEP for shoulder ROM   ? Time 4   ? Period Weeks   ? Status Not Met - pt not  consistently doing exercises at home  ? Target Date 05/27/21   ?  ? OT SHORT TERM GOAL #2  ? Title Pt will verbalize understanding of pain management strategies   ? Baseline 6/10 pain at rest in RUE   ? Time 4   ? Period Weeks   ? Status Achieved - has relief with ice, still having significant pain.    ? ?  ?  ? ?  ? ? ? OT Long Term Goals -   ? ?  ? OT LONG TERM GOAL #1  ? Title Pt will be independent with updated HEP   ? Baseline not issued at eval   ? Time 8   ? Period Weeks   ? Status Not met  ? Target Date 06/24/21   ?  ? OT LONG TERM GOAL #2  ? Title Pt will verbalize and/or demonstrate sleep positions for increasing comfort and decreasing pain with RUE shoulder and arm.   ? Baseline reports decreased sleep d/t pain   ? Time 8   ? Period Weeks   ? Status Achieved  ?  ? OT LONG TERM GOAL #3  ? Title Pt will increase grip strength in BUE by 5 lbs or greater for increasing functional use of BUE.   ? Baseline R 24.9, L 18.5   ? Time 8   ? Period Weeks   ? Status Achieved R 29.9, L 35.4  ?  ? OT LONG TERM GOAL #4  ? Title Pt will increase range of motion with RUE shoulder flexion by 5 degrees or greater with pain no greater than 5/10 with mid level reach.   ? Baseline 6/10 pain at rest, 110 degrees   ? Time 8   ? Period Weeks   ? Status Ongoing 112 degrees with 5-6/10 pain reported  ?  ? OT LONG TERM GOAL #5  ? Title Pt will report pain no greater than 5/10 with completion of HEP   ? Baseline 6/10 at rest   ? Time 8   ? Period Weeks   ? Status Ongoing - not consistently doing HEP but reports it makes the pain worse  ? ?  ?  ? ?  ? ? ? Plan - 0  ? ? Clinical Impression Statement Pt has had limited benefit from OT intervention.  Patient has received MRI and should view results early next month.    ? OT Occupational Profile and History Detailed Assessment- Review of Records and additional review of physical, cognitive, psychosocial history related to current functional performance   ? Occupational performance deficits  (Please refer to evaluation for details): IADL's;ADL's;Leisure;Rest and Sleep   ? Body Structure / Function / Physical Skills ADL;Decreased knowledge  of use of DME;Strength;Pain;GMC;UE functional use;ROM;IADL;Sensation;Endurance   ? Cognitive Skills Attention;Memory;Problem Solve   ? Psychosocial Skills Coping Strategies;Environmental  Adaptations   ? Rehab Potential Fair   chronic pain  ? Clinical Decision Making Several treatment options, min-mod task modification necessary   ? Comorbidities Affecting Occupational Performance: May have comorbidities impacting occupational performance   ? Modification or Assistance to Complete Evaluation  Min-Moderate modification of tasks or assist with assess necessary to complete eval   ? OT Frequency 1x / week   ? OT Duration 8 weeks   6 visits over 8 weeks d/t any scheduling conflicts  ? OT Treatment/Interventions Self-care/ADL training;Therapeutic exercise;DME and/or AE instruction;Moist Heat;Electrical Stimulation;Ultrasound;Therapeutic activities;Passive range of motion;Functional Mobility Training;Neuromuscular education;Manual Therapy;Patient/family education;Energy conservation   ? Plan discharge  ? Consulted and Agree with Plan of Care Patient   ? ?  ?  ? ?  ? ? ? ? ?Mariah Milling, OT ?06/10/2021, 4:59 PM ? ?  ? ?  ?

## 2021-06-16 ENCOUNTER — Ambulatory Visit: Payer: Medicare Other | Admitting: Psychology

## 2021-06-18 ENCOUNTER — Ambulatory Visit: Payer: Medicare Other | Admitting: Psychology

## 2021-06-23 ENCOUNTER — Telehealth: Payer: Self-pay

## 2021-06-23 NOTE — Telephone Encounter (Signed)
Dr. Marlou Porch to review, patient was seen in the office around 1.5 months ago, at which time she was doing well.  She has a history of coronary artery calcification.  Myoview in November 2016 was normal.  Echocardiogram in May 2021 showed normal EF 50 to 55%, grade 1 DD, trivial MR. ? ?Can the patient proceed with shoulder repair? ? ?Please forward your response to P [facility] PREOP ?

## 2021-06-23 NOTE — Telephone Encounter (Signed)
? ?  Pre-operative Risk Assessment  ?  ?Patient Name: Doris [name]  ?DOB: February 03, 1946 ?MRN: [id number]  ? ?  ? ?Request for Surgical Clearance   ? ?Procedure:   RIGHT SHOULDER SCOPE SAD, DCR, RCR ? ?Date of Surgery:  Clearance TBD                              ?   ?Surgeon:  DR. Beryle Lathe COLLINS ?Surgeon's Group or Practice Name:  [facility] ?Phone number:  [phone] ?Fax number:  [phone] ?  ?Type of Clearance Requested:   ?- Medical  ?  ?Type of Anesthesia:  General  WITH INTERSCALENE BLOCK ?  ?Additional requests/questions:   ? ?Signed, ?Jacinta Shoe   ?06/23/2021, 11:39 AM  ? ?

## 2021-06-25 NOTE — Telephone Encounter (Signed)
? ?  Primary Cardiologist: Candee Furbish, MD ? ?Chart reviewed as part of pre-operative protocol coverage. Given past medical history and time since last visit, based on ACC/AHA guidelines, Leni Pankonin would be at acceptable risk for the planned procedure without further cardiovascular testing.  ? ?Her RCRI is a class III risk, 6.6% risk of major cardiac event. ? ?I will route this recommendation to the requesting party via Epic fax function and remove from pre-op pool. ? ?Please call with questions. ? ?Jossie Ng. Ashika Apuzzo NP-C ? ?  ?06/25/2021, 12:57 PM ?[facility] ?[place] ?Office [phone] Fax [phone] ? ? ? ? ?

## 2021-11-06 ENCOUNTER — Emergency Department (HOSPITAL_COMMUNITY): Payer: Medicare Other

## 2021-11-06 ENCOUNTER — Other Ambulatory Visit: Payer: Self-pay

## 2021-11-06 ENCOUNTER — Encounter (HOSPITAL_COMMUNITY): Payer: Self-pay

## 2021-11-06 ENCOUNTER — Emergency Department (HOSPITAL_COMMUNITY)
Admission: EM | Admit: 2021-11-06 | Discharge: 2021-11-06 | Disposition: A | Discharge status: Home / Self Care | Payer: Medicare Other | Attending: Emergency Medicine | Admitting: Emergency Medicine

## 2021-11-06 DIAGNOSIS — Z79899 Other long term (current) drug therapy: Secondary | ICD-10-CM | POA: Insufficient documentation

## 2021-11-06 DIAGNOSIS — S2231XA Fracture of one rib, right side, initial encounter for closed fracture: Secondary | ICD-10-CM | POA: Insufficient documentation

## 2021-11-06 DIAGNOSIS — S065XAA Traumatic subdural hemorrhage with loss of consciousness status unknown, initial encounter: Secondary | ICD-10-CM

## 2021-11-06 DIAGNOSIS — S0083XA Contusion of other part of head, initial encounter: Secondary | ICD-10-CM | POA: Diagnosis not present

## 2021-11-06 DIAGNOSIS — I10 Essential (primary) hypertension: Secondary | ICD-10-CM | POA: Diagnosis not present

## 2021-11-06 DIAGNOSIS — S065X0A Traumatic subdural hemorrhage without loss of consciousness, initial encounter: Secondary | ICD-10-CM | POA: Diagnosis not present

## 2021-11-06 DIAGNOSIS — W19XXXA Unspecified fall, initial encounter: Secondary | ICD-10-CM

## 2021-11-06 DIAGNOSIS — W01198A Fall on same level from slipping, tripping and stumbling with subsequent striking against other object, initial encounter: Secondary | ICD-10-CM | POA: Insufficient documentation

## 2021-11-06 DIAGNOSIS — S2232XA Fracture of one rib, left side, initial encounter for closed fracture: Secondary | ICD-10-CM

## 2021-11-06 DIAGNOSIS — S0990XA Unspecified injury of head, initial encounter: Secondary | ICD-10-CM | POA: Diagnosis present

## 2021-11-06 MED ORDER — HYDROCODONE-ACETAMINOPHEN 5-325 MG PO TABS: 1.0000 | ORAL_TABLET | Freq: Four times a day (QID) | ORAL | 0 refills | Status: DC | PRN

## 2021-11-06 MED ORDER — HYDROCODONE-ACETAMINOPHEN 5-325 MG PO TABS
1.0000 | ORAL_TABLET | Freq: Once | ORAL | Status: AC
  Administered 2021-11-06: 1 via ORAL
  Filled 2021-11-06: qty 1

## 2021-11-06 MED ORDER — OXYCODONE-ACETAMINOPHEN 5-325 MG PO TABS
1.0000 | ORAL_TABLET | Freq: Once | ORAL | Status: AC
  Administered 2021-11-06: 1 via ORAL
  Filled 2021-11-06: qty 1

## 2021-11-06 NOTE — ED Provider Notes (Signed)
[facility] DEPT Provider Note   CSN: [id number] Arrival date & time: 11/06/21  7673     History  Chief Complaint  Patient presents with   Lytle Michaels    Doris [name] is a 76 y.o. female.  Patient presents to the hospital via EMS complaining of headache and left sided facial swelling after a fall.  The patient states she got up at 7 AM this morning to turn off an outdoor light and to unplug a night light.  While returning to her bedroom she states that she tripped over a book which was in the floor.  When she fell she states she had the left side of her face/head on the concrete floor.  She denies losing consciousness.  She denies taking blood thinners.  She states that since the fall she has had a headache and some swelling to the left side of her face.  She does endorse putting ice pack over that area and states the swelling has subsided some.  Past medical history significant for vertigo, previous concussion, hypertension, insomnia, migraines, hyperlipidemia, osteoarthritis, back pain.  HPI     Home Medications Prior to Admission medications   Medication Sig Start Date End Date Taking? Authorizing Provider  ALPRAZolam Duanne Moron) 0.25 MG tablet Take 0.25 mg by mouth at bedtime as needed for anxiety.    [provider]  amLODipine (NORVASC) 5 MG tablet TAKE 1 TABLET BY MOUTH EVERY DAY 09/17/16   Velna Ochs, MD  Calcium Carb-Cholecalciferol 600-10 MG-MCG TABS daily at 6 (six) AM.    [provider]  cyclobenzaprine (FLEXERIL) 10 MG tablet Take by mouth as needed for pain. 04/10/21   [provider]  diclofenac Sodium (VOLTAREN) 1 % GEL Apply topically as needed. 03/27/21   [provider]  DULoxetine (CYMBALTA) 60 MG capsule Take by mouth daily. 10/17/18   [provider]  hydrOXYzine (ATARAX/VISTARIL) 10 MG tablet Take 10 mg by mouth.  07/20/18   [provider]  levothyroxine (SYNTHROID, LEVOTHROID) 25 MCG  tablet Take 25 mcg by mouth daily before breakfast.    [provider]  lidocaine (LIDODERM) 5 % Place 1 patch onto the skin daily. Remove & Discard patch within 12 hours or as directed by MD 02/11/21   Geryl Councilman L, PA  losartan (COZAAR) 50 MG tablet Take 1 tablet (50 mg total) by mouth daily. 05/29/21   Jerline Pain, MD  meclizine (ANTIVERT) 25 MG tablet Take 25 mg by mouth 3 (three) times daily as needed for dizziness or nausea.    [provider]  meloxicam (MOBIC) 15 MG tablet Take 30 mg by mouth daily.     [provider]  metoprolol succinate (TOPROL-XL) 25 MG 24 hr tablet TAKE 1 TABLET (25 MG TOTAL) BY MOUTH DAILY. 06/04/21   Jerline Pain, MD  omeprazole (PRILOSEC) 40 MG capsule Take 40 mg by mouth daily.    [provider]  rosuvastatin (CRESTOR) 10 MG tablet rosuvastatin 10 mg tablet  TAKE 1 TABLET BY MOUTH EVERYDAY AT BEDTIME    [provider]      Allergies    Ibuprofen, Lidocaine, and Olanzapine    Review of Systems   Review of Systems  HENT:         Left eyebrow pain/swelling  Respiratory:  Negative for shortness of breath.   Cardiovascular:  Negative for chest pain.  Gastrointestinal:  Negative for abdominal pain.  Musculoskeletal:  Positive for neck pain (Mild neck pain).  Neurological:  Positive for headaches. Negative for syncope and light-headedness.    Physical Exam Updated Vital Signs BP (!) 149/90   Pulse 99   Temp 98.3 F (36.8 C) (Oral)   Resp 14   Ht '5\' 4"'$  (1.626 m)   Wt 74.8 kg   LMP 03/27/1984   SpO2 95%   BMI 28.32 kg/m  Physical Exam Vitals and nursing note reviewed.  Constitutional:      General: She is not in acute distress.    Appearance: She is well-developed.  HENT:     Head: Normocephalic and atraumatic.  Eyes:     Extraocular Movements: Extraocular movements intact.     Conjunctiva/sclera: Conjunctivae normal.     Pupils: Pupils are equal, round, and reactive to light.   Cardiovascular:     Rate and Rhythm: Normal rate.  Pulmonary:     Effort: Pulmonary effort is normal. No respiratory distress.  Abdominal:     Palpations: Abdomen is soft.     Tenderness: There is no abdominal tenderness.  Musculoskeletal:        General: Swelling (Swelling noted just over left eyebrow) and tenderness present. No deformity.     Cervical back: Neck supple.  Skin:    General: Skin is warm and dry.     Capillary Refill: Capillary refill takes less than 2 seconds.  Neurological:     General: No focal deficit present.     Mental Status: She is alert and oriented to person, place, and time.  Psychiatric:        Mood and Affect: Mood normal.     ED Results / Procedures / Treatments   Labs (all labs ordered are listed, but only abnormal results are displayed) Labs Reviewed - No data to display  EKG EKG Interpretation  Date/Time:  Friday November 06 2021 10:04:27 EDT Ventricular Rate:  77 PR Interval:  137 QRS Duration: 100 QT Interval:  395 QTC Calculation: 447 R Axis:   21 Text Interpretation: Sinus rhythm Abnormal R-wave progression, early transition Borderline T abnormalities, anterior leads similar to May 2017 Confirmed by Sherwood Gambler [id number]) on 11/06/2021 10:12:59 AM  Radiology CT Head Wo Contrast  Addendum Date: 11/06/2021   ADDENDUM REPORT: 11/06/2021 11:03 ADDENDUM: Critical Value/emergent results were called by telephone on 11/06/2021 at 11:03 am to provider [facility] , who verbally acknowledged these results. Electronically Signed   By: Maurine Simmering M.D.   On: 11/06/2021 11:03   Result Date: 11/06/2021 CLINICAL DATA:  Neck trauma (Age >= 65y); Head trauma, minor (Age >= 65y); Facial trauma, blunt EXAM: CT HEAD WITHOUT CONTRAST CT MAXILLOFACIAL WITHOUT CONTRAST CT CERVICAL SPINE WITHOUT CONTRAST TECHNIQUE: Multidetector CT imaging of the head, cervical spine, and maxillofacial structures were performed using the standard protocol without  intravenous contrast. Multiplanar CT image reconstructions of the cervical spine and maxillofacial structures were also generated. RADIATION DOSE REDUCTION: This exam was performed according to the departmental dose-optimization program which includes automated exposure control, adjustment of the mA and/or kV according to patient size and/or use of iterative reconstruction technique. COMPARISON:  Head CT 10/22/2018 FINDINGS: CT HEAD FINDINGS Brain: There is a left frontal subdural hematoma, measuring up to 3 mm short axis (coronal image 29).No concerning mass effect. Patent basal cisterns.No midline shift. The ventricles are normal in size. Vascular: Vascular calcifications.  No hyperdense vessel. Skull: Negative for skull fracture. Other: None. CT MAXILLOFACIAL FINDINGS Osseous: No fracture or mandibular dislocation. Bilateral temporomandibular joint osteoarthritis. Orbits: Negative. No traumatic or inflammatory  finding. Sinuses: Clear. Soft tissues: Left periorbital soft tissue swelling and small hematoma. Left facial soft tissue swelling. CT CERVICAL SPINE FINDINGS Alignment: Normal. Skull base and vertebrae: No acute cervical spine fracture. No aggressive osseous lesion. There is a nondisplaced fracture along the upper aspect of the posteromedial right first rib. Soft tissues and spinal canal: No prevertebral fluid or swelling. No visible canal hematoma. Disc levels: There is moderate degenerative disc disease at C5-C6. Mild degenerative disc disease at C4-C5. Mild multilevel facet arthropathy. Upper chest: Negative. Other: None IMPRESSION: Small left frontal subdural hematoma, measuring up to 3 mm short axis. No concerning mass effect. No acute facial fracture. Left periorbital soft tissue swelling and small hematoma. No acute cervical spine fracture. Small nondisplaced fracture along the upper aspect of the right posteromedial first rib. Electronically Signed: By: Maurine Simmering M.D. On: 11/06/2021 10:59   CT  Cervical Spine Wo Contrast  Addendum Date: 11/06/2021   ADDENDUM REPORT: 11/06/2021 11:03 ADDENDUM: Critical Value/emergent results were called by telephone on 11/06/2021 at 11:03 am to provider [facility] , who verbally acknowledged these results. Electronically Signed   By: Maurine Simmering M.D.   On: 11/06/2021 11:03   Result Date: 11/06/2021 CLINICAL DATA:  Neck trauma (Age >= 65y); Head trauma, minor (Age >= 65y); Facial trauma, blunt EXAM: CT HEAD WITHOUT CONTRAST CT MAXILLOFACIAL WITHOUT CONTRAST CT CERVICAL SPINE WITHOUT CONTRAST TECHNIQUE: Multidetector CT imaging of the head, cervical spine, and maxillofacial structures were performed using the standard protocol without intravenous contrast. Multiplanar CT image reconstructions of the cervical spine and maxillofacial structures were also generated. RADIATION DOSE REDUCTION: This exam was performed according to the departmental dose-optimization program which includes automated exposure control, adjustment of the mA and/or kV according to patient size and/or use of iterative reconstruction technique. COMPARISON:  Head CT 10/22/2018 FINDINGS: CT HEAD FINDINGS Brain: There is a left frontal subdural hematoma, measuring up to 3 mm short axis (coronal image 29).No concerning mass effect. Patent basal cisterns.No midline shift. The ventricles are normal in size. Vascular: Vascular calcifications.  No hyperdense vessel. Skull: Negative for skull fracture. Other: None. CT MAXILLOFACIAL FINDINGS Osseous: No fracture or mandibular dislocation. Bilateral temporomandibular joint osteoarthritis. Orbits: Negative. No traumatic or inflammatory finding. Sinuses: Clear. Soft tissues: Left periorbital soft tissue swelling and small hematoma. Left facial soft tissue swelling. CT CERVICAL SPINE FINDINGS Alignment: Normal. Skull base and vertebrae: No acute cervical spine fracture. No aggressive osseous lesion. There is a nondisplaced fracture along the upper aspect of the  posteromedial right first rib. Soft tissues and spinal canal: No prevertebral fluid or swelling. No visible canal hematoma. Disc levels: There is moderate degenerative disc disease at C5-C6. Mild degenerative disc disease at C4-C5. Mild multilevel facet arthropathy. Upper chest: Negative. Other: None IMPRESSION: Small left frontal subdural hematoma, measuring up to 3 mm short axis. No concerning mass effect. No acute facial fracture. Left periorbital soft tissue swelling and small hematoma. No acute cervical spine fracture. Small nondisplaced fracture along the upper aspect of the right posteromedial first rib. Electronically Signed: By: Maurine Simmering M.D. On: 11/06/2021 10:59   CT Maxillofacial Wo Contrast  Addendum Date: 11/06/2021   ADDENDUM REPORT: 11/06/2021 11:03 ADDENDUM: Critical Value/emergent results were called by telephone on 11/06/2021 at 11:03 am to provider [facility] , who verbally acknowledged these results. Electronically Signed   By: Maurine Simmering M.D.   On: 11/06/2021 11:03   Result Date: 11/06/2021 CLINICAL DATA:  Neck trauma (Age >= 65y); Head trauma, minor (  Age >= 65y); Facial trauma, blunt EXAM: CT HEAD WITHOUT CONTRAST CT MAXILLOFACIAL WITHOUT CONTRAST CT CERVICAL SPINE WITHOUT CONTRAST TECHNIQUE: Multidetector CT imaging of the head, cervical spine, and maxillofacial structures were performed using the standard protocol without intravenous contrast. Multiplanar CT image reconstructions of the cervical spine and maxillofacial structures were also generated. RADIATION DOSE REDUCTION: This exam was performed according to the departmental dose-optimization program which includes automated exposure control, adjustment of the mA and/or kV according to patient size and/or use of iterative reconstruction technique. COMPARISON:  Head CT 10/22/2018 FINDINGS: CT HEAD FINDINGS Brain: There is a left frontal subdural hematoma, measuring up to 3 mm short axis (coronal image 29).No concerning mass  effect. Patent basal cisterns.No midline shift. The ventricles are normal in size. Vascular: Vascular calcifications.  No hyperdense vessel. Skull: Negative for skull fracture. Other: None. CT MAXILLOFACIAL FINDINGS Osseous: No fracture or mandibular dislocation. Bilateral temporomandibular joint osteoarthritis. Orbits: Negative. No traumatic or inflammatory finding. Sinuses: Clear. Soft tissues: Left periorbital soft tissue swelling and small hematoma. Left facial soft tissue swelling. CT CERVICAL SPINE FINDINGS Alignment: Normal. Skull base and vertebrae: No acute cervical spine fracture. No aggressive osseous lesion. There is a nondisplaced fracture along the upper aspect of the posteromedial right first rib. Soft tissues and spinal canal: No prevertebral fluid or swelling. No visible canal hematoma. Disc levels: There is moderate degenerative disc disease at C5-C6. Mild degenerative disc disease at C4-C5. Mild multilevel facet arthropathy. Upper chest: Negative. Other: None IMPRESSION: Small left frontal subdural hematoma, measuring up to 3 mm short axis. No concerning mass effect. No acute facial fracture. Left periorbital soft tissue swelling and small hematoma. No acute cervical spine fracture. Small nondisplaced fracture along the upper aspect of the right posteromedial first rib. Electronically Signed: By: Maurine Simmering M.D. On: 11/06/2021 10:59    Procedures Procedures    Medications Ordered in ED Medications  oxyCODONE-acetaminophen (PERCOCET/ROXICET) 5-325 MG per tablet 1 tablet (has no administration in time range)  HYDROcodone-acetaminophen (NORCO/VICODIN) 5-325 MG per tablet 1 tablet (1 tablet Oral Given 11/06/21 0931)    ED Course/ Medical Decision Making/ A&P                           Medical Decision Making Amount and/or Complexity of Data Reviewed Radiology: ordered.  Risk Prescription drug management.   This patient presents to the ED for concern of headache secondary to fall,  this involves an extensive number of treatment options, and is a complaint that carries with it a high risk of complications and morbidity.  The differential diagnosis includes intracranial abnormality, fracture, dislocation, soft tissue injury, and others   Co morbidities that complicate the patient evaluation  Reported history of frequent falls, previous concussion, hypertension   Additional history obtained:  Additional history obtained from EMS External records from outside source obtained and reviewed including multiple orthopedic notes showing visits for shoulder and back pain   Lab Tests:  I considered ordering labs but with the patient being alert and oriented and remembering tripping over the block I see no utility at this time  Imaging Studies ordered:  I ordered imaging studies including CT head, cervical spine, maxillofacial I independently visualized and interpreted imaging which showed  Small left frontal subdural hematoma, measuring up to 3 mm short  axis. No concerning mass effect.    No acute facial fracture. Left periorbital soft tissue swelling and  small hematoma.    No acute cervical  spine fracture.    Small nondisplaced fracture along the upper aspect of the right  posteromedial first rib.   I agree with the radiologist interpretation   Cardiac Monitoring: / EKG:  The patient was maintained on a cardiac monitor.  I personally viewed and interpreted the cardiac monitored which showed an underlying rhythm of: Sinus rhythm   Consultations Obtained:  I requested consultation with the neurosurgeon, Dr. Glenford Peers, and discussed lab and imaging findings as well as pertinent plan - they recommend: Repeat head CT 6 to 8 hours after initial.  If subdural hematoma has not expanded in size patient should be safe to discharge home with follow-up in 2 to 3 weeks.  If has expanded, plan to reconsult neurosurgeon   Problem List / ED Course / Critical interventions /  Medication management   I ordered medication including hydrocodone for pain Reevaluation of the patient after these medicines showed that the patient improved I have reviewed the patients home medicines and have made adjustments as needed   Social Determinants of Health:  Patient lives alone   Test / Admission - Considered:  Patient care transferred to Domenic Moras, PA-C at shift handoff. Plan for repeat head CT at 6:30pm. Discharge with outpatient follow up in 2-3 weeks if subdural hematoma is stable. If hematoma has expanded, re-consult neurosurgery for further recommendations.  No follow-up required for patient's left-sided rib fracture.        Final Clinical Impression(s) / ED Diagnoses Final diagnoses:  Fall, initial encounter  Subdural hematoma ([place])  Contusion of face, initial encounter    Rx / DC Orders ED Discharge Orders     None         Ronny Bacon 11/06/21 1531    Sherwood Gambler, MD 11/09/21 [phone]

## 2021-11-06 NOTE — ED Triage Notes (Signed)
Pt coming from home via EMS with c/o fall. Pt states that she tripped over a book and fell today. Denies Loc, no bloodthinners. Pt states that she did hit her head. Swelling and bruising noted to Left outer eyebrow. Pt also c/o a headache.

## 2021-11-06 NOTE — ED Provider Notes (Signed)
Received signout from previous provider, please see his note for complete H&P.  This is a 76 year old female who was complaining of headache and left-sided facial pain after a fall earlier this morning.  Her work-up was remarkable for evidence of a subdural hematoma as well as left-sided rib fracture.  Neurosurgeon was consulted and request for repeat head CT scan at this x-ray after after initial injury.  If the he is subdural hematoma has not expanded in size the patient should be safe to be discharged home with follow-up in 2 to 3 weeks.  A repeat head CT scan obtained showing no significant interval change in the small left frontal subdural hemorrhage and no new bleed.  Patient simply behaving appropriately.  She is made aware of finding at this time she is stable to be discharged with appropriate follow-up.  BP (!) 159/87   Pulse 77   Temp 98 F (36.7 C)   Resp 16   Ht '5\' 4"'$  (1.626 m)   Wt 74.8 kg   LMP 03/27/1984   SpO2 97%   BMI 28.32 kg/m   Results for orders placed or performed in visit on 01/01/15  Myocardial Perfusion Imaging  Result Value Ref Range   Rest HR 76 bpm   Rest BP 136/87 mmHg   Exercise duration (min) 7 min   Exercise duration (sec) 0 sec   Estimated workload 7.1 METS   Peak HR 131 bpm   Peak BP 196/90 mmHg   MPHR 150 bpm   Percent HR 87 %   RPE     LV sys vol 23 mL   TID 0.97    LV dias vol 62 mL   RATE 0.28    SSS 3    SRS 1    SDS 2    CT Head Wo Contrast  Result Date: 11/06/2021 CLINICAL DATA:  Head trauma.  Follow-up subdural hematoma. EXAM: CT HEAD WITHOUT CONTRAST TECHNIQUE: Contiguous axial images were obtained from the base of the skull through the vertex without intravenous contrast. RADIATION DOSE REDUCTION: This exam was performed according to the departmental dose-optimization program which includes automated exposure control, adjustment of the mA and/or kV according to patient size and/or use of iterative reconstruction technique.  COMPARISON:  Earlier head CT dated 11/06/2021. FINDINGS: Brain: Small left frontal subdural hemorrhage appears similar to earlier CT. No new bleed. There is no mass effect or midline shift. There is mild age-related atrophy and chronic microvascular ischemic changes. Vascular: No hyperdense vessel or unexpected calcification. Skull: Normal. Negative for fracture or focal lesion. Sinuses/Orbits: No acute finding. Other: None IMPRESSION: 1. No significant interval change in the small left frontal subdural hemorrhage. No new bleed. 2. Mild age-related atrophy and chronic microvascular ischemic changes. Electronically Signed   By: Anner Crete M.D.   On: 11/06/2021 19:56   CT Head Wo Contrast  Addendum Date: 11/06/2021   ADDENDUM REPORT: 11/06/2021 11:03 ADDENDUM: Critical Value/emergent results were called by telephone on 11/06/2021 at 11:03 am to provider [facility] , who verbally acknowledged these results. Electronically Signed   By: Maurine Simmering M.D.   On: 11/06/2021 11:03   Result Date: 11/06/2021 CLINICAL DATA:  Neck trauma (Age >= 65y); Head trauma, minor (Age >= 65y); Facial trauma, blunt EXAM: CT HEAD WITHOUT CONTRAST CT MAXILLOFACIAL WITHOUT CONTRAST CT CERVICAL SPINE WITHOUT CONTRAST TECHNIQUE: Multidetector CT imaging of the head, cervical spine, and maxillofacial structures were performed using the standard protocol without intravenous contrast. Multiplanar CT image reconstructions of the  cervical spine and maxillofacial structures were also generated. RADIATION DOSE REDUCTION: This exam was performed according to the departmental dose-optimization program which includes automated exposure control, adjustment of the mA and/or kV according to patient size and/or use of iterative reconstruction technique. COMPARISON:  Head CT 10/22/2018 FINDINGS: CT HEAD FINDINGS Brain: There is a left frontal subdural hematoma, measuring up to 3 mm short axis (coronal image 29).No concerning mass effect. Patent  basal cisterns.No midline shift. The ventricles are normal in size. Vascular: Vascular calcifications.  No hyperdense vessel. Skull: Negative for skull fracture. Other: None. CT MAXILLOFACIAL FINDINGS Osseous: No fracture or mandibular dislocation. Bilateral temporomandibular joint osteoarthritis. Orbits: Negative. No traumatic or inflammatory finding. Sinuses: Clear. Soft tissues: Left periorbital soft tissue swelling and small hematoma. Left facial soft tissue swelling. CT CERVICAL SPINE FINDINGS Alignment: Normal. Skull base and vertebrae: No acute cervical spine fracture. No aggressive osseous lesion. There is a nondisplaced fracture along the upper aspect of the posteromedial right first rib. Soft tissues and spinal canal: No prevertebral fluid or swelling. No visible canal hematoma. Disc levels: There is moderate degenerative disc disease at C5-C6. Mild degenerative disc disease at C4-C5. Mild multilevel facet arthropathy. Upper chest: Negative. Other: None IMPRESSION: Small left frontal subdural hematoma, measuring up to 3 mm short axis. No concerning mass effect. No acute facial fracture. Left periorbital soft tissue swelling and small hematoma. No acute cervical spine fracture. Small nondisplaced fracture along the upper aspect of the right posteromedial first rib. Electronically Signed: By: Maurine Simmering M.D. On: 11/06/2021 10:59   CT Cervical Spine Wo Contrast  Addendum Date: 11/06/2021   ADDENDUM REPORT: 11/06/2021 11:03 ADDENDUM: Critical Value/emergent results were called by telephone on 11/06/2021 at 11:03 am to provider [facility] , who verbally acknowledged these results. Electronically Signed   By: Maurine Simmering M.D.   On: 11/06/2021 11:03   Result Date: 11/06/2021 CLINICAL DATA:  Neck trauma (Age >= 65y); Head trauma, minor (Age >= 65y); Facial trauma, blunt EXAM: CT HEAD WITHOUT CONTRAST CT MAXILLOFACIAL WITHOUT CONTRAST CT CERVICAL SPINE WITHOUT CONTRAST TECHNIQUE: Multidetector CT imaging  of the head, cervical spine, and maxillofacial structures were performed using the standard protocol without intravenous contrast. Multiplanar CT image reconstructions of the cervical spine and maxillofacial structures were also generated. RADIATION DOSE REDUCTION: This exam was performed according to the departmental dose-optimization program which includes automated exposure control, adjustment of the mA and/or kV according to patient size and/or use of iterative reconstruction technique. COMPARISON:  Head CT 10/22/2018 FINDINGS: CT HEAD FINDINGS Brain: There is a left frontal subdural hematoma, measuring up to 3 mm short axis (coronal image 29).No concerning mass effect. Patent basal cisterns.No midline shift. The ventricles are normal in size. Vascular: Vascular calcifications.  No hyperdense vessel. Skull: Negative for skull fracture. Other: None. CT MAXILLOFACIAL FINDINGS Osseous: No fracture or mandibular dislocation. Bilateral temporomandibular joint osteoarthritis. Orbits: Negative. No traumatic or inflammatory finding. Sinuses: Clear. Soft tissues: Left periorbital soft tissue swelling and small hematoma. Left facial soft tissue swelling. CT CERVICAL SPINE FINDINGS Alignment: Normal. Skull base and vertebrae: No acute cervical spine fracture. No aggressive osseous lesion. There is a nondisplaced fracture along the upper aspect of the posteromedial right first rib. Soft tissues and spinal canal: No prevertebral fluid or swelling. No visible canal hematoma. Disc levels: There is moderate degenerative disc disease at C5-C6. Mild degenerative disc disease at C4-C5. Mild multilevel facet arthropathy. Upper chest: Negative. Other: None IMPRESSION: Small left frontal subdural hematoma, measuring up to 3  mm short axis. No concerning mass effect. No acute facial fracture. Left periorbital soft tissue swelling and small hematoma. No acute cervical spine fracture. Small nondisplaced fracture along the upper aspect of  the right posteromedial first rib. Electronically Signed: By: Maurine Simmering M.D. On: 11/06/2021 10:59   CT Maxillofacial Wo Contrast  Addendum Date: 11/06/2021   ADDENDUM REPORT: 11/06/2021 11:03 ADDENDUM: Critical Value/emergent results were called by telephone on 11/06/2021 at 11:03 am to provider [facility] , who verbally acknowledged these results. Electronically Signed   By: Maurine Simmering M.D.   On: 11/06/2021 11:03   Result Date: 11/06/2021 CLINICAL DATA:  Neck trauma (Age >= 65y); Head trauma, minor (Age >= 65y); Facial trauma, blunt EXAM: CT HEAD WITHOUT CONTRAST CT MAXILLOFACIAL WITHOUT CONTRAST CT CERVICAL SPINE WITHOUT CONTRAST TECHNIQUE: Multidetector CT imaging of the head, cervical spine, and maxillofacial structures were performed using the standard protocol without intravenous contrast. Multiplanar CT image reconstructions of the cervical spine and maxillofacial structures were also generated. RADIATION DOSE REDUCTION: This exam was performed according to the departmental dose-optimization program which includes automated exposure control, adjustment of the mA and/or kV according to patient size and/or use of iterative reconstruction technique. COMPARISON:  Head CT 10/22/2018 FINDINGS: CT HEAD FINDINGS Brain: There is a left frontal subdural hematoma, measuring up to 3 mm short axis (coronal image 29).No concerning mass effect. Patent basal cisterns.No midline shift. The ventricles are normal in size. Vascular: Vascular calcifications.  No hyperdense vessel. Skull: Negative for skull fracture. Other: None. CT MAXILLOFACIAL FINDINGS Osseous: No fracture or mandibular dislocation. Bilateral temporomandibular joint osteoarthritis. Orbits: Negative. No traumatic or inflammatory finding. Sinuses: Clear. Soft tissues: Left periorbital soft tissue swelling and small hematoma. Left facial soft tissue swelling. CT CERVICAL SPINE FINDINGS Alignment: Normal. Skull base and vertebrae: No acute cervical  spine fracture. No aggressive osseous lesion. There is a nondisplaced fracture along the upper aspect of the posteromedial right first rib. Soft tissues and spinal canal: No prevertebral fluid or swelling. No visible canal hematoma. Disc levels: There is moderate degenerative disc disease at C5-C6. Mild degenerative disc disease at C4-C5. Mild multilevel facet arthropathy. Upper chest: Negative. Other: None IMPRESSION: Small left frontal subdural hematoma, measuring up to 3 mm short axis. No concerning mass effect. No acute facial fracture. Left periorbital soft tissue swelling and small hematoma. No acute cervical spine fracture. Small nondisplaced fracture along the upper aspect of the right posteromedial first rib. Electronically Signed: By: Maurine Simmering M.D. On: 11/06/2021 10:59         Domenic Moras, PA-C 11/06/21 2034    Gareth Morgan, MD 11/09/21 2229

## 2021-11-06 NOTE — Discharge Instructions (Addendum)
You have been evaluated for your fall.  You have been diagnosed with having a small left subdural hematoma.  Please avoid any activities that can cause you to fall.  You may take opiate pain medication but be aware that it may increase your risk of drowsiness.  It appears you have a small break on your right upper rib.  You may use a pillow to brace the chest when you exert yourself such as laughing or coughing to decrease your discomfort.  Please call and follow-up with neurosurgeon in 2 weeks for further care.  Return if you have any concern.

## 2022-03-10 LAB — LAB REPORT - SCANNED
A1c: 6.2
EGFR: 75

## 2022-03-15 ENCOUNTER — Other Ambulatory Visit: Payer: Self-pay | Admitting: Family Medicine

## 2022-03-15 DIAGNOSIS — N644 Mastodynia: Secondary | ICD-10-CM

## 2022-04-05 ENCOUNTER — Telehealth: Payer: Self-pay | Admitting: Cardiology

## 2022-04-05 NOTE — Telephone Encounter (Signed)
Spoke with pt who reports her daughter took he to [facility] for evaluation d/t elevated BP.  210/110.  Pt reports she was checked for "everything" and ended up having her Amlodipine increase to 10 mg daily.  This has helped with her BP however she is having a lot of ankle edema.  She reports having some shortness of breath as well however this is unchanged from when she was since at [facility].  Advised pt to follow up with her PCP for medication adjustment for control of HTN/edema.  Attempted to schedule pt with APP in office for sooner evaluation here however she reports she prefers to wait to see Dr Marlou Porch as scheduled.  She will call back prior to then if any further questions/concerns.

## 2022-04-05 NOTE — Telephone Encounter (Signed)
Patient requested that Dr. Kingsley Plan or his nurse reaches out to the patient in regards to some health concerns she has. Patient is complaining of a hypertension crisis and edema within her ankles. Patient made an appointment just in case but would like to discuss symptoms and next steps.

## 2022-04-20 ENCOUNTER — Ambulatory Visit: Payer: Medicare Other | Admitting: Dermatology

## 2022-05-06 ENCOUNTER — Ambulatory Visit: Payer: Medicare Other | Admitting: Dermatology

## 2022-05-24 ENCOUNTER — Other Ambulatory Visit: Payer: Self-pay | Admitting: Cardiology

## 2022-06-01 ENCOUNTER — Other Ambulatory Visit: Payer: Self-pay | Admitting: *Deleted

## 2022-06-03 ENCOUNTER — Telehealth: Payer: Self-pay | Admitting: Cardiology

## 2022-06-03 MED ORDER — LOSARTAN POTASSIUM 50 MG PO TABS: 50.0000 mg | ORAL_TABLET | Freq: Every day | ORAL | 0 refills | Status: DC

## 2022-06-03 NOTE — Telephone Encounter (Signed)
Pt's medication was sent to pt's pharmacy as requested. Confirmation received.  °

## 2022-06-03 NOTE — Telephone Encounter (Signed)
*  STAT* If patient is at the pharmacy, call can be transferred to refill team.   1. Which medications need to be refilled? (please list name of each medication and dose if known) losartan (COZAAR) 50 MG tablet ; metoprolol succinate (TOPROL-XL) 25 MG 24 hr tablet   2. Which pharmacy/location (including street and city if local pharmacy) is medication to be sent [facility]? [facility] [place], [place] - [address]   3. Do they need a 30 day or 90 day supply? 90

## 2022-07-15 ENCOUNTER — Other Ambulatory Visit: Payer: Self-pay | Admitting: Cardiology

## 2022-07-16 ENCOUNTER — Ambulatory Visit: Payer: Medicare Other | Attending: Cardiology | Admitting: Cardiology

## 2022-07-16 ENCOUNTER — Encounter: Payer: Self-pay | Admitting: Cardiology

## 2022-07-16 VITALS — BP 168/100 | HR 68 | Ht 64.0 in | Wt 170.0 lb

## 2022-07-16 DIAGNOSIS — I10 Essential (primary) hypertension: Secondary | ICD-10-CM | POA: Diagnosis present

## 2022-07-16 DIAGNOSIS — I2510 Atherosclerotic heart disease of native coronary artery without angina pectoris: Secondary | ICD-10-CM | POA: Diagnosis present

## 2022-07-16 DIAGNOSIS — R0609 Other forms of dyspnea: Secondary | ICD-10-CM | POA: Diagnosis present

## 2022-07-16 DIAGNOSIS — I2584 Coronary atherosclerosis due to calcified coronary lesion: Secondary | ICD-10-CM | POA: Insufficient documentation

## 2022-07-16 NOTE — Progress Notes (Signed)
Cardiology Office Note:    Date:  07/16/2022   ID:  Doris [name], DOB 02-18-1945, MRN [id number]  PCP:  Harvest Forest, MD   [facility] Providers Cardiologist:  Donato Schultz, MD     Referring MD: Harvest Forest, MD    History of Present Illness:    Doris [name] is a 77 y.o. female here for follow-up of blood pressure, prior ER visit for hypertensive urgency.  Still having some shortness of breath at times.  Tried to increase amlodipine appropriately but had increased swelling, edema.  Since going back on the dose, she feels better, edema has resolved.     Past Medical History:  Diagnosis Date   Acid reflux    Back pain    HLD (hyperlipidemia)    Hypertension    Insomnia    Migraine    Osteoarthritis    knee   Psychosis (HCC)    followed by Dr. Harrison Mons [phone]   Thyroid nodule 2010   TSH 04/2008 = 0.296, no follow up noted in [facility]   Vertigo     Past Surgical History:  Procedure Laterality Date   TONSILLECTOMY     UTERINE FIBROID SURGERY      Current Medications: Current Meds  Medication Sig   amLODipine (NORVASC) 5 MG tablet TAKE 1 TABLET BY MOUTH EVERY DAY   celecoxib (CELEBREX) 200 MG capsule Take 200 mg by mouth daily.   cyclobenzaprine (FLEXERIL) 10 MG tablet Take by mouth as needed for pain.   diclofenac Sodium (VOLTAREN) 1 % GEL Apply topically as needed.   DULoxetine (CYMBALTA) 60 MG capsule Take by mouth daily.   hydrOXYzine (ATARAX/VISTARIL) 10 MG tablet Take 10 mg by mouth.    levothyroxine (SYNTHROID, LEVOTHROID) 25 MCG tablet Take 25 mcg by mouth daily before breakfast.   losartan (COZAAR) 50 MG tablet Take 1 tablet (50 mg total) by mouth daily.   meloxicam (MOBIC) 15 MG tablet Take 30 mg by mouth daily.    metoprolol succinate (TOPROL-XL) 25 MG 24 hr tablet Take 1 tablet (25 mg total) by mouth daily.   omeprazole (PRILOSEC) 40 MG capsule Take 40 mg by mouth daily.   rosuvastatin (CRESTOR) 10 MG tablet rosuvastatin 10  mg tablet  TAKE 1 TABLET BY MOUTH EVERYDAY AT BEDTIME     Allergies:   Cortisone, Ibuprofen, Lidocaine, and Olanzapine   Social History   Socioeconomic History   Marital status: Divorced    Spouse name: Not on file   Number of children: Not on file   Years of education: Not on file   Highest education level: Not on file  Occupational History   Occupation: retired  Tobacco Use   Smoking status: Never   Smokeless tobacco: Never  Vaping Use   Vaping Use: Never used  Substance and Sexual Activity   Alcohol use: No    Comment: occ   Drug use: No   Sexual activity: Yes  Other Topics Concern   Not on file  Social History Narrative   Not on file   Social Determinants of Health   Financial Resource Strain: Not on file  Food Insecurity: Not on file  Transportation Needs: Not on file  Physical Activity: Not on file  Stress: Not on file  Social Connections: Not on file     Family History: The patient's family history includes Heart disease in her father, mother, and another family member. There is no history of Stroke, Cancer, or Colon cancer.  ROS:  Please see the history of present illness.     All other systems reviewed and are negative.  EKGs/Labs/Other Studies Reviewed:    The following studies were reviewed today: Cardiac Studies & Procedures     STRESS TESTS  MYOCARDIAL PERFUSION IMAGING 01/01/2015  Narrative  Nuclear stress EF: 63%. Normal LV function  The study is normal. No evidence of ischemia  This is a low risk study.   ECHOCARDIOGRAM  ECHOCARDIOGRAM COMPLETE 07/06/2019  Narrative ECHOCARDIOGRAM REPORT    Patient Name:   Doris [name] Date of Exam: 07/06/2019 Medical Rec #:  [id number]      Height:       64.0 in Accession #:    [id number]     Weight:       163.0 lb Date of Birth:  01/04/1946      BSA:          1.793 m Patient Age:    73 years       BP:           130/80 mmHg Patient Gender: F              HR:           73 bpm. Exam  Location:  [address]  Procedure: 2D Echo, Cardiac Doppler and Color Doppler  Indications:    R06.00  History:        Patient has prior history of Echocardiogram examinations, most recent 01/01/2015. COPD, Arrythmias:Tachycardia, Signs/Symptoms:Shortness of Breath; Risk Factors:Hypertension and Dyslipidemia.  Sonographer:    Samule Ohm RDCS Referring Phys: [id number] Azelie Noguera C Lerlene Treadwell  IMPRESSIONS   1. Low normal to mildly reduced LV systolic function (EF 50); mild LVH; grade 1 diastolic dysfunction. 2. Left ventricular ejection fraction, by estimation, is 50 to 55%. The left ventricle has low normal function. The left ventricle has no regional wall motion abnormalities. There is mild left ventricular hypertrophy of the basal and septal segments. Left ventricular diastolic parameters are consistent with Grade I diastolic dysfunction (impaired relaxation). 3. Right ventricular systolic function is normal. The right ventricular size is normal. There is normal pulmonary artery systolic pressure. 4. The mitral valve is normal in structure. Trivial mitral valve regurgitation. No evidence of mitral stenosis. 5. The aortic valve is tricuspid. Aortic valve regurgitation is not visualized. Mild aortic valve sclerosis is present, with no evidence of aortic valve stenosis. 6. The inferior vena cava is normal in size with greater than 50% respiratory variability, suggesting right atrial pressure of 3 mmHg.  FINDINGS Left Ventricle: Left ventricular ejection fraction, by estimation, is 50 to 55%. The left ventricle has low normal function. The left ventricle has no regional wall motion abnormalities. The left ventricular internal cavity size was normal in size. There is mild left ventricular hypertrophy of the basal and septal segments. Left ventricular diastolic parameters are consistent with Grade I diastolic dysfunction (impaired relaxation).  Right Ventricle: The right ventricular size is normal.  Right ventricular systolic function is normal. There is normal pulmonary artery systolic pressure. The tricuspid regurgitant velocity is 2.42 m/s, and with an assumed right atrial pressure of 3 mmHg, the estimated right ventricular systolic pressure is 26.4 mmHg.  Left Atrium: Left atrial size was normal in size.  Right Atrium: Right atrial size was normal in size.  Pericardium: There is no evidence of pericardial effusion.  Mitral Valve: The mitral valve is normal in structure. Normal mobility of the mitral valve leaflets. Trivial mitral valve regurgitation. No evidence of mitral valve  stenosis.  Tricuspid Valve: The tricuspid valve is normal in structure. Tricuspid valve regurgitation is trivial. No evidence of tricuspid stenosis.  Aortic Valve: The aortic valve is tricuspid. Aortic valve regurgitation is not visualized. Mild aortic valve sclerosis is present, with no evidence of aortic valve stenosis.  Pulmonic Valve: The pulmonic valve was normal in structure. Pulmonic valve regurgitation is not visualized. No evidence of pulmonic stenosis.  Aorta: The aortic root is normal in size and structure.  Venous: The inferior vena cava is normal in size with greater than 50% respiratory variability, suggesting right atrial pressure of 3 mmHg.  Additional Comments: Low normal to mildly reduced LV systolic function (EF 50); mild LVH; grade 1 diastolic dysfunction.   LEFT VENTRICLE PLAX 2D LVIDd:         3.50 cm  Diastology LVIDs:         2.30 cm  LV e' lateral:   8.38 cm/s LV PW:         0.90 cm  LV E/e' lateral: 6.1 LV IVS:        1.20 cm  LV e' medial:    4.90 cm/s LVOT diam:     2.00 cm  LV E/e' medial:  10.4 LV SV:         47 LV SV Index:   26 LVOT Area:     3.14 cm   RIGHT VENTRICLE             IVC RV S prime:     10.90 cm/s  IVC diam: 1.20 cm TAPSE (M-mode): 1.3 cm RVSP:           26.4 mmHg  LEFT ATRIUM             Index       RIGHT ATRIUM           Index LA diam:         3.40 cm 1.90 cm/m  RA Pressure: 3.00 mmHg LA Vol (A2C):   42.6 ml 23.75 ml/m RA Area:     11.10 cm LA Vol (A4C):   34.3 ml 19.13 ml/m RA Volume:   25.40 ml  14.16 ml/m LA Biplane Vol: 40.1 ml 22.36 ml/m AORTIC VALVE LVOT Vmax:   79.60 cm/s LVOT Vmean:  51.700 cm/s LVOT VTI:    0.150 m  AORTA Ao Root diam: 3.00 cm Ao Asc diam:  3.10 cm  MV E velocity: 51.00 cm/s  TRICUSPID VALVE MV A velocity: 69.80 cm/s  TR Peak grad:   23.4 mmHg MV E/A ratio:  0.73        TR Vmax:        242.00 cm/s Estimated RAP:  3.00 mmHg RVSP:           26.4 mmHg  SHUNTS Systemic VTI:  0.15 m Systemic Diam: 2.00 cm  Olga Millers MD Electronically signed by Olga Millers MD Signature Date/Time: 07/06/2019/2:13:27 PM    Final              EKG:  No new  Recent Labs: No results found for requested labs within last 365 days.  Recent Lipid Panel    Component Value Date/Time   CHOL 196 05/17/2014 1527   TRIG 168 (H) 05/17/2014 1527   HDL 68 05/17/2014 1527   CHOLHDL 2.9 05/17/2014 1527   VLDL 34 05/17/2014 1527   LDLCALC 94 05/17/2014 1527     Risk Assessment/Calculations:  Physical Exam:    VS:  BP (!) 168/100   Pulse 68   Ht 5\' 4"  (1.626 m)   Wt 170 lb (77.1 kg)   LMP 03/27/1984   SpO2 98%   BMI 29.18 kg/m     Wt Readings from Last 3 Encounters:  07/16/22 170 lb (77.1 kg)  11/06/21 165 lb (74.8 kg)  05/01/21 166 lb 12.8 oz (75.7 kg)     GEN:  Well nourished, well developed in no acute distress HEENT: Normal NECK: No JVD; No carotid bruits LYMPHATICS: No lymphadenopathy CARDIAC: RRR, no murmurs, rubs, gallops RESPIRATORY:  Clear to auscultation without rales, wheezing or rhonchi  ABDOMEN: Soft, non-tender, non-distended MUSCULOSKELETAL:  No edema; No deformity  SKIN: Warm and dry NEUROLOGIC:  Alert and oriented x 3 PSYCHIATRIC:  Normal affect   ASSESSMENT:    1. Essential hypertension   2. Coronary artery calcification   3. Dyspnea on  exertion    PLAN:    In order of problems listed above:   Coronary artery calcification -LAD RCA on prior CT scan of the chest.  She is currently on Crestor.  Goal LDL less than 70. -Lipids are being checked by Dr. Corky Downs.  LDL goal less than 70.   Essential hypertension -Medications reviewed as above no side effects.  Did have some increased swelling with higher doses of amlodipine.  I would like for her to talk with our hypertension clinic to make sure that her blood pressure is under good control.  She does have mild left ventricular hypertrophy.  This may be contributing somewhat to her shortness of breath.   Tachycardia -Has been a longstanding issue, continuing with Toprol.  She says that this has helped out significantly.   Dyspnea -Overall longstanding as well.  EF has been 50% on echocardiogram.  Prior nuclear stress test in 2016 showed normal EF with no ischemia.  Continue with conditioning effort for good blood pressure control.          Medication Adjustments/Labs and Tests Ordered: Current medicines are reviewed at length with the patient today.  Concerns regarding medicines are outlined above.  Orders Placed This Encounter  Procedures   AMB Referral to [facility]   No orders of the defined types were placed in this encounter.   Patient Instructions  Medication Instructions:  The current medical regimen is effective;  continue present plan and medications.  *If you need a refill on your cardiac medications before your next appointment, please call your pharmacy*   You have been referred to the Hypertension Clinic.  Follow-Up: At [facility], you and your health needs are our priority.  As part of our continuing mission to provide you with exceptional heart care, we have created designated Provider Care Teams.  These Care Teams include your primary Cardiologist (physician) and Advanced Practice Providers (APPs -  Physician Assistants and Nurse  Practitioners) who all work together to provide you with the care you need, when you need it.  We recommend signing up for the patient portal called "MyChart".  Sign up information is provided on this After Visit Summary.  MyChart is used to connect with patients for Virtual Visits (Telemedicine).  Patients are able to view lab/test results, encounter notes, upcoming appointments, etc.  Non-urgent messages can be sent to your provider as well.   To learn more about what you can do with MyChart, go to [email or web address].    Your next appointment:   1 year(s)  Provider:  Donato Schultz, MD        Signed, Donato Schultz, MD  07/16/2022 11:32 AM    [facility]

## 2022-07-16 NOTE — Patient Instructions (Signed)
Medication Instructions:  The current medical regimen is effective;  continue present plan and medications.  *If you need a refill on your cardiac medications before your next appointment, please call your pharmacy*   You have been referred to the Hypertension Clinic.  Follow-Up: At [facility], you and your health needs are our priority.  As part of our continuing mission to provide you with exceptional heart care, we have created designated Provider Care Teams.  These Care Teams include your primary Cardiologist (physician) and Advanced Practice Providers (APPs -  Physician Assistants and Nurse Practitioners) who all work together to provide you with the care you need, when you need it.  We recommend signing up for the patient portal called "MyChart".  Sign up information is provided on this After Visit Summary.  MyChart is used to connect with patients for Virtual Visits (Telemedicine).  Patients are able to view lab/test results, encounter notes, upcoming appointments, etc.  Non-urgent messages can be sent to your provider as well.   To learn more about what you can do with MyChart, go to [email or web address].    Your next appointment:   1 year(s)  Provider:   Donato Schultz, MD

## 2022-08-10 ENCOUNTER — Ambulatory Visit: Payer: Medicare Other | Attending: Cardiovascular Disease | Admitting: Pharmacist

## 2022-08-10 VITALS — BP 140/90 | HR 96

## 2022-08-10 DIAGNOSIS — I10 Essential (primary) hypertension: Secondary | ICD-10-CM | POA: Insufficient documentation

## 2022-08-10 MED ORDER — IRBESARTAN 150 MG PO TABS: 150.0000 mg | ORAL_TABLET | Freq: Every day | ORAL | 3 refills | Status: DC

## 2022-08-10 NOTE — Patient Instructions (Signed)
Summary of today's discussion  1.STOP COZARR  2. Start irbesartan (AVAPRO)  3. Continue amlodipine 5mg  daily  4.Please check blood pressure daily and record your readings  5.Please bring your blood pressure cuff and list of blood pressures to next visit   Your blood pressure goal is <130/80  To check your pressure at home you will need to:  1. Sit up in a chair, with feet flat on the floor and back supported. Do not cross your ankles or legs. 2. Rest your left arm so that the cuff is about heart level. If the cuff goes on your upper arm,  then just relax the arm on the table, arm of the chair or your lap. If you have a wrist cuff, we  suggest relaxing your wrist against your chest (think of it as Pledging the Flag with the  wrong arm).  3. Place the cuff snugly around your arm, about 1 inch above the crook of your elbow. The  cords should be inside the groove of your elbow.  4. Sit quietly, with the cuff in place, for about 5 minutes. After that 5 minutes press the power  button to start a reading. 5. Do not talk or move while the reading is taking place.  6. Record your readings on a sheet of paper. Although most cuffs have a memory, it is often  easier to see a pattern developing when the numbers are all in front of you.  7. You can repeat the reading after 1-3 minutes if it is recommended  Make sure your bladder is empty and you have not had caffeine or tobacco within the last 30 min  Always bring your blood pressure log with you to your appointments. If you have not brought your monitor in to be double checked for accuracy, please bring it to your next appointment.  You can find a list of validated (accurate) blood pressure cuffs at [email or web address]   Important lifestyle changes to control high blood pressure  Intervention  Effect on the BP  Lose extra pounds and watch your waistline Weight loss is one of the most effective lifestyle changes for controlling blood pressure. If  you're overweight or obese, losing even a small amount of weight can help reduce blood pressure. Blood pressure might go down by about 1 millimeter of mercury (mm Hg) with each kilogram (about 2.2 pounds) of weight lost.  Exercise regularly As a general goal, aim for at least 30 minutes of moderate physical activity every day. Regular physical activity can lower high blood pressure by about 5 to 8 mm Hg.  Eat a healthy diet Eating a diet rich in whole grains, fruits, vegetables, and low-fat dairy products and low in saturated fat and cholesterol. A healthy diet can lower high blood pressure by up to 11 mm Hg.  Reduce salt (sodium) in your diet Even a small reduction of sodium in the diet can improve heart health and reduce high blood pressure by about 5 to 6 mm Hg.  Limit alcohol One drink equals 12 ounces of beer, 5 ounces of wine, or 1.5 ounces of 80-proof liquor.  Limiting alcohol to less than one drink a day for women or two drinks a day for men can help lower blood pressure by about 4 mm Hg.      Please call me at [phone] with any questions.

## 2022-08-10 NOTE — Progress Notes (Signed)
Patient ID: Doris [name]                 DOB: Apr 17, 1945                      MRN: [id number]      HPI: Doris [name] is a 77 y.o. female referred by Dr. Anne Fu to HTN clinic. PMH is significant for HTN, acid reflux, OA, HDL and CAD seen on CT. Seen in ER in Jan for hypertensive urgency. Her amlodipine was increased to 10mg  but this caused edema. Seen by Dr. Anne Fu on 5/31. BP in office was 168/100.  Patient presents today to HTN clinic.  She reports that her blood pressure in the 140s/90 is good for her.  She states that she feels off when her blood pressure is less than 140 over when it is high.  States sometimes her blood pressure is in the 160s systolic over up to 100.  She did not bring any readings or her blood pressure cuff.  She thinks that back in January when her blood pressure went significantly high it was because she was using too much Voltaren gel.  Previously on meloxicam, now on Celebrex.  However, she stopped Celebrex a few days ago because she thinks might be constipating her.  She stopped exercising at the gym due to pain in her knee.  She did just get some gel placed in her knee and it is feeling better.  She is now doing water aerobics and enjoying it.  She goes twice a week and then 1 day a week she will take swim lessons.  States she craves salty foods.  She is a vegetarian drinks Ensure to get in her protein.  Also eats impossible meats.  Denies headaches or blurred vision.  Only has dizziness or lightheadedness when her blood pressure is off.   Current HTN meds: amlodipine 5mg  daily, losartan 50mg  daily, metoprolol succinate 25mg  daily Previously tried: amlodipine 10mg  daily (edema), quinapril,  BP goal: <130/80  Family History:  Family History  Problem Relation Age of Onset   Heart disease Mother    Heart disease Father    Heart disease Other    Stroke Neg Hx    Cancer Neg Hx    Colon cancer Neg Hx     Social History: no tobacco, no ETOH  Diet:  Breakfast:  ensure or cereal Supper: vegetables, meat alternatives , another ensure Vegetarian Snack: unsalted saltine crackers  Exercise:  Water aerobics 2x week and 1 day she has swim lessons   Home BP readings:  140-160/90-100  Wt Readings from Last 3 Encounters:  07/16/22 170 lb (77.1 kg)  11/06/21 165 lb (74.8 kg)  05/01/21 166 lb 12.8 oz (75.7 kg)   BP Readings from Last 3 Encounters:  08/10/22 (!) 140/90  07/16/22 (!) 168/100  11/06/21 (!) 159/87   Pulse Readings from Last 3 Encounters:  08/10/22 96  07/16/22 68  11/06/21 77    Renal function: CrCl cannot be calculated (Patient's most recent lab result is older than the maximum 21 days allowed.).  Past Medical History:  Diagnosis Date   Acid reflux    Back pain    HLD (hyperlipidemia)    Hypertension    Insomnia    Migraine    Osteoarthritis    knee   Psychosis (HCC)    followed by Dr. Harrison Mons [phone]   Thyroid nodule 2010   TSH 04/2008 = 0.296, no follow up noted in  [facility]   Vertigo     Current Outpatient Medications on File Prior to Visit  Medication Sig Dispense Refill   amLODipine (NORVASC) 5 MG tablet TAKE 1 TABLET BY MOUTH EVERY DAY 90 tablet 0   diclofenac Sodium (VOLTAREN) 1 % GEL Apply topically as needed.     DULoxetine (CYMBALTA) 60 MG capsule Take by mouth daily.     metoprolol succinate (TOPROL-XL) 25 MG 24 hr tablet Take 1 tablet (25 mg total) by mouth daily. 90 tablet 0   mirtazapine (REMERON) 15 MG tablet Take 15 mg by mouth at bedtime.     celecoxib (CELEBREX) 200 MG capsule Take 200 mg by mouth daily. (Patient not taking: Reported on 08/10/2022)     cyclobenzaprine (FLEXERIL) 10 MG tablet Take by mouth as needed for pain.     hydrOXYzine (ATARAX/VISTARIL) 10 MG tablet Take 10 mg by mouth.      levothyroxine (SYNTHROID, LEVOTHROID) 25 MCG tablet Take 25 mcg by mouth daily before breakfast.     omeprazole (PRILOSEC) 40 MG capsule Take 40 mg by mouth daily.     rosuvastatin (CRESTOR) 10 MG  tablet rosuvastatin 10 mg tablet  TAKE 1 TABLET BY MOUTH EVERYDAY AT BEDTIME     No current facility-administered medications on file prior to visit.    Allergies  Allergen Reactions   Cortisone Anaphylaxis   Ibuprofen    Lidocaine     REACTION: seizure  PT STATES THIS IS SECONDARY TO ORAL LIDOCAINE ONLY WHEN MIXED MYLANTA   Olanzapine     REACTION: unknown reaction    Blood pressure (!) 140/90, pulse 96, last menstrual period 03/27/1984, SpO2 92 %.   Assessment/Plan:    1. Hypertension -  Essential hypertension Assessment: Blood pressure is above goal of less than 130/80 Patient reports that she feels off when her blood pressure is less than 140.  We discussed that this may be her body not used to a lower blood pressure and assured her that this should improve after a few days.  If it does not and persist then we can discuss adjusting the goal I do sense that there is some anxiety component to her hypertension Is exercising 3 times a week.  I encouraged her to increase the frequency Discussed the importance of resistance training Discussed restricting sodium to less than 2 g/day.  Advised that she might want to check the sodium content of her meat alternatives as sometimes they are overly processed and high in sodium Patient mainly knows her medications and brand names not generics  Plan: Stop losartan Start irbesartan 150 mg daily Will attempt to lower blood pressure slowly as to avoid any side effects Continue amlodipine 5 mg daily.  Dose limited by lower extremity edema at 10 mg Follow-up in 4 weeks.  Patient will need BMP at next appointment I asked her to check her blood pressure once a day at home- reviewed proper technique Patient is to report her blood pressure readings and bring her log along with her home blood pressure cuff to next visit   Thank you  Olene Floss, Pharm.D, BCACP, BCPS, CPP [facility] A Division of [facility] [address], [place], [place] [address]  Phone: [phone]; Fax: [phone]

## 2022-08-10 NOTE — Assessment & Plan Note (Signed)
Assessment: Blood pressure is above goal of less than 130/80 Patient reports that she feels off when her blood pressure is less than 140.  We discussed that this may be her body not used to a lower blood pressure and assured her that this should improve after a few days.  If it does not and persist then we can discuss adjusting the goal I do sense that there is some anxiety component to her hypertension Is exercising 3 times a week.  I encouraged her to increase the frequency Discussed the importance of resistance training Discussed restricting sodium to less than 2 g/day.  Advised that she might want to check the sodium content of her meat alternatives as sometimes they are overly processed and high in sodium Patient mainly knows her medications and brand names not generics  Plan: Stop losartan Start irbesartan 150 mg daily Will attempt to lower blood pressure slowly as to avoid any side effects Continue amlodipine 5 mg daily.  Dose limited by lower extremity edema at 10 mg Follow-up in 4 weeks.  Patient will need BMP at next appointment I asked her to check her blood pressure once a day at home- reviewed proper technique Patient is to report her blood pressure readings and bring her log along with her home blood pressure cuff to next visit

## 2022-09-02 ENCOUNTER — Ambulatory Visit: Payer: Medicare Other | Attending: Internal Medicine | Admitting: Pharmacist

## 2022-09-02 VITALS — BP 128/70 | HR 77 | Wt 171.0 lb

## 2022-09-02 DIAGNOSIS — I10 Essential (primary) hypertension: Secondary | ICD-10-CM | POA: Insufficient documentation

## 2022-09-02 NOTE — Patient Instructions (Addendum)
Your blood pressure goal is < 130/41mmHg   Continue amlodipine 5mg  daily, irbesartan 150mg  daily, metoprolol succinate 25mg  daily. Make sure you rest 5 min before checking blood pressure and try to avoid checking first thing in the AM.  Important lifestyle changes to control high blood pressure  Intervention  Effect on the BP   Weight loss Weight loss is one of the most effective lifestyle changes for controlling blood pressure. If you're overweight or obese, losing even a small amount of weight can help reduce blood pressure.    Blood pressure can decrease by 1 millimeter of mercury (mmHg) with each kilogram (about 2.2 pounds) of weight lost.   Exercise regularly As a general goal, aim for 30 minutes of moderate physical activity every day.    Regular physical activity can lower blood pressure by 5 - 8 mmHg.   Eat a healthy diet Eat a diet rich in whole grains, fruits, vegetables, lean meat, and low-fat dairy products. Limit processed foods, saturated fat, and sweets.    A heart-healthy diet can lower high blood pressure by 10 mmHg.   Reduce salt (sodium) in your diet Aim for 000mg  of sodium each day. Avoid deli meats, canned food, and frozen microwave meals which are high in sodium.     Limiting sodium can reduce blood pressure by 5 mmHg.   Limit alcohol One drink equals 12 ounces of beer, 5 ounces of wine, or 1.5 ounces of 80-proof liquor.    Limiting alcohol to < 1 drink a day for women or < 2 drinks a day for men can help lower blood pressure by about 4 mmHg.   To check your pressure at home you will need to:   Sit up in a chair, with feet flat on the floor and back supported. Do not cross your ankles or legs. Rest your left arm so that the cuff is about heart level. If the cuff goes on your upper arm, then just relax your arm on the table, arm of the chair, or your lap. If you have a wrist cuff, hold your wrist against your chest at heart level. Place the cuff snugly  around your arm, about 1 inch above the crease of your elbow. The cords should be inside the groove of your elbow.  Sit quietly, with the cuff in place, for about 5 minutes. Then press the power button to start a reading. Do not talk or move while the reading is taking place.  Record your readings on a sheet of paper. Although most cuffs have a memory, it is often easier to see a pattern developing when the numbers are all in front of you.  You can repeat the reading after 1-3 minutes if it is recommended.   Make sure your bladder is empty and you have not had caffeine or tobacco within the last 30 minutes   Always bring your blood pressure log with you to your appointments. If you have not brought your monitor in to be double checked for accuracy, please bring it to your next appointment.   You can find a list of validated (accurate) blood pressure cuffs at: validatebp.org

## 2022-09-02 NOTE — Assessment & Plan Note (Signed)
Assessment: Blood pressure well controlled in clinic Home readings are above goal, but technique was not correct Reviewed proper technique with patient No s/sx of low BP  Plan: Continue amlodipine 5mg  daily, irbesartan 150mg  daily, metoprolol succinate 25mg  daily Continue checking blood pressure daily- not first thing in the AM and after resting 5 min Follow up in clinic in 4 week

## 2022-09-02 NOTE — Progress Notes (Signed)
Patient ID: Doris [name]                 DOB: 1946-01-15                      MRN: [id number]      HPI: Bailey Faiella is a 77 y.o. female referred by Dr. Anne Fu to HTN clinic. PMH is significant for HTN, acid reflux, OA, HDL and CAD seen on CT. Seen in ER in Jan for hypertensive urgency. Her amlodipine was increased to 10mg  but this caused edema. Seen by Dr. Anne Fu on 5/31. BP in office was 168/100.  At visit with pharmD on 6/25 blood pressure was 140/90. Patient reported that she feels off when her blood pressure is less than 140. We discussed that this may be her body not used to a lower blood pressure and assured her that this should improve after a few days. If it does not and persist then we can discuss adjusting the goal. Losartan was stopped and irbesartan 150mg  daily was started. She was encouraged to increase her exercise.   Patient presents today for follow up. She brings in her home BP cuff- relion. Found to be accurate. She reports that she has been checking her Bp when she first gets up and is sitting on the side of her bed. I advised not to check first things in the AM and to check at her kitchen table, where her back is being supported and her feet are flat on the floor. Rest 5 min prior to checking. She reports only one episode of her head feeling funny. Her BP was not low. Denies any chest pain, SOB, headaches or blurred vision.  She request that her BP and weight be sent to her Psychiatrist Dr. Jonita Albee- [address]  Home BP cuff: Reli on 123/76 Clinic reading 126/74   Current HTN meds: amlodipine 5mg  daily, irbesartan 150mg  daily, metoprolol succinate 25mg  daily Previously tried: amlodipine 10mg  daily (edema), quinapril,  BP goal: <130/80  Family History:  Family History  Problem Relation Age of Onset   Heart disease Mother    Heart disease Father    Heart disease Other    Stroke Neg Hx    Cancer Neg Hx    Colon cancer Neg Hx     Social History: no tobacco, no  ETOH  Diet:  Breakfast: ensure or cereal Supper: vegetables, meat alternatives , another ensure Vegetarian Snack: unsalted saltine crackers  Exercise:  Water aerobics 2x week and 1 day she has swim lessons  Home BP readings:  145/100, 152/91, 139/80, 148/93, 152/95, 160/91, 160/90, 144/80, 147/81, 141/83, 123/84, 163/81 HR mainly 70's  Wt Readings from Last 3 Encounters:  09/02/22 171 lb (77.6 kg)  07/16/22 170 lb (77.1 kg)  11/06/21 165 lb (74.8 kg)   BP Readings from Last 3 Encounters:  09/02/22 128/70  08/10/22 (!) 140/90  07/16/22 (!) 168/100   Pulse Readings from Last 3 Encounters:  09/02/22 77  08/10/22 96  07/16/22 68    Renal function: CrCl cannot be calculated (Patient's most recent lab result is older than the maximum 21 days allowed.).  Past Medical History:  Diagnosis Date   Acid reflux    Back pain    HLD (hyperlipidemia)    Hypertension    Insomnia    Migraine    Osteoarthritis    knee   Psychosis (HCC)    followed by Dr. Harrison Mons [phone]   Thyroid nodule 2010  TSH 04/2008 = 0.296, no follow up noted in [facility]   Vertigo     Current Outpatient Medications on File Prior to Visit  Medication Sig Dispense Refill   amLODipine (NORVASC) 5 MG tablet TAKE 1 TABLET BY MOUTH EVERY DAY 90 tablet 0   celecoxib (CELEBREX) 200 MG capsule Take 200 mg by mouth daily. (Patient not taking: Reported on 08/10/2022)     cyclobenzaprine (FLEXERIL) 10 MG tablet Take by mouth as needed for pain.     diclofenac Sodium (VOLTAREN) 1 % GEL Apply topically as needed.     DULoxetine (CYMBALTA) 60 MG capsule Take by mouth daily.     hydrOXYzine (ATARAX/VISTARIL) 10 MG tablet Take 10 mg by mouth.      irbesartan (AVAPRO) 150 MG tablet Take 1 tablet (150 mg total) by mouth daily. 90 tablet 3   levothyroxine (SYNTHROID, LEVOTHROID) 25 MCG tablet Take 25 mcg by mouth daily before breakfast.     metoprolol succinate (TOPROL-XL) 25 MG 24 hr tablet Take 1 tablet (25 mg  total) by mouth daily. 90 tablet 0   mirtazapine (REMERON) 15 MG tablet Take 15 mg by mouth at bedtime.     omeprazole (PRILOSEC) 40 MG capsule Take 40 mg by mouth daily.     rosuvastatin (CRESTOR) 10 MG tablet rosuvastatin 10 mg tablet  TAKE 1 TABLET BY MOUTH EVERYDAY AT BEDTIME     No current facility-administered medications on file prior to visit.    Allergies  Allergen Reactions   Cortisone Anaphylaxis   Ibuprofen    Lidocaine     REACTION: seizure  PT STATES THIS IS SECONDARY TO ORAL LIDOCAINE ONLY WHEN MIXED MYLANTA   Olanzapine     REACTION: unknown reaction    Blood pressure 128/70, pulse 77, weight 171 lb (77.6 kg), last menstrual period 03/27/1984.   Assessment/Plan:     1. Hypertension -  Essential hypertension Assessment: Blood pressure well controlled in clinic Home readings are above goal, but technique was not correct Reviewed proper technique with patient No s/sx of low BP  Plan: Continue amlodipine 5mg  daily, irbesartan 150mg  daily, metoprolol succinate 25mg  daily Continue checking blood pressure daily- not first thing in the AM and after resting 5 min Follow up in clinic in 4 week    Thank you  Olene Floss, Pharm.D, BCACP, BCPS, CPP [facility] of [facility] [address] [place], [place] [address]  Phone: [phone]; Fax: [phone]

## 2022-09-29 NOTE — Progress Notes (Signed)
Patient ID: Doris [name]                 DOB: [birth year]                      MRN: [id number]      HPI: Doris [name] is a 77 y.o. female referred by Dr. Anne Fu to HTN clinic. PMH is significant for HTN, acid reflux, OA, HDL and CAD seen on CT. Seen in ER in Jan for hypertensive urgency. Her amlodipine was increased to 10mg  but this caused edema. Seen by Dr. Anne Fu on 5/31. BP in office was 168/100.  At visit with pharmD on 6/25 blood pressure was 140/90. Patient reported that she feels off when her blood pressure is less than 140. We discussed that this may be her body not used to a lower blood pressure and assured her that this should improve after a few days. If symptoms persist then we can discuss adjusting the goal. Losartan was stopped and irbesartan 150mg  daily was started. She was encouraged to increase her exercise.   At last visit with PharmD on 09/02/22 clinic BP reading was well controlled although home readings were elevated. She was counseled on proper BP check technique as she had been checking first thing in the morning when sitting on the side of the bed. Brought in home BP cuff which was found to be accurate. Amlodipine 5 mg, irbesartan 150 mg, and metoprolol succinate 25 mg were continued with no changes.  Patient presents today for follow up. Has been checking BP at home with proper techniqiue, resting beforehand, keeps arm at heart level, and checks later in the afternoon instead of first thing in the morning after waking up. However, has been taking irbesartan and amlodipine at varying times of the day, sometimes first thing in the morning and sometimes later in the day after exercising. Has also forgotten to take BP meds a couple times which corresponds with higher readings. Usually takes metoprolol at night. Reports worsening SOB over the past month that occurs at rest and with activity. Still doing water aerobics 2x/week and feels she is unable to hold her breath under water for  as long as previously. Denies lightheadedness that is worsened from baseline. Reports one episode of chest pain recently when she was getting into her car, symptoms resolved after resting in the seat for a few minutes. She does endorse anxiety today. Plans to talk with her psychiatrist about a different medication for her anxiety. Takes mirtazapine at night and has hydroxyzine PRN.   Current HTN meds: amlodipine 5mg  daily (AM), irbesartan 150mg  daily (AM), metoprolol succinate 25mg  daily (PM) Previously tried: amlodipine 10mg  daily (edema), quinapril BP goal: <130/80  Family History:  Family History  Problem Relation Age of Onset   Heart disease Mother    Heart disease Father    Heart disease Other    Stroke Neg Hx    Cancer Neg Hx    Colon cancer Neg Hx     Social History: no tobacco, no ETOH  Diet:  Breakfast: ensure or cereal Supper: vegetables, meat alternatives , another ensure Vegetarian Snack: unsalted saltine crackers  Exercise:  Water aerobics 2x week and 1 day she has swim lessons  Home BP readings:  144/76, 138/78, 148/74, 132/75, 112/71, 123/71, 136/79, 137/79, 131/80, 137/76, 130/69, 112/71, 142/81, 132/72, 152/87, 147/80, 148/81 Pulse: 73-97  Wt Readings from Last 3 Encounters:  09/02/22 171 lb (77.6 kg)  07/16/22 170 lb (77.1 kg)  11/06/21 165 lb (74.8 kg)   BP Readings from Last 3 Encounters:  09/30/22 138/80  09/02/22 128/70  08/10/22 (!) 140/90   Pulse Readings from Last 3 Encounters:  09/30/22 90  09/02/22 77  08/10/22 96    Renal function: CrCl cannot be calculated (Patient's most recent lab result is older than the maximum 21 days allowed.).  Past Medical History:  Diagnosis Date   Acid reflux    Back pain    HLD (hyperlipidemia)    Hypertension    Insomnia    Migraine    Osteoarthritis    knee   Psychosis (HCC)    followed by Dr. Harrison Mons [phone]   Thyroid nodule 2010   TSH 04/2008 = 0.296, no follow up noted in [facility]    Vertigo     Current Outpatient Medications on File Prior to Visit  Medication Sig Dispense Refill   amLODipine (NORVASC) 5 MG tablet TAKE 1 TABLET BY MOUTH EVERY DAY 90 tablet 0   celecoxib (CELEBREX) 200 MG capsule Take 200 mg by mouth daily. (Patient not taking: Reported on 08/10/2022)     cyclobenzaprine (FLEXERIL) 10 MG tablet Take by mouth as needed for pain.     diclofenac Sodium (VOLTAREN) 1 % GEL Apply topically as needed.     DULoxetine (CYMBALTA) 60 MG capsule Take by mouth daily.     hydrOXYzine (ATARAX/VISTARIL) 10 MG tablet Take 10 mg by mouth.      irbesartan (AVAPRO) 150 MG tablet Take 1 tablet (150 mg total) by mouth daily. 90 tablet 3   levothyroxine (SYNTHROID, LEVOTHROID) 25 MCG tablet Take 25 mcg by mouth daily before breakfast.     metoprolol succinate (TOPROL-XL) 25 MG 24 hr tablet Take 1 tablet (25 mg total) by mouth daily. 90 tablet 0   mirtazapine (REMERON) 15 MG tablet Take 15 mg by mouth at bedtime.     omeprazole (PRILOSEC) 40 MG capsule Take 40 mg by mouth daily.     rosuvastatin (CRESTOR) 10 MG tablet rosuvastatin 10 mg tablet  TAKE 1 TABLET BY MOUTH EVERYDAY AT BEDTIME     No current facility-administered medications on file prior to visit.    Allergies  Allergen Reactions   Cortisone Anaphylaxis   Ibuprofen    Lidocaine     REACTION: seizure  PT STATES THIS IS SECONDARY TO ORAL LIDOCAINE ONLY WHEN MIXED MYLANTA   Olanzapine     REACTION: unknown reaction    Blood pressure 138/80, pulse 90, last menstrual period 03/27/1984, SpO2 95%.   Assessment/Plan:  1. Hypertension - Uncontrolled based on BP in clinic today, although improving with some readings at goal according to home BP log. Suspect that inconsistent timing of administration of medications along with some missed doses is contributing to varying BP readings. Counseled her to take medications consistently around the same time every day to improve BP control. Anxiety is also likely  contributing to elevated blood pressure. We discussed breathing exercises/mindfulness techniques for her to try at home. She plans to follow up with psychiatrist regarding medication for her anxiety. Also recommended following up with PCP for SOB. Plan to continue amlodipine 5 mg daily, irbesartan 150 mg daily, and metoprolol succinate 50 mg daily. If BP elevated at next visit, can increase irbesartan to 300 mg daily. Follow up visit scheduled for 10/26/22.  Adam Phenix, PharmD PGY-1 Pharmacy Resident  Olene Floss, Pharm.D, BCACP, BCPS, CPP [facility] Division of [facility] [address] [place], [place] [address]  Phone: [phone]; Fax: [phone]

## 2022-09-30 ENCOUNTER — Telehealth: Payer: Self-pay | Admitting: Cardiology

## 2022-09-30 ENCOUNTER — Ambulatory Visit: Payer: Medicare Other | Attending: Cardiovascular Disease | Admitting: Pharmacist

## 2022-09-30 VITALS — BP 138/80 | HR 90

## 2022-09-30 DIAGNOSIS — I10 Essential (primary) hypertension: Secondary | ICD-10-CM | POA: Insufficient documentation

## 2022-09-30 NOTE — Telephone Encounter (Signed)
New message  Patient had a pharmd appointment today and stopped by checkout to schedule an appt.  She is a patient of Dr Anne Fu.  Patient c/o sob and occasional chest pain for 3 weeks.  I made her an appt for next Thursday with an APP but told patient a nurse would call her to see if anything else need to be done.

## 2022-09-30 NOTE — Patient Instructions (Addendum)
Your blood pressure goal is < 130/65mmHg   Continue amlodipine 5mg  daily, irbesartan 150mg  daily, metoprolol succinate 25mg  daily  Please take your medications at a consistent time each day  Important lifestyle changes to control high blood pressure  Intervention  Effect on the BP   Weight loss Weight loss is one of the most effective lifestyle changes for controlling blood pressure. If you're overweight or obese, losing even a small amount of weight can help reduce blood pressure.    Blood pressure can decrease by 1 millimeter of mercury (mmHg) with each kilogram (about 2.2 pounds) of weight lost.   Exercise regularly As a general goal, aim for 30 minutes of moderate physical activity every day.    Regular physical activity can lower blood pressure by 5 - 8 mmHg.   Eat a healthy diet Eat a diet rich in whole grains, fruits, vegetables, lean meat, and low-fat dairy products. Limit processed foods, saturated fat, and sweets.    A heart-healthy diet can lower high blood pressure by 10 mmHg.   Reduce salt (sodium) in your diet Aim for 000mg  of sodium each day. Avoid deli meats, canned food, and frozen microwave meals which are high in sodium.     Limiting sodium can reduce blood pressure by 5 mmHg.   Limit alcohol One drink equals 12 ounces of beer, 5 ounces of wine, or 1.5 ounces of 80-proof liquor.    Limiting alcohol to < 1 drink a day for women or < 2 drinks a day for men can help lower blood pressure by about 4 mmHg.   To check your pressure at home you will need to:   Sit up in a chair, with feet flat on the floor and back supported. Do not cross your ankles or legs. Rest your left arm so that the cuff is about heart level. If the cuff goes on your upper arm, then just relax your arm on the table, arm of the chair, or your lap. If you have a wrist cuff, hold your wrist against your chest at heart level. Place the cuff snugly around your arm, about 1 inch above the  crease of your elbow. The cords should be inside the groove of your elbow.  Sit quietly, with the cuff in place, for about 5 minutes. Then press the power button to start a reading. Do not talk or move while the reading is taking place.  Record your readings on a sheet of paper. Although most cuffs have a memory, it is often easier to see a pattern developing when the numbers are all in front of you.  You can repeat the reading after 1-3 minutes if it is recommended.   Make sure your bladder is empty and you have not had caffeine or tobacco within the last 30 minutes   Always bring your blood pressure log with you to your appointments. If you have not brought your monitor in to be double checked for accuracy, please bring it to your next appointment.   You can find a list of validated (accurate) blood pressure cuffs at: validatebp.org

## 2022-09-30 NOTE — Telephone Encounter (Signed)
Called and spoke with patient who states she's had ongoing SOB which she has mentioned to Dr Anne Fu. She feels the SOB is worsened with the hot weather. States "I've had a bout of chest pain and I haven't had that in years." Says it occurred while getting in her car four days ago, hasn't recurred since. Described as L-sided, denies radiation of pain, no SOB/dizziness/nausea/diaphoresis. Feels like episode lasted "5 seconds, sharp pain." Has history of acid reflux disease and states this could have been a sharp gas pain, but she wasn't sure. Seen by PharmD clinic today and vitals stable. Asked that if episodes recur to call us back, but otherwise, no acute sounding problem ans she should keep upcoming appt with APP. Verbalized understanding.

## 2022-10-06 NOTE — Progress Notes (Unsigned)
Cardiology Office Note:  .   Date:  10/07/2022  ID:  Doris [name], DOB Jul 07, 1945, MRN [id number] PCP: Harvest Forest, MD  [facility] Providers Cardiologist:  Donato Schultz, MD {  History of Present Illness: .   Doris [name] is a 77 y.o. female with a past medical history of hypertension with prior ER visit for hypertensive urgency, GERD, back pain, hyperlipidemia, and thyroid nodule here for follow-up appointment.  History includes coronary artery calcification noted on prior CT scan, currently taking Crestor and lipids are checked by PCP.  Referred to hypertension clinic for better blood pressure control.  Mild left ventricular hypertrophy on echocardiogram which was thought to may be contributing to her shortness of breath.  Longstanding history of tachycardia on metoprolol and this has helped significantly.  Longstanding history of dyspnea, well-controlled.  She was doing well from a cardiovascular standpoint when she was last seen in the office by Dr. Anne Fu 07/16/2022   Today, she tells me that she had an episode of chest pain which was when she was getting into her car. It lasted a few minutes and she sat there and rested. She did not have any nitroglycerin to use a the time. She did have an echo about three years ago and this was reviewed with the patient. Her main issues has been BP control. She has been seeing Melissa for her BP and her metoprolol was increased to 50mg  daily. She has been taking only 25 and I do think she would benefit from the 50mg  given her HR and BP today. She has started a swimming class and she finds it difficult with her breathing but has been floating.    No edema, orthopnea, PND. Reports no palpitations.    ROS: Pertinent ROS in HPI  Studies Reviewed: .       Echo 07/06/19 IMPRESSIONS     1. Low normal to mildly reduced LV systolic function (EF 50); mild LVH;  grade 1 diastolic dysfunction.   2. Left ventricular ejection fraction, by  estimation, is 50 to 55%. The  left ventricle has low normal function. The left ventricle has no regional  wall motion abnormalities. There is mild left ventricular hypertrophy of  the basal and septal segments.  Left ventricular diastolic parameters are consistent with Grade I  diastolic dysfunction (impaired relaxation).   3. Right ventricular systolic function is normal. The right ventricular  size is normal. There is normal pulmonary artery systolic pressure.   4. The mitral valve is normal in structure. Trivial mitral valve  regurgitation. No evidence of mitral stenosis.   5. The aortic valve is tricuspid. Aortic valve regurgitation is not  visualized. Mild aortic valve sclerosis is present, with no evidence of  aortic valve stenosis.   6. The inferior vena cava is normal in size with greater than 50%  respiratory variability, suggesting right atrial pressure of 3 mmHg.   FINDINGS   Left Ventricle: Left ventricular ejection fraction, by estimation, is 50  to 55%. The left ventricle has low normal function. The left ventricle has  no regional wall motion abnormalities. The left ventricular internal  cavity size was normal in size.  There is mild left ventricular hypertrophy of the basal and septal  segments. Left ventricular diastolic parameters are consistent with Grade  I diastolic dysfunction (impaired relaxation).   Right Ventricle: The right ventricular size is normal. Right ventricular  systolic function is normal. There is normal pulmonary artery systolic  pressure. The tricuspid regurgitant velocity  is 2.42 m/s, and with an  assumed right atrial pressure of 3 mmHg,   the estimated right ventricular systolic pressure is 26.4 mmHg.   Left Atrium: Left atrial size was normal in size.   Right Atrium: Right atrial size was normal in size.   Pericardium: There is no evidence of pericardial effusion.   Mitral Valve: The mitral valve is normal in structure. Normal mobility of   the mitral valve leaflets. Trivial mitral valve regurgitation. No evidence  of mitral valve stenosis.   Tricuspid Valve: The tricuspid valve is normal in structure. Tricuspid  valve regurgitation is trivial. No evidence of tricuspid stenosis.   Aortic Valve: The aortic valve is tricuspid. Aortic valve regurgitation is  not visualized. Mild aortic valve sclerosis is present, with no evidence  of aortic valve stenosis.   Pulmonic Valve: The pulmonic valve was normal in structure. Pulmonic valve  regurgitation is not visualized. No evidence of pulmonic stenosis.   Aorta: The aortic root is normal in size and structure.   Venous: The inferior vena cava is normal in size with greater than 50%  respiratory variability, suggesting right atrial pressure of 3 mmHg.    Additional Comments: Low normal to mildly reduced LV systolic function (EF  50); mild LVH; grade 1 diastolic dysfunction.      Physical Exam:   VS:  BP (!) 142/88   Pulse 86   Ht 5\' 4"  (1.626 m)   Wt 172 lb 9.6 oz (78.3 kg)   LMP 03/27/1984   SpO2 96%   BMI 29.63 kg/m    Wt Readings from Last 3 Encounters:  10/07/22 172 lb 9.6 oz (78.3 kg)  09/02/22 171 lb (77.6 kg)  07/16/22 170 lb (77.1 kg)    GEN: Well nourished, well developed in no acute distress NECK: No JVD; No carotid bruits CARDIAC: RRR, no murmurs, rubs, gallops RESPIRATORY:  Clear to auscultation without rales, wheezing or rhonchi  ABDOMEN: Soft, non-tender, non-distended EXTREMITIES:  No edema; No deformity   ASSESSMENT AND PLAN: .   1.  Hypertension  -Slightly elevated here in the clinic, 142/88 -Increased her metoprolol to 50 mg daily -She will continue on amlodipine 5 mg daily and Avapro 150 mg daily, we have provided refills today -She will continue a low-sodium, heart healthy diet -She will continue to monitor blood pressure 2 hours after morning medications and keep track for her upcoming appointment with Pharm.D. on 9/10  2.   Hyperlipidemia -Continue Crestor 10 mg at night -She will need a repeat lipid panel when she is next here in the office or through her PCP  3.  Coronary artery calcifications -1 episode of chest pain since she was last seen in May by Dr. Anne Fu -We have given her a prescription of nitroglycerin today to take as needed -We have ordered an echocardiogram for further workup  4.  Dyspnea on exertion/can't catch her breath -Plan to update echocardiogram, last echo was 2021 with LVEF 50% and grade 1 DD, no valvular issues  5.  Tachycardia -Heart rate in the 80s today, will increase metoprolol to 50 mg daily -She is asked to keep track of her heart rate and blood pressure at home    Dispo: She can follow-up with pharmacy next month and with me in 3 months.  Signed, Sharlene Dory, PA-C

## 2022-10-07 ENCOUNTER — Ambulatory Visit: Payer: Medicare Other | Attending: Physician Assistant | Admitting: Physician Assistant

## 2022-10-07 ENCOUNTER — Encounter: Payer: Self-pay | Admitting: Physician Assistant

## 2022-10-07 VITALS — BP 142/88 | HR 86 | Ht 64.0 in | Wt 172.6 lb

## 2022-10-07 DIAGNOSIS — E785 Hyperlipidemia, unspecified: Secondary | ICD-10-CM | POA: Diagnosis present

## 2022-10-07 DIAGNOSIS — I2510 Atherosclerotic heart disease of native coronary artery without angina pectoris: Secondary | ICD-10-CM | POA: Insufficient documentation

## 2022-10-07 DIAGNOSIS — R000 Tachycardia, unspecified: Secondary | ICD-10-CM | POA: Diagnosis present

## 2022-10-07 DIAGNOSIS — I10 Essential (primary) hypertension: Secondary | ICD-10-CM | POA: Insufficient documentation

## 2022-10-07 DIAGNOSIS — I5189 Other ill-defined heart diseases: Secondary | ICD-10-CM | POA: Diagnosis present

## 2022-10-07 DIAGNOSIS — R0609 Other forms of dyspnea: Secondary | ICD-10-CM | POA: Insufficient documentation

## 2022-10-07 MED ORDER — METOPROLOL SUCCINATE ER 50 MG PO TB24: 50.0000 mg | ORAL_TABLET | Freq: Every day | ORAL | 3 refills | Status: AC

## 2022-10-07 MED ORDER — AMLODIPINE BESYLATE 5 MG PO TABS: 5.0000 mg | ORAL_TABLET | Freq: Every day | ORAL | 3 refills | Status: DC

## 2022-10-07 MED ORDER — NITROGLYCERIN 0.4 MG SL SUBL: 0.4000 mg | SUBLINGUAL_TABLET | SUBLINGUAL | 3 refills | Status: AC | PRN

## 2022-10-07 MED ORDER — ROSUVASTATIN CALCIUM 10 MG PO TABS: 10.0000 mg | ORAL_TABLET | Freq: Every day | ORAL | 3 refills | Status: DC

## 2022-10-07 NOTE — Patient Instructions (Addendum)
Medication Instructions:  Your physician has recommended you make the following change in your medication:   INCREASE the Metoprolol XL to 50 mg taking 1 daily  START Nitroglycerin 0.4 s/l tablets, use only as needed:   The proper use and anticipated side effects of nitroglycerine has been carefully explained.  If a single episode of chest pain is not relieved by one tablet, the patient will try another within 5 minutes; and if this doesn't relieve the pain, the patient is instructed to call 911 for transportation to an emergency department.    *If you need a refill on your cardiac medications before your next appointment, please call your pharmacy*   Lab Work: None ordered  If you have labs (blood work) drawn today and your tests are completely normal, you will receive your results only by: MyChart Message (if you have MyChart) OR A paper copy in the mail If you have any lab test that is abnormal or we need to change your treatment, we will call you to review the results.   Testing/Procedures: Your physician has requested that you have an echocardiogram. Echocardiography is a painless test that uses sound waves to create images of your heart. It provides your doctor with information about the size and shape of your heart and how well your heart's chambers and valves are working. This procedure takes approximately one hour. There are no restrictions for this procedure. Please do NOT wear cologne, perfume, aftershave, or lotions (deodorant is allowed). Please arrive 15 minutes prior to your appointment time.    Follow-Up: At [facility], you and your health needs are our priority.  As part of our continuing mission to provide you with exceptional heart care, we have created designated Provider Care Teams.  These Care Teams include your primary Cardiologist (physician) and Advanced Practice Providers (APPs -  Physician Assistants and Nurse Practitioners) who all work together to  provide you with the care you need, when you need it.  We recommend signing up for the patient portal called "MyChart".  Sign up information is provided on this After Visit Summary.  MyChart is used to connect with patients for Virtual Visits (Telemedicine).  Patients are able to view lab/test results, encounter notes, upcoming appointments, etc.  Non-urgent messages can be sent to your provider as well.   To learn more about what you can do with MyChart, go to [email or web address].    Your next appointment:   3 month(s)  Provider:   Jari Favre, PA-C         Other Instructions Your physician has requested that you regularly monitor and record your blood pressure readings at home. Please use the same machine at the same time of day to check your readings and record them to bring to your follow-up visit.   Please monitor blood pressures and keep a log of your readings and bring them with you when you see our Pharmacist, Melissa.    Make sure to check 2 hours after your medications.    AVOID these things for 30 minutes before checking your blood pressure: No Drinking caffeine. No Drinking alcohol. No Eating. No Smoking. No Exercising.   Five minutes before checking your blood pressure: Pee. Sit in a dining chair. Avoid sitting in a soft couch or armchair. Be quiet. Do not talk

## 2022-10-12 ENCOUNTER — Other Ambulatory Visit: Payer: Self-pay | Admitting: Family Medicine

## 2022-10-12 DIAGNOSIS — Z13820 Encounter for screening for osteoporosis: Secondary | ICD-10-CM

## 2022-10-26 ENCOUNTER — Ambulatory Visit: Payer: Medicare Other

## 2022-10-26 ENCOUNTER — Ambulatory Visit (HOSPITAL_COMMUNITY): Payer: Medicare Other | Attending: Internal Medicine | Admitting: Pharmacist

## 2022-10-26 ENCOUNTER — Ambulatory Visit (HOSPITAL_BASED_OUTPATIENT_CLINIC_OR_DEPARTMENT_OTHER): Payer: Medicare Other

## 2022-10-26 VITALS — BP 146/82 | HR 67

## 2022-10-26 DIAGNOSIS — R0609 Other forms of dyspnea: Secondary | ICD-10-CM

## 2022-10-26 DIAGNOSIS — E785 Hyperlipidemia, unspecified: Secondary | ICD-10-CM | POA: Insufficient documentation

## 2022-10-26 DIAGNOSIS — I5189 Other ill-defined heart diseases: Secondary | ICD-10-CM | POA: Insufficient documentation

## 2022-10-26 DIAGNOSIS — I10 Essential (primary) hypertension: Secondary | ICD-10-CM

## 2022-10-26 DIAGNOSIS — R000 Tachycardia, unspecified: Secondary | ICD-10-CM

## 2022-10-26 DIAGNOSIS — I2510 Atherosclerotic heart disease of native coronary artery without angina pectoris: Secondary | ICD-10-CM

## 2022-10-26 LAB — ECHOCARDIOGRAM COMPLETE
Area-P 1/2: 2.76 cm2
S' Lateral: 2.4 cm

## 2022-10-26 MED ORDER — IRBESARTAN 300 MG PO TABS: 300.0000 mg | ORAL_TABLET | Freq: Every day | ORAL | 3 refills | Status: DC

## 2022-10-26 NOTE — Patient Instructions (Signed)
Please increase irbesartan to 300mg  daily. You may take 2 of the 150mg  tablets until you run out Please continue amlodipine 5mg  daily and metoprolol succinate 50mg  daily Continue with exercise and continue checking blood pressure daily   Your blood pressure goal is < 130/53mmHg   Important lifestyle changes to control high blood pressure  Intervention  Effect on the BP   Weight loss Weight loss is one of the most effective lifestyle changes for controlling blood pressure. If you're overweight or obese, losing even a small amount of weight can help reduce blood pressure.    Blood pressure can decrease by 1 millimeter of mercury (mmHg) with each kilogram (about 2.2 pounds) of weight lost.   Exercise regularly As a general goal, aim for 30 minutes of moderate physical activity every day.    Regular physical activity can lower blood pressure by 5 - 8 mmHg.   Eat a healthy diet Eat a diet rich in whole grains, fruits, vegetables, lean meat, and low-fat dairy products. Limit processed foods, saturated fat, and sweets.    A heart-healthy diet can lower high blood pressure by 10 mmHg.   Reduce salt (sodium) in your diet Aim for 000mg  of sodium each day. Avoid deli meats, canned food, and frozen microwave meals which are high in sodium.     Limiting sodium can reduce blood pressure by 5 mmHg.   Limit alcohol One drink equals 12 ounces of beer, 5 ounces of wine, or 1.5 ounces of 80-proof liquor.    Limiting alcohol to < 1 drink a day for women or < 2 drinks a day for men can help lower blood pressure by about 4 mmHg.   To check your pressure at home you will need to:   Sit up in a chair, with feet flat on the floor and back supported. Do not cross your ankles or legs. Rest your left arm so that the cuff is about heart level. If the cuff goes on your upper arm, then just relax your arm on the table, arm of the chair, or your lap. If you have a wrist cuff, hold your wrist against  your chest at heart level. Place the cuff snugly around your arm, about 1 inch above the crease of your elbow. The cords should be inside the groove of your elbow.  Sit quietly, with the cuff in place, for about 5 minutes. Then press the power button to start a reading. Do not talk or move while the reading is taking place.  Record your readings on a sheet of paper. Although most cuffs have a memory, it is often easier to see a pattern developing when the numbers are all in front of you.  You can repeat the reading after 1-3 minutes if it is recommended.   Make sure your bladder is empty and you have not had caffeine or tobacco within the last 30 minutes   Always bring your blood pressure log with you to your appointments. If you have not brought your monitor in to be double checked for accuracy, please bring it to your next appointment.   You can find a list of validated (accurate) blood pressure cuffs at: validatebp.org

## 2022-10-26 NOTE — Progress Notes (Signed)
Patient ID: Doris [name]                 DOB: [birth year]                      MRN: [id number]      HPI: Doris [name] is a 77 y.o. female referred by Dr. Anne Fu to HTN clinic. PMH is significant for HTN, acid reflux, OA, HDL and CAD seen on CT. Seen in ER in Jan for hypertensive urgency. Her amlodipine was increased to 10mg  but this caused edema. Seen by Dr. Anne Fu on 5/31. BP in office was 168/100.  At visit with pharmD on 6/25 blood pressure was 140/90. Patient reported that she feels off when her blood pressure is less than 140. We discussed that this may be her body not used to a lower blood pressure and assured her that this should improve after a few days. If symptoms persist then we can discuss adjusting the goal. Losartan was stopped and irbesartan 150mg  daily was started. She was encouraged to increase her exercise.   At visit with PharmD on 09/02/22 clinic BP reading was well controlled although home readings were elevated. She was counseled on proper BP check technique as she had been checking first thing in the morning when sitting on the side of the bed. Brought in home BP cuff which was found to be accurate.   At last visit with PharmD, BP was 138/80. Patient was counseled on taking her medications at a consistent time. Anxiety thought to be contributing to her elevated blood pressure. No medication changes were made.  Seen by Jari Favre 8/22. Metoprolol was increased to 50mg  daily.  Patient presents today to HTN clinic. States she has been upset about her grandson fiance and her pre-eclampsia. Just gave birth to her second great grandchild. Blood pressures have been variable. Reports some hallucinations- working with psychiatrist. Still reporting trouble catching her breath. Said she spent the night with her daughter who said she stopped breathing while sleeping. Did not mention this to Tessa at her apt.     Current HTN meds: amlodipine 5mg  daily (AM), irbesartan 150mg  daily (AM),  metoprolol succinate 50mg  daily (PM) Previously tried: amlodipine 10mg  daily (edema), quinapril BP goal: <130/80  Family History:  Family History  Problem Relation Age of Onset   Heart disease Mother    Heart disease Father    Heart disease Other    Stroke Neg Hx    Cancer Neg Hx    Colon cancer Neg Hx     Social History: no tobacco, no ETOH  Diet:  Breakfast: ensure or cereal Supper: vegetables, meat alternatives , another ensure Vegetarian Snack: unsalted saltine crackers  Exercise:  Water aerobics 2x week and 1 day she has swim lessons  Home BP readings:  137/72, 138/77, 150/81, 151/88, 133/77, 143/86, 127/79, 138/77, 142/85   Wt Readings from Last 3 Encounters:  10/07/22 172 lb 9.6 oz (78.3 kg)  09/02/22 171 lb (77.6 kg)  07/16/22 170 lb (77.1 kg)   BP Readings from Last 3 Encounters:  10/07/22 (!) 142/88  09/30/22 138/80  09/02/22 128/70   Pulse Readings from Last 3 Encounters:  10/07/22 86  09/30/22 90  09/02/22 77    Renal function: CrCl cannot be calculated (Patient's most recent lab result is older than the maximum 21 days allowed.).  Past Medical History:  Diagnosis Date   Acid reflux    Back pain    HLD (hyperlipidemia)  Hypertension    Insomnia    Migraine    Osteoarthritis    knee   Psychosis (HCC)    followed by Dr. Harrison Mons [phone]   Thyroid nodule 2010   TSH 04/2008 = 0.296, no follow up noted in [facility]   Vertigo     Current Outpatient Medications on File Prior to Visit  Medication Sig Dispense Refill   amLODipine (NORVASC) 5 MG tablet Take 1 tablet (5 mg total) by mouth daily. 90 tablet 3   celecoxib (CELEBREX) 200 MG capsule Take 200 mg by mouth daily.     cyclobenzaprine (FLEXERIL) 10 MG tablet Take by mouth as needed for pain.     diclofenac Sodium (VOLTAREN) 1 % GEL Apply topically as needed.     DULoxetine (CYMBALTA) 60 MG capsule Take by mouth daily.     hydrOXYzine (ATARAX/VISTARIL) 10 MG tablet Take 10 mg by  mouth.      irbesartan (AVAPRO) 150 MG tablet Take 1 tablet (150 mg total) by mouth daily. 90 tablet 3   levothyroxine (SYNTHROID, LEVOTHROID) 25 MCG tablet Take 25 mcg by mouth daily before breakfast.     metoprolol succinate (TOPROL XL) 50 MG 24 hr tablet Take 1 tablet (50 mg total) by mouth daily. Take with or immediately following a meal. 90 tablet 3   mirtazapine (REMERON) 15 MG tablet Take 15 mg by mouth at bedtime.     nitroGLYCERIN (NITROSTAT) 0.4 MG SL tablet Place 1 tablet (0.4 mg total) under the tongue every 5 (five) minutes as needed for chest pain. 25 tablet 3   omeprazole (PRILOSEC) 40 MG capsule Take 40 mg by mouth daily.     rosuvastatin (CRESTOR) 10 MG tablet Take 1 tablet (10 mg total) by mouth daily. 90 tablet 3   No current facility-administered medications on file prior to visit.    Allergies  Allergen Reactions   Cortisone Anaphylaxis   Ibuprofen    Lidocaine     REACTION: seizure  PT STATES THIS IS SECONDARY TO ORAL LIDOCAINE ONLY WHEN MIXED MYLANTA   Olanzapine     REACTION: unknown reaction    Last menstrual period 03/27/1984.   Assessment/Plan:  1. Hypertension -  Uncontrolled based on BP in clinic today, although improving with some readings at goal according to home BP log. She reports she is taking her medications consistently BP improved upon rest, but still above goal Discussed increasing irbesartan to 300mg - initially hesitant but later more agreeable Leaving Sat to visit her [place] for a month near [place] Plans to ride his stationary bike for exercise  Plan: Increase irbesartan to 300mg  daily Continue amlodipine 5mg  daily and metoprolol succinate 50mg  daily Follow up in 1 month when pt is back in town- will need BMP   Crystal Ellwood D Karalina Tift, Pharm.D, BCACP, BCPS, CPP [facility] [address] [place], [place] [address]  Phone: [phone]; Fax: [phone]

## 2022-11-02 ENCOUNTER — Telehealth: Payer: Self-pay | Admitting: *Deleted

## 2022-11-02 ENCOUNTER — Other Ambulatory Visit: Payer: Self-pay | Admitting: Physician Assistant

## 2022-11-02 DIAGNOSIS — G4719 Other hypersomnia: Secondary | ICD-10-CM

## 2022-11-02 NOTE — Progress Notes (Signed)
At home sleep study ordered, will arrange f/u with Okey Regal to discuss further.   Sharlene Dory, PA-C

## 2022-11-02 NOTE — Telephone Encounter (Signed)
Call placed to pt regarding order for Itamar Sleep Sutdy.  Per pt, she is in [place] until 10/12-10/13 and she will call when she returns to set up a time to come and pick up the device / answer stop bang questions.   I will forward to Danielle Rankin, CMA, to have so when pt calls, she is aware.

## 2022-11-26 NOTE — Telephone Encounter (Signed)
Patient called to confirm she can pick up her sleep equipment on Monday, 10/14 when she has her pharmacist appointment.

## 2022-11-26 NOTE — Telephone Encounter (Signed)
**Note De-Identified Doris [name] Obfuscation** Ordering provider: Jari Favre, PA-c Associated diagnoses: Daytime sleepiness-G47.19 WatchPAT PA obtained on 11/26/2022 by Doris [name], Doris Formosa, LPN. Doris [name] [employer] website-No PA required for CPT Code: 16109 The patient will be notified of PIN ([id number]) on 11/29/2022 Doris [name] Notification Method: in person after her Coumadin office visit.  Phone note routed to covering staff for follow-up.

## 2022-11-26 NOTE — Telephone Encounter (Signed)
I s/w the pt and said that will be fine to set up Itamar sleep study Monday 10/14 when she is in the office for her coumadin appt. I will let the coumadin dept know that I will need to see the pt after they are done with her.   I will send to the sleep coordinator to see if we may be able to get approval for her set up Monday 11/29/22

## 2022-11-29 ENCOUNTER — Encounter (INDEPENDENT_AMBULATORY_CARE_PROVIDER_SITE_OTHER): Payer: Self-pay | Admitting: Cardiology

## 2022-11-29 ENCOUNTER — Ambulatory Visit: Payer: Medicare Other | Attending: Cardiovascular Disease | Admitting: Pharmacist

## 2022-11-29 VITALS — BP 140/78 | HR 71

## 2022-11-29 DIAGNOSIS — G4733 Obstructive sleep apnea (adult) (pediatric): Secondary | ICD-10-CM | POA: Diagnosis not present

## 2022-11-29 DIAGNOSIS — I10 Essential (primary) hypertension: Secondary | ICD-10-CM | POA: Insufficient documentation

## 2022-11-29 NOTE — Progress Notes (Signed)
Patient ID: Doris [name]                 DOB: 1945/07/04                      MRN: [id number]      HPI: Doris [name] is a 77 y.o. female referred by Dr. Anne Fu to HTN clinic. PMH is significant for HTN, acid reflux, OA, HDL and CAD seen on CT. Seen in ER in Jan for hypertensive urgency. Her amlodipine was increased to 10mg  but this caused edema. Seen by Dr. Anne Fu on 5/31. BP in office was 168/100.  At visit with pharmD on 6/25 blood pressure was 140/90. Patient reported that she feels off when her blood pressure is less than 140. We discussed that this may be her body not used to a lower blood pressure and assured her that this should improve after a few days. If symptoms persist then we can discuss adjusting the goal. Losartan was stopped and irbesartan 150mg  daily was started. She was encouraged to increase her exercise.   At visit with PharmD on 09/02/22 clinic BP reading was well controlled although home readings were elevated. She was counseled on proper BP check technique as she had been checking first thing in the morning when sitting on the side of the bed. Brought in home BP cuff which was found to be accurate.   At visit with PharmD, BP was 138/80. Patient was counseled on taking her medications at a consistent time. Anxiety thought to be contributing to her elevated blood pressure. No medication changes were made.  Seen by Jari Favre 8/22. Metoprolol was increased to 50mg  daily. At last visit with PharmD, irbesartan was increase to 300mg  daily.   Patient presents today to clinic for follow-up. She has been at her son's house for the past month. Reports her daughter in law cooks with a lot of salt and they went out to eat a lot. BP was high while there. I think she also had a lot of anxiety over a snake and sleeping downstairs. She started doubling up on her amlodipine and metoprolol. Then was concerned because she was losing hair.  BP has been a little better since being back. She is  not doubling up on medications anymore. Has had some pain and wants some flexeril.    Current HTN meds: amlodipine 5mg  daily (AM), irbesartan 300mg  daily (AM), metoprolol succinate 50mg  daily (PM) Previously tried: amlodipine 10mg  daily (edema), quinapril BP goal: <130/80  Family History:  Family History  Problem Relation Age of Onset   Heart disease Mother    Heart disease Father    Heart disease Other    Stroke Neg Hx    Cancer Neg Hx    Colon cancer Neg Hx     Social History: no tobacco, no ETOH  Diet:  Breakfast: ensure or cereal Supper: vegetables, meat alternatives , another ensure Vegetarian Snack: unsalted saltine crackers  Exercise:  Water aerobics 2x week and 1 day she has swim lessons  Home BP readings:  169/92, 159/92, 138/81, 143/79, 185/96, 153/82, 173/96, 133/81  Wt Readings from Last 3 Encounters:  10/07/22 172 lb 9.6 oz (78.3 kg)  09/02/22 171 lb (77.6 kg)  07/16/22 170 lb (77.1 kg)   BP Readings from Last 3 Encounters:  10/26/22 (!) 146/82  10/07/22 (!) 142/88  09/30/22 138/80   Pulse Readings from Last 3 Encounters:  10/26/22 67  10/07/22 86  09/30/22 90  Renal function: CrCl cannot be calculated (Patient's most recent lab result is older than the maximum 21 days allowed.).  Past Medical History:  Diagnosis Date   Acid reflux    Back pain    HLD (hyperlipidemia)    Hypertension    Insomnia    Migraine    Osteoarthritis    knee   Psychosis (HCC)    followed by Dr. Harrison Mons [phone]   Thyroid nodule 2010   TSH 04/2008 = 0.296, no follow up noted in [facility]   Vertigo     Current Outpatient Medications on File Prior to Visit  Medication Sig Dispense Refill   amLODipine (NORVASC) 5 MG tablet Take 1 tablet (5 mg total) by mouth daily. 90 tablet 3   celecoxib (CELEBREX) 200 MG capsule Take 200 mg by mouth daily.     cyclobenzaprine (FLEXERIL) 10 MG tablet Take by mouth as needed for pain.     diclofenac Sodium (VOLTAREN) 1 %  GEL Apply topically as needed.     DULoxetine (CYMBALTA) 60 MG capsule Take by mouth daily.     hydrOXYzine (ATARAX/VISTARIL) 10 MG tablet Take 10 mg by mouth.      irbesartan (AVAPRO) 300 MG tablet Take 1 tablet (300 mg total) by mouth daily. 90 tablet 3   levothyroxine (SYNTHROID, LEVOTHROID) 25 MCG tablet Take 25 mcg by mouth daily before breakfast.     metoprolol succinate (TOPROL XL) 50 MG 24 hr tablet Take 1 tablet (50 mg total) by mouth daily. Take with or immediately following a meal. 90 tablet 3   mirtazapine (REMERON) 15 MG tablet Take 15 mg by mouth at bedtime.     nitroGLYCERIN (NITROSTAT) 0.4 MG SL tablet Place 1 tablet (0.4 mg total) under the tongue every 5 (five) minutes as needed for chest pain. 25 tablet 3   omeprazole (PRILOSEC) 40 MG capsule Take 40 mg by mouth daily.     rosuvastatin (CRESTOR) 10 MG tablet Take 1 tablet (10 mg total) by mouth daily. 90 tablet 3   No current facility-administered medications on file prior to visit.    Allergies  Allergen Reactions   Cortisone Anaphylaxis   Ibuprofen    Lidocaine     REACTION: seizure  PT STATES THIS IS SECONDARY TO ORAL LIDOCAINE ONLY WHEN MIXED MYLANTA   Olanzapine     REACTION: unknown reaction    Last menstrual period 03/27/1984.   Assessment/Plan:  1. Hypertension -  Assessment: Blood pressure above goal in clinic, but better than home readings she brought in I think her readings are being driven by anxiety She is pending at home sleep study Check BMP today since increasing irbesartan Hasn't been doing water areobic since she was out of town. New session doesn't start for a few weeks. Encouraged her to stay active.  Plan: Continue amlodipine 5mg  daily, metoprolol succinate 50mg  daily and irbesartan 300mg  daily Follow up in 1 month with Tessa Reminded patient to rest and take a few deep breaths before checking blood pressure.  BMP today Sleep study   Olene Floss, Pharm.D, BCACP, BCPS,  CPP [facility] A Division of [facility] [address], [place], [place] [address]  Phone: [phone]; Fax: [phone]

## 2022-11-29 NOTE — Patient Instructions (Addendum)
Your blood pressure goal is < 130/88mmHg   Continue amlodipine 5mg  daily (AM), irbesartan 300mg  daily (AM), metoprolol succinate 50mg  daily (PM) Please make sure you rest and take a few deep breaths before checking blood pressure Try to find some exercise to do between breaks with swimming  Important lifestyle changes to control high blood pressure  Intervention  Effect on the BP   Weight loss Weight loss is one of the most effective lifestyle changes for controlling blood pressure. If you're overweight or obese, losing even a small amount of weight can help reduce blood pressure.    Blood pressure can decrease by 1 millimeter of mercury (mmHg) with each kilogram (about 2.2 pounds) of weight lost.   Exercise regularly As a general goal, aim for 30 minutes of moderate physical activity every day.    Regular physical activity can lower blood pressure by 5 - 8 mmHg.   Eat a healthy diet Eat a diet rich in whole grains, fruits, vegetables, lean meat, and low-fat dairy products. Limit processed foods, saturated fat, and sweets.    A heart-healthy diet can lower high blood pressure by 10 mmHg.   Reduce salt (sodium) in your diet Aim for 000mg  of sodium each day. Avoid deli meats, canned food, and frozen microwave meals which are high in sodium.     Limiting sodium can reduce blood pressure by 5 mmHg.   Limit alcohol One drink equals 12 ounces of beer, 5 ounces of wine, or 1.5 ounces of 80-proof liquor.    Limiting alcohol to < 1 drink a day for women or < 2 drinks a day for men can help lower blood pressure by about 4 mmHg.   To check your pressure at home you will need to:   Sit up in a chair, with feet flat on the floor and back supported. Do not cross your ankles or legs. Rest your left arm so that the cuff is about heart level. If the cuff goes on your upper arm, then just relax your arm on the table, arm of the chair, or your lap. If you have a wrist cuff, hold your wrist  against your chest at heart level. Place the cuff snugly around your arm, about 1 inch above the crease of your elbow. The cords should be inside the groove of your elbow.  Sit quietly, with the cuff in place, for about 5 minutes. Then press the power button to start a reading. Do not talk or move while the reading is taking place.  Record your readings on a sheet of paper. Although most cuffs have a memory, it is often easier to see a pattern developing when the numbers are all in front of you.  You can repeat the reading after 1-3 minutes if it is recommended.   Make sure your bladder is empty and you have not had caffeine or tobacco within the last 30 minutes   Always bring your blood pressure log with you to your appointments. If you have not brought your monitor in to be double checked for accuracy, please bring it to your next appointment.   You can find a list of validated (accurate) blood pressure cuffs at: validatebp.org

## 2022-11-29 NOTE — Telephone Encounter (Signed)
Patient agreement reviewed and signed on 11/29/2022.  WatchPAT issued to patient on 11/29/2022 by Danielle Rankin, CMA. Patient aware to not open the WatchPAT box until contacted with the activation PIN. Patient profile initialized in CloudPAT on 11/29/2022 by Danielle Rankin, CMA. Device serial number: [id number]  Please list Reason for Call as Advice Only and type "WatchPAT issued to patient" in the comment box.  PT HAS BEEN APPROVED AND HAS BEEN GIVEN THE PIN# [id number] TODAY.    Patient Name:         DOB:       Height:     Weight:  Office Name:         Referring Provider:  Today's Date:  Date:   STOP BANG RISK ASSESSMENT S (snore) Have you been told that you snore?     YES   T (tired) Are you often tired, fatigued, or sleepy during the day?   YES  O (obstruction) Do you stop breathing, choke, or gasp during sleep? YES   P (pressure) Do you have or are you being treated for high blood pressure? YES   B (BMI) Is your body index greater than 35 kg/m? NO   A (age) Are you 78 years old or older? YES   N (neck) Do you have a neck circumference greater than 16 inches?   NO   G (gender) Are you a female? NO   TOTAL STOP/BANG "YES" ANSWERS 5                                                                       For Office Use Only              Procedure Order Form    YES to 3+ Stop Bang questions OR two clinical symptoms - patient qualifies for WatchPAT (CPT 95800)             Clinical Notes: Will consult Sleep Specialist and refer for management of therapy due to patient increased risk of Sleep Apnea. Ordering a sleep study due to the following two clinical symptoms: Excessive daytime sleepiness G47.10 / Gastroesophageal reflux K21.9 / Nocturia R35.1 / Morning Headaches G44.221 / Difficulty concentrating R41.840 / Memory problems or poor judgment G31.84 / Personality changes or irritability R45.4 / Loud snoring R06.83 / Depression F32.9 / Unrefreshed by sleep G47.8 / Impotence N52.9 /  History of high blood pressure R03.0 / Insomnia G47.00    I understand that I am proceeding with a home sleep apnea test as ordered by my treating physician. I understand that untreated sleep apnea is a serious cardiovascular risk factor and it is my responsibility to perform the test and seek management for sleep apnea. I will be contacted with the results and be managed for sleep apnea by a local sleep physician. I will be receiving equipment and further instructions from [facility]. I shall promptly ship back the equipment via the included mailing label. I understand my insurance will be billed for the test and as the patient I am responsible for any insurance related out-of-pocket costs incurred. I have been provided with written instructions and can call for additional video or telephonic instruction, with 24-hour availability of qualified personnel to answer any questions: Patient Help  Desk [phone].  Patient Signature ______________________________________________________   Date______________________ Patient Telemedicine Verbal Consent

## 2022-11-30 LAB — BASIC METABOLIC PANEL
BUN/Creatinine Ratio: 21 (ref 12–28)
BUN: 18 mg/dL (ref 8–27)
CO2: 27 mmol/L (ref 20–29)
Calcium: 10 mg/dL (ref 8.7–10.3)
Chloride: 100 mmol/L (ref 96–106)
Creatinine, Ser: 0.86 mg/dL (ref 0.57–1.00)
Glucose: 81 mg/dL (ref 70–99)
Potassium: 3.4 mmol/L — ABNORMAL LOW (ref 3.5–5.2)
Sodium: 142 mmol/L (ref 134–144)
eGFR: 70 mL/min/{1.73_m2} (ref >59)

## 2022-12-12 ENCOUNTER — Ambulatory Visit: Payer: Medicare Other | Attending: Physician Assistant

## 2022-12-12 DIAGNOSIS — G4719 Other hypersomnia: Secondary | ICD-10-CM

## 2022-12-12 NOTE — Procedures (Signed)
   SLEEP STUDY REPORT Patient Information Study Date: 11/29/2022 Patient Name: Doris [name] Patient ID: [id number] Birth Date: 16-Jul-1945 Age: 77 Gender: Female BMI: 29.0 (W=170 lb, H=5' 4'') Stopbang: 5 Referring Physician: Jari Favre, NP  TEST DESCRIPTION: Home sleep apnea testing was completed using the WatchPat, a Type 1 device, utilizing peripheral arterial tonometry (PAT), chest movement, actigraphy, pulse oximetry, pulse rate, body position and snore. AHI was calculated with apnea and hypopnea using valid sleep time as the denominator. RDI includes apneas, hypopneas, and RERAs. The data acquired and the scoring of sleep and all associated events were performed in accordance with the recommended standards and specifications as outlined in the AASM Manual for the Scoring of Sleep and Associated Events 2.2.0 (2015).  FINDINGS:  1. Severe Obstructive Sleep Apnea with AHI 29.9/hr.  2. Central Sleep Apnea with pAHIc 2.4/hr.  3. Oxygen desaturations as low as 76%.  4. Severe snoring was present. O2 sats were < 88% for 83.7 min.  5. Total sleep time was 8 hrs and 4 min.  6. 21.6% of total sleep time was spent in REM sleep.  7. Normal sleep onset latency at 10 min.  8. Normal REM sleep onset latency at 96 min.  9. Total awakenings were 17. 10. Arrhythmia detection:None.  DIAGNOSIS: Severe Obstructive Sleep Apnea (G47.33) Nocturnal Hypoxemia  RECOMMENDATIONS: 1. Clinical correlation of these findings is necessary. The decision to treat obstructive sleep apnea (OSA) is usually based on the presence of apnea symptoms or the presence of associated medical conditions such as Hypertension, Congestive Heart Failure, Atrial Fibrillation or Obesity. The most common symptoms of OSA are snoring, gasping for breath while sleeping, daytime sleepiness and fatigue. 2. Initiating apnea therapy is recommended given the presence of symptoms and/or associated conditions. Recommend proceeding  with one of the following:  a. Auto-CPAP therapy with a pressure range of 5-20cm H2O.  b. An oral appliance (OA) that can be obtained from certain dentists with expertise in sleep medicine. These are primarily of use in non-obese patients with mild and moderate disease.  c. An ENT consultation which may be useful to look for specific causes of obstruction and possible treatment options.  d. If patient is intolerant to PAP therapy, consider referral to ENT for evaluation for hypoglossal nerve stimulator. 3. Close follow-up is necessary to ensure success with CPAP or oral appliance therapy for maximum benefit . 4. A follow-up oximetry study on CPAP is recommended to assess the adequacy of therapy and determine the need for supplemental oxygen or the potential need for Bi-level therapy. An arterial blood gas to determine the adequacy of baseline ventilation and oxygenation should also be considered. 5. Healthy sleep recommendations include: adequate nightly sleep (normal 7-9 hrs/night), avoidance of caffeine after noon and alcohol near bedtime, and maintaining a sleep environment that is cool, dark and quiet. 6. Weight loss for overweight patients is recommended. Even modest amounts of weight loss can significantly improve the severity of sleep apnea. 7. Snoring recommendations include: weight loss where appropriate, side sleeping, and avoidance of alcohol before bed. 8. Operation of motor vehicle should be avoided when sleepy.  Signature: Armanda Magic, MD; [facility]; Diplomat, American Board of Sleep Medicine Electronically Signed: 12/12/2022 8:40:40 PM

## 2022-12-29 ENCOUNTER — Telehealth: Payer: Self-pay | Admitting: *Deleted

## 2022-12-29 DIAGNOSIS — G4733 Obstructive sleep apnea (adult) (pediatric): Secondary | ICD-10-CM

## 2022-12-29 DIAGNOSIS — G4719 Other hypersomnia: Secondary | ICD-10-CM

## 2022-12-29 DIAGNOSIS — I10 Essential (primary) hypertension: Secondary | ICD-10-CM

## 2022-12-29 NOTE — Telephone Encounter (Signed)
-----   Message from Armanda Magic sent at 12/12/2022  8:43 PM EDT ----- Please let patient know that they have sleep apnea.  Recommend therapeutic CPAP titration for treatment of patient's sleep disordered breathing.  If unable to perform an in lab titration then initiate ResMed auto CPAP from 4 to 15cm H2O with heated humidity and mask of choice and overnight pulse ox on CPAP.

## 2022-12-29 NOTE — Telephone Encounter (Signed)
The patient has been notified of the result and verbalized understanding.  All questions (if any) were answered. Latrelle Dodrill, CMA 12/29/2022 6:52 PM    WILL PRECERT TITRATION

## 2023-01-02 NOTE — Progress Notes (Unsigned)
Cardiology Office Note:  .   Date:  01/03/2023  ID:  Doris [name], DOB [birth year], MRN [id number] PCP: Harvest Forest, MD  [facility] Providers Cardiologist:  Donato Schultz, MD {  History of Present Illness: .   Doris [name] is a 77 y.o. female with a past medical history of hypertension with prior ER visit for hypertensive urgency, GERD, back pain, hyperlipidemia, and thyroid nodule here for follow-up appointment.  History includes coronary artery calcification noted on prior CT scan, currently taking Crestor and lipids are checked by PCP.  Referred to hypertension clinic for better blood pressure control.  Mild left ventricular hypertrophy on echocardiogram which was thought to may be contributing to her shortness of breath.  Longstanding history of tachycardia on metoprolol and this has helped significantly.  Longstanding history of dyspnea, well-controlled.  She was doing well from a cardiovascular standpoint when she was last seen in the office by Dr. Anne Fu 07/16/2022   She saw me 10/07/22, she tells me that she had an episode of chest pain which was when she was getting into her car. It lasted a few minutes and she sat there and rested. She did not have any nitroglycerin to use a the time. She did have an echo about three years ago and this was reviewed with the patient. Her main issues has been BP control. She has been seeing Melissa for her BP and her metoprolol was increased to 50mg  daily. She has been taking only 25 and I do think she would benefit from the 50mg  given her HR and BP today. She has started a swimming class and she finds it difficult with her breathing but has been floating.   Today, she has a  history of hypertension, presents with increased agitation and headaches, which she attributes to high blood pressure. She reports feeling her blood pressure rise during stressful interactions. She has been using nitroglycerin to manage these episodes, but sought  additional treatment from her psychologist, who prescribed Klonopin. The patient has been taking half a pill of Klonopin throughout the day, which she reports has been effective in managing her symptoms and lowering her blood pressure.  The patient also reports having a sleep study, during which she was told she was not breathing properly on her sleep study. This information caused significant distress and fear in the patient. She has been contacted about a follow-up in-lab sleep study, but this has not yet been scheduled.  Additionally, the patient has been experiencing some agitation during medical procedures, such as a recent mammogram, which she attributes to high blood pressure. She reports that her blood pressure has been improving with her current medication regimen.  Reports no shortness of breath nor dyspnea on exertion. Reports no chest pain, pressure, or tightness. No edema, orthopnea, PND. Reports no palpitations.   Discussed the use of AI scribe software for clinical note transcription with the patient, who gave verbal consent to proceed.   ROS: Pertinent ROS in HPI  Studies Reviewed: .       Echo 07/06/19 IMPRESSIONS     1. Low normal to mildly reduced LV systolic function (EF 50); mild LVH;  grade 1 diastolic dysfunction.   2. Left ventricular ejection fraction, by estimation, is 50 to 55%. The  left ventricle has low normal function. The left ventricle has no regional  wall motion abnormalities. There is mild left ventricular hypertrophy of  the basal and septal segments.  Left ventricular diastolic parameters are consistent with Grade  I  diastolic dysfunction (impaired relaxation).   3. Right ventricular systolic function is normal. The right ventricular  size is normal. There is normal pulmonary artery systolic pressure.   4. The mitral valve is normal in structure. Trivial mitral valve  regurgitation. No evidence of mitral stenosis.   5. The aortic valve is tricuspid.  Aortic valve regurgitation is not  visualized. Mild aortic valve sclerosis is present, with no evidence of  aortic valve stenosis.   6. The inferior vena cava is normal in size with greater than 50%  respiratory variability, suggesting right atrial pressure of 3 mmHg.   FINDINGS   Left Ventricle: Left ventricular ejection fraction, by estimation, is 50  to 55%. The left ventricle has low normal function. The left ventricle has  no regional wall motion abnormalities. The left ventricular internal  cavity size was normal in size.  There is mild left ventricular hypertrophy of the basal and septal  segments. Left ventricular diastolic parameters are consistent with Grade  I diastolic dysfunction (impaired relaxation).   Right Ventricle: The right ventricular size is normal. Right ventricular  systolic function is normal. There is normal pulmonary artery systolic  pressure. The tricuspid regurgitant velocity is 2.42 m/s, and with an  assumed right atrial pressure of 3 mmHg,   the estimated right ventricular systolic pressure is 26.4 mmHg.   Left Atrium: Left atrial size was normal in size.   Right Atrium: Right atrial size was normal in size.   Pericardium: There is no evidence of pericardial effusion.   Mitral Valve: The mitral valve is normal in structure. Normal mobility of  the mitral valve leaflets. Trivial mitral valve regurgitation. No evidence  of mitral valve stenosis.   Tricuspid Valve: The tricuspid valve is normal in structure. Tricuspid  valve regurgitation is trivial. No evidence of tricuspid stenosis.   Aortic Valve: The aortic valve is tricuspid. Aortic valve regurgitation is  not visualized. Mild aortic valve sclerosis is present, with no evidence  of aortic valve stenosis.   Pulmonic Valve: The pulmonic valve was normal in structure. Pulmonic valve  regurgitation is not visualized. No evidence of pulmonic stenosis.   Aorta: The aortic root is normal in size and  structure.   Venous: The inferior vena cava is normal in size with greater than 50%  respiratory variability, suggesting right atrial pressure of 3 mmHg.    Additional Comments: Low normal to mildly reduced LV systolic function (EF  50); mild LVH; grade 1 diastolic dysfunction.      Physical Exam:   VS:  BP (!) 152/80 (BP Location: Right Arm, Patient Position: Sitting, Cuff Size: Normal)   Pulse 83   Ht 5\' 2"  (1.575 m)   Wt 168 lb 12.8 oz (76.6 kg)   LMP 03/27/1984   SpO2 95%   BMI 30.87 kg/m    Wt Readings from Last 3 Encounters:  01/03/23 168 lb 12.8 oz (76.6 kg)  10/07/22 172 lb 9.6 oz (78.3 kg)  09/02/22 171 lb (77.6 kg)    GEN: Well nourished, well developed in no acute distress NECK: No JVD; No carotid bruits CARDIAC: RRR, no murmurs, rubs, gallops RESPIRATORY:  Clear to auscultation without rales, wheezing or rhonchi  ABDOMEN: Soft, non-tender, non-distended EXTREMITIES:  No edema; No deformity   ASSESSMENT AND PLAN: .     Hyperlipidemia -Continue Crestor 10 mg at night -She will need a repeat lipid panel when she is next here in the office or through her PCP  Coronary  artery calcifications -no chest pains -We have given her a prescription of nitroglycerin today to take as needed  Dyspnea on exertion/can't catch her breath -better, echo reviewed again with the patient  Hypertension Elevated blood pressure readings with associated symptoms of agitation and headache. Patient has been using nitroglycerin and Klonopin to manage symptoms. Blood pressure has been improving with this regimen. -Continue current regimen. -Prescribe hydralazine 25mg  PRN for systolic blood pressure >155. -Consider increasing metoprolol succinate if blood pressure remains consistently elevated.  Anxiety Patient reports agitation and has been using Klonopin and Remeron to manage symptoms. Reports improvement with this regimen. -Continue current regimen.  Sleep Apnea Patient reports a  recent sleep study with concerning results. Awaiting in-lab sleep study for further evaluation. -Coordinate in-lab sleep study. -Consider CPAP therapy pending results of in-lab sleep study.      Dispo: She can follow-up in 6 months with Dr. Mayford Knife.  Signed, Sharlene Dory, PA-C

## 2023-01-03 ENCOUNTER — Ambulatory Visit: Payer: Medicare Other | Attending: Physician Assistant | Admitting: Physician Assistant

## 2023-01-03 ENCOUNTER — Encounter: Payer: Self-pay | Admitting: Physician Assistant

## 2023-01-03 VITALS — BP 152/80 | HR 83 | Ht 62.0 in | Wt 168.8 lb

## 2023-01-03 DIAGNOSIS — R000 Tachycardia, unspecified: Secondary | ICD-10-CM

## 2023-01-03 DIAGNOSIS — E785 Hyperlipidemia, unspecified: Secondary | ICD-10-CM

## 2023-01-03 DIAGNOSIS — I2510 Atherosclerotic heart disease of native coronary artery without angina pectoris: Secondary | ICD-10-CM | POA: Diagnosis not present

## 2023-01-03 DIAGNOSIS — R0609 Other forms of dyspnea: Secondary | ICD-10-CM

## 2023-01-03 DIAGNOSIS — G4719 Other hypersomnia: Secondary | ICD-10-CM | POA: Diagnosis present

## 2023-01-03 DIAGNOSIS — I10 Essential (primary) hypertension: Secondary | ICD-10-CM | POA: Diagnosis present

## 2023-01-03 MED ORDER — HYDRALAZINE HCL 25 MG PO TABS: ORAL_TABLET | ORAL | 3 refills | Status: DC

## 2023-01-03 NOTE — Patient Instructions (Signed)
Medication Instructions:   START Hydralazine one (1) tablet by mouth ( 25 mg) three times daily as needed for BP > 155.   *If you need a refill on your cardiac medications before your next appointment, please call your pharmacy*   Lab Work:  None ordered.  If you have labs (blood work) drawn today and your tests are completely normal, you will receive your results only by: MyChart Message (if you have MyChart) OR A paper copy in the mail If you have any lab test that is abnormal or we need to change your treatment, we will call you to review the results.   Testing/Procedures:  Your physician has recommended that you have a sleep study. This test records several body functions during sleep, including: brain activity, eye movement, oxygen and carbon dioxide blood levels, heart rate and rhythm, breathing rate and rhythm, the flow of air through your mouth and nose, snoring, body muscle movements, and chest and belly movement. [facility] will call you to schedule.     Follow-Up: At [facility], you and your health needs are our priority.  As part of our continuing mission to provide you with exceptional heart care, we have created designated Provider Care Teams.  These Care Teams include your primary Cardiologist (physician) and Advanced Practice Providers (APPs -  Physician Assistants and Nurse Practitioners) who all work together to provide you with the care you need, when you need it.  We recommend signing up for the patient portal called "MyChart".  Sign up information is provided on this After Visit Summary.  MyChart is used to connect with patients for Virtual Visits (Telemedicine).  Patients are able to view lab/test results, encounter notes, upcoming appointments, etc.  Non-urgent messages can be sent to your provider as well.   To learn more about what you can do with MyChart, go to [email or web address].    Your next appointment:   6 month(s)  Provider:   Carolanne Grumbling, MD    Other Instructions  Your physician wants you to follow-up in: 6 months.  You will receive a reminder letter in the mail two months in advance. If you don't receive a letter, please call our office to schedule the follow-up appointment.

## 2023-02-07 ENCOUNTER — Ambulatory Visit: Payer: Medicare Other | Attending: Internal Medicine | Admitting: Pharmacist

## 2023-02-07 VITALS — BP 132/80 | HR 78

## 2023-02-07 DIAGNOSIS — I10 Essential (primary) hypertension: Secondary | ICD-10-CM | POA: Diagnosis present

## 2023-02-07 NOTE — Progress Notes (Unsigned)
Patient ID: Doris [name]                 DOB: 09-May-1945                      MRN: [id number]      HPI: Hettie Gallmeyer is a 77 y.o. female referred by Dr. Anne Fu to HTN clinic. PMH is significant for HTN, acid reflux, OA, HDL and CAD seen on CT. Seen in ER in Jan for hypertensive urgency. Her amlodipine was increased to 10mg  but this caused edema. Seen by Dr. Anne Fu on 5/31. BP in office was 168/100.  At visit with pharmD on 6/25 blood pressure was 140/90. Patient reported that she feels off when her blood pressure is less than 140. We discussed that this may be her body not used to a lower blood pressure and assured her that this should improve after a few days. If symptoms persist then we can discuss adjusting the goal. Losartan was stopped and irbesartan 150mg  daily was started. She was encouraged to increase her exercise.   At visit with PharmD on 09/02/22 clinic BP reading was well controlled although home readings were elevated. She was counseled on proper BP check technique as she had been checking first thing in the morning when sitting on the side of the bed. Brought in home BP cuff which was found to be accurate.   At visit with PharmD, BP was 138/80. Patient was counseled on taking her medications at a consistent time. Anxiety thought to be contributing to her elevated blood pressure. No medication changes were made.  Seen by Jari Favre 8/22. Metoprolol was increased to 50mg  daily. At visit 9/10 with PharmD, irbesartan was increase to 300mg  daily.   Seen by Jari Favre on 01/03/2023.  Hydralazine 25 mg 3 times daily as needed for systolic above 155 was started.  Patient presents today to clinic for follow-up.  Blood pressure readings mainly in the 140s systolic.  She states she has taken hydralazine a couple times.  Dizziness only when she stands up too fast.  Having some headaches but associates this with neck pain.  Has been trying to watch her salt intake.  Realized that her  impossible meat is high in sodium.  Thinks that she is getting her anxiety in check.  Still upset that her psychologist will not give her more clonazepam.  Current HTN meds: amlodipine 5mg  daily (AM), irbesartan 300mg  daily (AM), metoprolol succinate 50mg  daily (PM), hydralazine 25mg  TID if BP is >155 Previously tried: amlodipine 10mg  daily (edema), quinapril BP goal: <130/80  Family History:  Family History  Problem Relation Age of Onset   Heart disease Mother    Heart disease Father    Heart disease Other    Stroke Neg Hx    Cancer Neg Hx    Colon cancer Neg Hx     Social History: no tobacco, no ETOH  Diet:  Breakfast: ensure or cereal Supper: vegetables, meat alternatives , another ensure Vegetarian Snack: unsalted saltine crackers  Exercise:  Water aerobics 2x week and 1 day she has swim lessons  Home BP readings:  169/92, 159/92, 138/81, 143/79, 185/96, 153/82, 173/96, 133/81  Wt Readings from Last 3 Encounters:  01/03/23 168 lb 12.8 oz (76.6 kg)  10/07/22 172 lb 9.6 oz (78.3 kg)  09/02/22 171 lb (77.6 kg)   BP Readings from Last 3 Encounters:  01/03/23 (!) 152/80  11/29/22 (!) 140/78  10/26/22 (!) 146/82   Pulse  Readings from Last 3 Encounters:  01/03/23 83  11/29/22 71  10/26/22 67    Renal function: CrCl cannot be calculated (Patient's most recent lab result is older than the maximum 21 days allowed.).  Past Medical History:  Diagnosis Date   Acid reflux    Back pain    HLD (hyperlipidemia)    Hypertension    Insomnia    Migraine    Osteoarthritis    knee   Psychosis (HCC)    followed by Dr. Harrison Mons [phone]   Thyroid nodule 2010   TSH 04/2008 = 0.296, no follow up noted in [facility]   Vertigo     Current Outpatient Medications on File Prior to Visit  Medication Sig Dispense Refill   amLODipine (NORVASC) 5 MG tablet Take 1 tablet (5 mg total) by mouth daily. 90 tablet 3   celecoxib (CELEBREX) 200 MG capsule Take 200 mg by mouth daily.      clonazePAM (KLONOPIN) 0.5 MG tablet Take 0.25 mg by mouth 2 (two) times daily.     cyclobenzaprine (FLEXERIL) 10 MG tablet Take by mouth as needed for pain.     diclofenac Sodium (VOLTAREN) 1 % GEL Apply topically as needed.     DULoxetine (CYMBALTA) 60 MG capsule Take by mouth daily.     hydrALAZINE (APRESOLINE) 25 MG tablet Take one ( 1) tablet by mouth ( 25 mg) three times daily as needed for BP greater than 155. 90 tablet 3   hydrOXYzine (ATARAX/VISTARIL) 10 MG tablet Take 10 mg by mouth.      irbesartan (AVAPRO) 300 MG tablet Take 1 tablet (300 mg total) by mouth daily. 90 tablet 3   levothyroxine (SYNTHROID, LEVOTHROID) 25 MCG tablet Take 25 mcg by mouth daily before breakfast.     metoprolol succinate (TOPROL XL) 50 MG 24 hr tablet Take 1 tablet (50 mg total) by mouth daily. Take with or immediately following a meal. 90 tablet 3   mirtazapine (REMERON) 15 MG tablet Take 15 mg by mouth at bedtime.     nitroGLYCERIN (NITROSTAT) 0.4 MG SL tablet Place 1 tablet (0.4 mg total) under the tongue every 5 (five) minutes as needed for chest pain. 25 tablet 3   omeprazole (PRILOSEC) 40 MG capsule Take 40 mg by mouth daily.     rosuvastatin (CRESTOR) 10 MG tablet Take 1 tablet (10 mg total) by mouth daily. 90 tablet 3   No current facility-administered medications on file prior to visit.    Allergies  Allergen Reactions   Cortisone Anaphylaxis   Ibuprofen    Lidocaine     REACTION: seizure  PT STATES THIS IS SECONDARY TO ORAL LIDOCAINE ONLY WHEN MIXED MYLANTA   Olanzapine     REACTION: unknown reaction    Last menstrual period 03/27/1984.   Assessment/Plan:  1. Hypertension -  Assessment: Blood pressure above goal in clinic.  Improved on repeat. Home readings elevated Patient willing to take hydralazine more scheduled Anxiety certainly a component of her elevated blood pressure She was diagnosed with sleep apnea but yet to get her CPAP machine.  Will send a message to Veda Canning  for follow-up  Plan:  Start taking hydralazine 25 mg 3 times daily Continue amlodipine 5mg  daily, metoprolol succinate 50mg  daily and irbesartan 300mg  daily Follow up in 1 month    Jameson Tormey D Hershall Benkert, Pharm.D, BCACP, BCPS, CPP [facility] A [facility] of [facility] [address] [place], [place] [address]  Phone: [phone];  Fax: [phone]

## 2023-02-07 NOTE — Patient Instructions (Addendum)
Your blood pressure goal is < 130/26mmHg   Start taking hydralazine 25mg  three times a day (first thing in the AM, mid day and at bedtime) Continue amlodipine 5mg  daily, irbesartan 300mg  daily, metoprolol succinate 50mg  daily   Important lifestyle changes to control high blood pressure  Intervention  Effect on the BP   Weight loss Weight loss is one of the most effective lifestyle changes for controlling blood pressure. If you're overweight or obese, losing even a small amount of weight can help reduce blood pressure.    Blood pressure can decrease by 1 millimeter of mercury (mmHg) with each kilogram (about 2.2 pounds) of weight lost.   Exercise regularly As a general goal, aim for 30 minutes of moderate physical activity every day.    Regular physical activity can lower blood pressure by 5 - 8 mmHg.   Eat a healthy diet Eat a diet rich in whole grains, fruits, vegetables, lean meat, and low-fat dairy products. Limit processed foods, saturated fat, and sweets.    A heart-healthy diet can lower high blood pressure by 10 mmHg.   Reduce salt (sodium) in your diet Aim for 000mg  of sodium each day. Avoid deli meats, canned food, and frozen microwave meals which are high in sodium.     Limiting sodium can reduce blood pressure by 5 mmHg.   Limit alcohol One drink equals 12 ounces of beer, 5 ounces of wine, or 1.5 ounces of 80-proof liquor.    Limiting alcohol to < 1 drink a day for women or < 2 drinks a day for men can help lower blood pressure by about 4 mmHg.   To check your pressure at home you will need to:   Sit up in a chair, with feet flat on the floor and back supported. Do not cross your ankles or legs. Rest your left arm so that the cuff is about heart level. If the cuff goes on your upper arm, then just relax your arm on the table, arm of the chair, or your lap. If you have a wrist cuff, hold your wrist against your chest at heart level. Place the cuff snugly around  your arm, about 1 inch above the crease of your elbow. The cords should be inside the groove of your elbow.  Sit quietly, with the cuff in place, for about 5 minutes. Then press the power button to start a reading. Do not talk or move while the reading is taking place.  Record your readings on a sheet of paper. Although most cuffs have a memory, it is often easier to see a pattern developing when the numbers are all in front of you.  You can repeat the reading after 1-3 minutes if it is recommended.   Make sure your bladder is empty and you have not had caffeine or tobacco within the last 30 minutes   Always bring your blood pressure log with you to your appointments. If you have not brought your monitor in to be double checked for accuracy, please bring it to your next appointment.   You can find a list of validated (accurate) blood pressure cuffs at: validatebp.org

## 2023-02-07 NOTE — Telephone Encounter (Signed)
Patient asking about follow up for her CPAP

## 2023-02-25 NOTE — Telephone Encounter (Signed)
**Note De-Identified Kyreese Chio Obfuscation** Per the Medicare part A and B website: Medicare Part A and B do not require prior authorization for CPT code 04188.  I called the pt and made her aware that a CPAP Titration PA was not required per Medicare and that someone from the [facility] lab will be contacting her to schedule a date and time for her CPAP Titration. I did give her the sleep labs phone number so she can contact them if she has not gotten a call from them in a week or two.  She verbalized understanding and thanked me for calling her back.

## 2023-02-25 NOTE — Telephone Encounter (Signed)
 Reached out to patient and let her know that her titration is awaiting authorization. Patient is agreeable with treatment.

## 2023-03-17 ENCOUNTER — Ambulatory Visit (HOSPITAL_BASED_OUTPATIENT_CLINIC_OR_DEPARTMENT_OTHER): Payer: Medicare Other | Attending: Cardiology | Admitting: Cardiology

## 2023-03-17 VITALS — Ht 62.0 in | Wt 160.0 lb

## 2023-03-17 DIAGNOSIS — G4719 Other hypersomnia: Secondary | ICD-10-CM | POA: Insufficient documentation

## 2023-03-17 DIAGNOSIS — G4736 Sleep related hypoventilation in conditions classified elsewhere: Secondary | ICD-10-CM | POA: Insufficient documentation

## 2023-03-17 DIAGNOSIS — I10 Essential (primary) hypertension: Secondary | ICD-10-CM | POA: Insufficient documentation

## 2023-03-17 DIAGNOSIS — G4733 Obstructive sleep apnea (adult) (pediatric): Secondary | ICD-10-CM | POA: Diagnosis not present

## 2023-03-18 NOTE — Procedures (Signed)
   Patient Name: Doris [name], Doris [name] Date: 03/17/2023 Gender: Female D.O.B: Feb 01, 1946 Age (years): 51 Referring Provider: Armanda Magic MD, ABSM Height (inches): 62 Interpreting Physician: Armanda Magic MD, ABSM Weight (lbs): 160 RPSGT: Cherylann Parr BMI: 29 MRN: [id number] Neck Size: 13.50  CLINICAL INFORMATION The patient is referred for a CPAP titration to treat sleep apnea.  SLEEP STUDY TECHNIQUE As per the AASM Manual for the Scoring of Sleep and Associated Events v2.3 (April 2016) with a hypopnea requiring 4% desaturations.  The channels recorded and monitored were frontal, central and occipital EEG, electrooculogram (EOG), submentalis EMG (chin), nasal and oral airflow, thoracic and abdominal wall motion, anterior tibialis EMG, snore microphone, electrocardiogram, and pulse oximetry. Continuous positive airway pressure (CPAP) was initiated at the beginning of the study and titrated to treat sleep-disordered breathing.  MEDICATIONS Medications self-administered by patient taken the night of the study : N/A  TECHNICIAN COMMENTS Comments added by technician: Patient had difficulty initiating sleep. Comments added by scorer: N/A  RESPIRATORY PARAMETERS Optimal PAP Pressure (cm):12  AHI at Optimal Pressure (/hr):N/A Overall Minimal O2 (%):85.0  Supine % at Optimal Pressure (%):N/A Minimal O2 at Optimal Pressure (%): 85.0   SLEEP ARCHITECTURE The study was initiated at 10:32:40 PM and ended at 4:30:15 AM.  Sleep onset time was 19.6 minutes and the sleep efficiency was 82.5%. The total sleep time was 295 minutes.  The patient spent 3.2% of the night in stage N1 sleep, 91.4% in stage N2 sleep, 0.0% in stage N3 and 5.4% in REM.Stage REM latency was 311.5 minutes  Wake after sleep onset was 42.9. Alpha intrusion was absent. Supine sleep was 0.00%.  CARDIAC DATA The 2 lead EKG demonstrated sinus rhythm. The mean heart rate was 69.6 beats per minute. Other EKG findings  include: None.  LEG MOVEMENT DATA The total Periodic Limb Movements of Sleep (PLMS) were 0. The PLMS index was 0.0. A PLMS index of <15 is considered normal in adults.  IMPRESSIONS - The optimal PAP pressure was 12 cm of water. - Moderate oxygen desaturations were observed during this titration (min O2 = 85.0%). - No snoring was audible during this study. - No cardiac abnormalities were observed during this study. - Clinically significant periodic limb movements were not noted during this study. Arousals associated with PLMs were rare.  DIAGNOSIS - Obstructive Sleep Apnea (G47.33) - Nocturnal Hypoxemia  RECOMMENDATIONS - Trial of ResMed CPAP therapy on 12 cm H2O with a Small size Fisher&Paykel Full Face Vitera mask and heated humidification. - Avoid alcohol, sedatives and other CNS depressants that may worsen sleep apnea and disrupt normal sleep architecture. - Sleep hygiene should be reviewed to assess factors that may improve sleep quality. - Weight management and regular exercise should be initiated or continued. - Return to Sleep Center for re-evaluation after 4 weeks of therapy - Recommend ONO on CPAP to assess for ongoing nocturnal hypoxemia on CPAP  [Electronically signed] 03/18/2023 11:05 AM  Armanda Magic MD, ABSM Diplomate, American Board of Sleep Medicine

## 2023-03-21 ENCOUNTER — Ambulatory Visit: Payer: Medicare Other | Attending: Cardiology | Admitting: Pharmacist

## 2023-03-21 VITALS — BP 128/72 | HR 76

## 2023-03-21 DIAGNOSIS — I10 Essential (primary) hypertension: Secondary | ICD-10-CM | POA: Diagnosis not present

## 2023-03-21 NOTE — Progress Notes (Signed)
Patient ID: Doris [name]                 DOB: 06-14-1945                      MRN: [id number]      HPI: Doris [name] is a 78 y.o. female referred by Dr. Anne Fu to HTN clinic. PMH is significant for HTN, acid reflux, OA, HDL and CAD seen on CT. Seen in ER in Jan for hypertensive urgency. Her amlodipine was increased to 10mg  but this caused edema. Seen by Dr. Anne Fu on 5/31. BP in office was 168/100.  At visit with pharmD on 6/25 blood pressure was 140/90. Patient reported that she feels off when her blood pressure is less than 140. We discussed that this may be her body not used to a lower blood pressure and assured her that this should improve after a few days. If symptoms persist then we can discuss adjusting the goal. Losartan was stopped and irbesartan 150mg  daily was started. She was encouraged to increase her exercise.   At visit with PharmD on 09/02/22 clinic BP reading was well controlled although home readings were elevated. She was counseled on proper BP check technique as she had been checking first thing in the morning when sitting on the side of the bed. Brought in home BP cuff which was found to be accurate.   At visit with PharmD, BP was 138/80. Patient was counseled on taking her medications at a consistent time. Anxiety thought to be contributing to her elevated blood pressure. No medication changes were made.  Seen by Jari Favre 8/22. Metoprolol was increased to 50mg  daily. At visit 9/10 with PharmD, irbesartan was increase to 300mg  daily.   Seen by Jari Favre on 01/03/2023.  Hydralazine 25 mg 3 times daily as needed for systolic above 155 was started.  Patient seen in PharmD clinic 02/07/23. BP was variable at home. 132/80 in clinic. She was asked to take hydralazine scheduled instead of prn.   Patient presents today for follow-up.  She has been taking hydralazine in the afternoon and evening but has not been taking in the morning.  Reports her psychologist started her on  BuSpar and it is making her tired.  She is not doing as much exercise.  Does not like to go to the pool when it is cold outside.  Only exercise is walking to get the mail.  She states she is going to see when the gym is open for her to walk and plans to go back to water aerobics in April.  She brings in her home cuff concerned that it may not be accurate as she is getting high readings.  They do come down with a second or third reading.  Upon review patient is resting in her recliner and then getting up and walking to her bedroom to get her blood pressure cuff and walking back and then immediately checking her blood pressure.  I did check her blood pressure machine today and it was accurate.  She was educated to rest fully seated after placing her cuff on for 5 minutes before hitting the start button.   Home BP machine: 126/75 HR 78 Clinic reading :128/72   Current HTN meds: amlodipine 5mg  daily (AM), irbesartan 300mg  daily (AM), metoprolol succinate 50mg  daily (PM), hydralazine 25mg  TID Previously tried: amlodipine 10mg  daily (edema), quinapril BP goal: <130/80  Family History:  Family History  Problem Relation Age of Onset  Heart disease Mother    Heart disease Father    Heart disease Other    Stroke Neg Hx    Cancer Neg Hx    Colon cancer Neg Hx     Social History: no tobacco, no ETOH  Diet:  Breakfast: ensure or cereal Supper: vegetables, meat alternatives , another ensure Vegetarian Snack: unsalted saltine crackers  Exercise:  Walks to get the mail  Home BP readings:  Ranged from 130s to 160s  Wt Readings from Last 3 Encounters:  03/17/23 160 lb (72.6 kg)  01/03/23 168 lb 12.8 oz (76.6 kg)  10/07/22 172 lb 9.6 oz (78.3 kg)   BP Readings from Last 3 Encounters:  02/07/23 132/80  01/03/23 (!) 152/80  11/29/22 (!) 140/78   Pulse Readings from Last 3 Encounters:  02/07/23 78  01/03/23 83  11/29/22 71    Renal function: CrCl cannot be calculated (Patient's most  recent lab result is older than the maximum 21 days allowed.).  Past Medical History:  Diagnosis Date   Acid reflux    Back pain    HLD (hyperlipidemia)    Hypertension    Insomnia    Migraine    Osteoarthritis    knee   Psychosis (HCC)    followed by Dr. Harrison Mons [phone]   Thyroid nodule 2010   TSH 04/2008 = 0.296, no follow up noted in [facility]   Vertigo     Current Outpatient Medications on File Prior to Visit  Medication Sig Dispense Refill   amLODipine (NORVASC) 5 MG tablet Take 1 tablet (5 mg total) by mouth daily. 90 tablet 3   celecoxib (CELEBREX) 200 MG capsule Take 200 mg by mouth daily.     clonazePAM (KLONOPIN) 0.5 MG tablet Take 0.25 mg by mouth 2 (two) times daily.     cyclobenzaprine (FLEXERIL) 10 MG tablet Take by mouth as needed for pain.     diclofenac Sodium (VOLTAREN) 1 % GEL Apply topically as needed.     DULoxetine (CYMBALTA) 60 MG capsule Take by mouth daily.     hydrALAZINE (APRESOLINE) 25 MG tablet Take one ( 1) tablet by mouth ( 25 mg) three times daily     hydrOXYzine (ATARAX/VISTARIL) 10 MG tablet Take 10 mg by mouth.      irbesartan (AVAPRO) 300 MG tablet Take 1 tablet (300 mg total) by mouth daily. 90 tablet 3   levothyroxine (SYNTHROID, LEVOTHROID) 25 MCG tablet Take 25 mcg by mouth daily before breakfast.     metoprolol succinate (TOPROL XL) 50 MG 24 hr tablet Take 1 tablet (50 mg total) by mouth daily. Take with or immediately following a meal. 90 tablet 3   mirtazapine (REMERON) 15 MG tablet Take 15 mg by mouth at bedtime.     nitroGLYCERIN (NITROSTAT) 0.4 MG SL tablet Place 1 tablet (0.4 mg total) under the tongue every 5 (five) minutes as needed for chest pain. 25 tablet 3   omeprazole (PRILOSEC) 40 MG capsule Take 40 mg by mouth daily.     rosuvastatin (CRESTOR) 10 MG tablet Take 1 tablet (10 mg total) by mouth daily. 90 tablet 3   No current facility-administered medications on file prior to visit.    Allergies  Allergen Reactions    Cortisone Anaphylaxis   Ibuprofen    Lidocaine     REACTION: seizure  PT STATES THIS IS SECONDARY TO ORAL LIDOCAINE ONLY WHEN MIXED MYLANTA   Olanzapine     REACTION: unknown reaction    Last  menstrual period 03/27/1984.   Assessment/Plan:  1. Hypertension -  Assessment: Blood pressure at goal in clinic today  Home readings elevated but this may be due to poor technique Patient not taking hydralazine in the morning only in the afternoon and evening Anxiety certainly a component of her elevated blood pressure Completed CPAP titration but does not have home machine yet  Plan:  Start taking hydralazine 25 mg 3 times daily, 9 AM, 6 PM, bedtime midnight Continue amlodipine 5mg  daily, metoprolol succinate 50mg  daily and irbesartan 300mg  daily Increase physical activity Reviewed proper blood pressure checking technique again with patient Follow up in 1.5 month    Doris [name] Doris [name], Pharm.[name], BCACP, CPP [facility] [address] [place], [place] [address]  Phone: [phone]; Fax: [phone]

## 2023-03-21 NOTE — Patient Instructions (Addendum)
Your blood pressure goal is < 130/3mmHg   Please start taking your hydralazine at 9-10 AM, 5-6PM and at bedtime (12AM) Continue amlodipine 5mg  daily (AM), irbesartan 300mg  daily (AM), metoprolol succinate 50mg  daily (PM),  Please make sure you are seated in one place with your blood pressure cuff on before for 5 min you press the start button on your machine   Important lifestyle changes to control high blood pressure  Intervention  Effect on the BP   Weight loss Weight loss is one of the most effective lifestyle changes for controlling blood pressure. If you're overweight or obese, losing even a small amount of weight can help reduce blood pressure.    Blood pressure can decrease by 1 millimeter of mercury (mmHg) with each kilogram (about 2.2 pounds) of weight lost.   Exercise regularly As a general goal, aim for 30 minutes of moderate physical activity every day.    Regular physical activity can lower blood pressure by 5 - 8 mmHg.   Eat a healthy diet Eat a diet rich in whole grains, fruits, vegetables, lean meat, and low-fat dairy products. Limit processed foods, saturated fat, and sweets.    A heart-healthy diet can lower high blood pressure by 10 mmHg.   Reduce salt (sodium) in your diet Aim for 000mg  of sodium each day. Avoid deli meats, canned food, and frozen microwave meals which are high in sodium.     Limiting sodium can reduce blood pressure by 5 mmHg.   Limit alcohol One drink equals 12 ounces of beer, 5 ounces of wine, or 1.5 ounces of 80-proof liquor.    Limiting alcohol to < 1 drink a day for women or < 2 drinks a day for men can help lower blood pressure by about 4 mmHg.   To check your pressure at home you will need to:   Sit up in a chair, with feet flat on the floor and back supported. Do not cross your ankles or legs. Rest your left arm so that the cuff is about heart level. If the cuff goes on your upper arm, then just relax your arm on the table,  arm of the chair, or your lap. If you have a wrist cuff, hold your wrist against your chest at heart level. Place the cuff snugly around your arm, about 1 inch above the crease of your elbow. The cords should be inside the groove of your elbow.  Sit quietly, with the cuff in place, for about 5 minutes. Then press the power button to start a reading. Do not talk or move while the reading is taking place.  Record your readings on a sheet of paper. Although most cuffs have a memory, it is often easier to see a pattern developing when the numbers are all in front of you.  You can repeat the reading after 1-3 minutes if it is recommended.   Make sure your bladder is empty and you have not had caffeine or tobacco within the last 30 minutes   Always bring your blood pressure log with you to your appointments. If you have not brought your monitor in to be double checked for accuracy, please bring it to your next appointment.   You can find a list of validated (accurate) blood pressure cuffs at: validatebp.org

## 2023-03-23 ENCOUNTER — Telehealth: Payer: Self-pay

## 2023-03-23 DIAGNOSIS — I2510 Atherosclerotic heart disease of native coronary artery without angina pectoris: Secondary | ICD-10-CM

## 2023-03-23 DIAGNOSIS — G4733 Obstructive sleep apnea (adult) (pediatric): Secondary | ICD-10-CM

## 2023-03-23 DIAGNOSIS — G4719 Other hypersomnia: Secondary | ICD-10-CM

## 2023-03-23 DIAGNOSIS — I10 Essential (primary) hypertension: Secondary | ICD-10-CM

## 2023-03-23 DIAGNOSIS — E785 Hyperlipidemia, unspecified: Secondary | ICD-10-CM

## 2023-03-23 DIAGNOSIS — R0609 Other forms of dyspnea: Secondary | ICD-10-CM

## 2023-03-23 NOTE — Telephone Encounter (Signed)
-----   Message from Wilbert Bihari sent at 03/18/2023 11:07 AM EST ----- Please let patient know that they had a successful PAP titration and let DME know that orders are in EPIC.  Please set up 6 week OV with me. Needs ONO on CPAP to assess for ongoing nocturnal hypoxemia

## 2023-03-23 NOTE — Telephone Encounter (Signed)
Notified patient of Titration results and recommendations. All questions were answered and patient verbalized understanding. PAP order sent to [facility] 03/23/23.

## 2023-04-09 ENCOUNTER — Other Ambulatory Visit: Payer: Self-pay | Admitting: Physician Assistant

## 2023-04-26 ENCOUNTER — Encounter: Payer: Self-pay | Admitting: *Deleted

## 2023-05-05 ENCOUNTER — Telehealth: Payer: Self-pay | Admitting: Cardiology

## 2023-05-05 ENCOUNTER — Ambulatory Visit: Payer: Medicare Other | Attending: Cardiology | Admitting: Pharmacist

## 2023-05-05 VITALS — BP 128/82 | HR 76

## 2023-05-05 DIAGNOSIS — I10 Essential (primary) hypertension: Secondary | ICD-10-CM | POA: Insufficient documentation

## 2023-05-05 NOTE — Telephone Encounter (Signed)
 Patient checked in for her pharmacy appointment today and stated her primary care physician asked her to tell Dr. Mayford Knife "everything is going well with her CPAP machine and to make sure this is reported to the company."

## 2023-05-05 NOTE — Progress Notes (Signed)
 Patient ID: Doris [name]                 DOB: [birth year]                      MRN: [id number]      HPI: Doris [name] is a 78 y.o. female referred by Dr. Anne [name] to HTN clinic. PMH is significant for HTN, acid reflux, OA, HDL and CAD seen on CT. Seen in ER in Jan for hypertensive urgency. Her amlodipine was increased to 10mg  but this caused edema. Seen by Dr. Anne [name] on 5/31. BP in office was 168/100.  At visit with pharmD on 6/25 blood pressure was 140/90. Patient reported that she feels off when her blood pressure is less than 140. We discussed that this may be her body not used to a lower blood pressure and assured her that this should improve after a few days. If symptoms persist then we can discuss adjusting the goal. Losartan was stopped and irbesartan 150mg  daily was started. She was encouraged to increase her exercise.   At visit with PharmD on 09/02/22 clinic BP reading was well controlled although home readings were elevated. She was counseled on proper BP check technique as she had been checking first thing in the morning when sitting on the side of the bed. Brought in home BP cuff which was found to be accurate.   At visit with PharmD, BP was 138/80. Patient was counseled on taking her medications at a consistent time. Anxiety thought to be contributing to her elevated blood pressure. No medication changes were made.  Seen by Doris [name] 8/22. Metoprolol was increased to 50mg  daily. At visit 9/10 with PharmD, irbesartan was increase to 300mg  daily.   Seen by Doris [name] on 01/03/2023.  Hydralazine 25 mg 3 times daily as needed for systolic above 155 was started.  Patient seen in PharmD clinic 02/07/23. BP was variable at home. 132/80 in clinic. She was asked to take hydralazine scheduled instead of prn.   Patient last seen 04/07/2023.  Blood pressure in clinic was at goal, however her home readings were variable.  Unsure if this was due to improper technique or inconsistent use of  hydralazine.  She reported taking hydralazine in the afternoon and evening but not on the morning.  She was asked to start taking 3 times a day in the morning at 9 AM, 6 PM and then at bedtime. Her home cuff was found to be accurate. Educated to rest prior to checking.  Patient presents today for follow-up.  She admits that sometimes she does not take her hydralazine dose in the evening.  Sometimes does not go to bed till midnight and felt like that might be too late.  She does not take her morning meds until about 11 AM or noon.  Most of her blood pressure readings came at about 6 PM.  She takes her second hydralazine dose at 6 PM.  She was doing more exercise but that her plantar fasciitis started to flare.  Her daughter-in-law sent her some exercises to do that is helping.    She is very happy with the improvement in her blood pressures.  She has been wearing her CPAP and states that she feels great after wearing it.  Admits that sometimes she wakes up and does not put it back on.  Patient states that her durable medical equipment company needs a letter from Dr. Mayford [name] that she is doing well on CPAP.  Still with reports of shortness of breath on exertion and sometimes at rest.  This is not a new complaint however now reporting some feeling of chest tightness like something is pushing against her chest.  Echo in September showed normal heart function with thickness of the left ventricle and mildly impaired relaxation.  She does suffer from anxiety.  Psychiatrist gave her BuSpar but she is not taking all the time.  Educated that she needs to take on a regular basis for her to work.  I will send a message to Dr. Anne [name] letting him know that sometimes she does feel chest tightness.   Home blood pressure previously checked and found to be accurate.   Current HTN meds: amlodipine 5mg  daily (AM), irbesartan 300mg  daily (AM), metoprolol succinate 50mg  daily (PM), hydralazine 25mg  TID Previously tried:  amlodipine 10mg  daily (edema), quinapril BP goal: <130/80  Family History:  Family History  Problem Relation Age of Onset   Heart disease Mother    Heart disease Father    Heart disease Other    Stroke Neg Hx    Cancer Neg Hx    Colon cancer Neg Hx     Social History: no tobacco, no ETOH  Diet:  Breakfast: ensure or cereal Supper: vegetables, meat alternatives , another ensure Vegetarian Snack: unsalted saltine crackers  Exercise:  Walks to get the mail- had been walking more but her plantar fascitis flared up  Home BP readings:  132 70  133 78  136 75  139 79  158 77  152 75  147 78  142 74  138 78  135 70  132 76  135 76  142  76  155 77  140 77  152 79  135 66  128 72  152 78  134 65  140.85 74.8    Wt Readings from Last 3 Encounters:  03/17/23 160 lb (72.6 kg)  01/03/23 168 lb 12.8 oz (76.6 kg)  10/07/22 172 lb 9.6 oz (78.3 kg)   BP Readings from Last 3 Encounters:  03/21/23 128/72  02/07/23 132/80  01/03/23 (!) 152/80   Pulse Readings from Last 3 Encounters:  03/21/23 76  02/07/23 78  01/03/23 83    Renal function: CrCl cannot be calculated (Patient's most recent lab result is older than the maximum 21 days allowed.).  Past Medical History:  Diagnosis Date   Acid reflux    Back pain    HLD (hyperlipidemia)    Hypertension    Insomnia    Migraine    Osteoarthritis    knee   Psychosis (HCC)    followed by Dr. Harrison [name] [phone]   Thyroid nodule 2010   TSH 04/2008 = 0.296, no follow up noted in [facility]   Vertigo     Current Outpatient Medications on File Prior to Visit  Medication Sig Dispense Refill   amLODipine (NORVASC) 5 MG tablet Take 1 tablet (5 mg total) by mouth daily. 90 tablet 3   celecoxib (CELEBREX) 200 MG capsule Take 200 mg by mouth daily.     clonazePAM (KLONOPIN) 0.5 MG tablet Take 0.25 mg by mouth 2 (two) times daily.     cyclobenzaprine (FLEXERIL) 10 MG tablet Take by mouth as needed for pain.      diclofenac Sodium (VOLTAREN) 1 % GEL Apply topically as needed.     DULoxetine (CYMBALTA) 60 MG capsule Take by mouth daily.     hydrALAZINE (APRESOLINE) 25 MG tablet TAKE ONE ( 1) TABLET BY MOUTH (  25 MG) THREE TIMES DAILY AS NEEDED FOR BP GREATER THAN 155. 270 tablet 2   hydrOXYzine (ATARAX/VISTARIL) 10 MG tablet Take 10 mg by mouth.      irbesartan (AVAPRO) 300 MG tablet Take 1 tablet (300 mg total) by mouth daily. 90 tablet 3   levothyroxine (SYNTHROID, LEVOTHROID) 25 MCG tablet Take 25 mcg by mouth daily before breakfast.     metoprolol succinate (TOPROL XL) 50 MG 24 hr tablet Take 1 tablet (50 mg total) by mouth daily. Take with or immediately following a meal. 90 tablet 3   mirtazapine (REMERON) 15 MG tablet Take 15 mg by mouth at bedtime.     nitroGLYCERIN (NITROSTAT) 0.4 MG SL tablet Place 1 tablet (0.4 mg total) under the tongue every 5 (five) minutes as needed for chest pain. 25 tablet 3   omeprazole (PRILOSEC) 40 MG capsule Take 40 mg by mouth daily.     rosuvastatin (CRESTOR) 10 MG tablet Take 1 tablet (10 mg total) by mouth daily. 90 tablet 3   No current facility-administered medications on file prior to visit.    Allergies  Allergen Reactions   Cortisone Anaphylaxis   Ibuprofen    Lidocaine     REACTION: seizure  PT STATES THIS IS SECONDARY TO ORAL LIDOCAINE ONLY WHEN MIXED MYLANTA   Olanzapine     REACTION: unknown reaction    Last menstrual period 03/27/1984.   Assessment/Plan:  1. Hypertension -  Assessment: Blood pressure is close to goal in clinic today Home blood pressures have been varying, she does have several readings in the 130s over 70s with some in the 140s or 150s.  Averages out to be 140/78 Sometimes she is skipping evening dose of hydralazine therefore going from 6 PM until noon the next day without any additional hydralazine Wearing her CPAP and feeling much better I do see blood pressures improving  Plan:  Start taking hydralazine 25 mg 3  times daily, 9 AM, 6 PM, bedtime midnight Continue amlodipine 5mg  daily, metoprolol succinate 50mg  daily and irbesartan 300mg  daily Increase physical activity I have asked her to vary the time she checks her blood pressure Follow-up in 2 months   Ellon Marasco D Kyla Duffy, Pharm.D, BCACP, CPP [facility] of [facility] [address] [place], [place] [address]  Phone: [phone]; Fax: [phone]

## 2023-05-05 NOTE — Patient Instructions (Addendum)
 Your blood pressure goal is < 130/40mmHg   Continue amlodipine 5mg  daily, irbesartan 300mg  daily, metoprolol succinate 50mg  daily, hydralazine 25mg  at 11 AM, 6PM and bedtime  Make sure to take the bedtime dose of hydralazine Continue checking blood pressure at home. Vary the time you check it and write down the times Increase exercise  Important lifestyle changes to control high blood pressure  Intervention  Effect on the BP   Weight loss Weight loss is one of the most effective lifestyle changes for controlling blood pressure. If you're overweight or obese, losing even a small amount of weight can help reduce blood pressure.    Blood pressure can decrease by 1 millimeter of mercury (mmHg) with each kilogram (about 2.2 pounds) of weight lost.   Exercise regularly As a general goal, aim for 30 minutes of moderate physical activity every day.    Regular physical activity can lower blood pressure by 5 - 8 mmHg.   Eat a healthy diet Eat a diet rich in whole grains, fruits, vegetables, lean meat, and low-fat dairy products. Limit processed foods, saturated fat, and sweets.    A heart-healthy diet can lower high blood pressure by 10 mmHg.   Reduce salt (sodium) in your diet Aim for 000mg  of sodium each day. Avoid deli meats, canned food, and frozen microwave meals which are high in sodium.     Limiting sodium can reduce blood pressure by 5 mmHg.   Limit alcohol One drink equals 12 ounces of beer, 5 ounces of wine, or 1.5 ounces of 80-proof liquor.    Limiting alcohol to < 1 drink a day for women or < 2 drinks a day for men can help lower blood pressure by about 4 mmHg.   To check your pressure at home you will need to:   Sit up in a chair, with feet flat on the floor and back supported. Do not cross your ankles or legs. Rest your left arm so that the cuff is about heart level. If the cuff goes on your upper arm, then just relax your arm on the table, arm of the chair, or your  lap. If you have a wrist cuff, hold your wrist against your chest at heart level. Place the cuff snugly around your arm, about 1 inch above the crease of your elbow. The cords should be inside the groove of your elbow.  Sit quietly, with the cuff in place, for about 5 minutes. Then press the power button to start a reading. Do not talk or move while the reading is taking place.  Record your readings on a sheet of paper. Although most cuffs have a memory, it is often easier to see a pattern developing when the numbers are all in front of you.  You can repeat the reading after 1-3 minutes if it is recommended.   Make sure your bladder is empty and you have not had caffeine or tobacco within the last 30 minutes   Always bring your blood pressure log with you to your appointments. If you have not brought your monitor in to be double checked for accuracy, please bring it to your next appointment.   You can find a list of validated (accurate) blood pressure cuffs at: validatebp.org

## 2023-05-09 ENCOUNTER — Telehealth: Payer: Self-pay | Admitting: *Deleted

## 2023-05-09 ENCOUNTER — Encounter: Payer: Self-pay | Admitting: *Deleted

## 2023-05-09 DIAGNOSIS — R0609 Other forms of dyspnea: Secondary | ICD-10-CM

## 2023-05-09 DIAGNOSIS — R0789 Other chest pain: Secondary | ICD-10-CM

## 2023-05-09 DIAGNOSIS — Z01812 Encounter for preprocedural laboratory examination: Secondary | ICD-10-CM

## 2023-05-09 DIAGNOSIS — R072 Precordial pain: Secondary | ICD-10-CM

## 2023-05-09 MED ORDER — METOPROLOL TARTRATE 50 MG PO TABS: 50.0000 mg | ORAL_TABLET | ORAL | 0 refills | Status: DC

## 2023-05-09 NOTE — Telephone Encounter (Signed)
 Doris [name], lets go ahead and set her up for a coronary CT to further evaluate chest pain.   Thank you  Donato Schultz, MD   Spoke with pt who is aware of order above.  Reviewed instructions and she is aware I will send them to her through MyChart.  Aware to have BMP at her closest [facility].  Aware to take Metoprolol tartrate 2 hours before CT scan and this will be sent electronically to [facility]  She will call back if any questions or concerns.     Your cardiac CT will be scheduled at:   [facility] [address] [place], [place] [address] [phone]  ease arrive at the [facility] and Children's Entrance (Entrance C2) of [facility] 30 minutes prior to test start time. You can use the FREE valet parking offered at entrance C (encouraged to control the heart rate for the test)  Proceed to the [facility] Radiology Department (first floor) to check-in and test prep.  All radiology patients and guests should use entrance C2 at [facility], accessed from [facility], even though the hospital's physical address listed is [address]     Please follow these instructions carefully (unless otherwise directed):  An IV will be required for this test and Nitroglycerin will be given.  Hold all erectile dysfunction medications at least 3 days (72 hrs) prior to test. (Ie viagra, cialis, sildenafil, tadalafil, etc)   On the Night Before the Test: Be sure to Drink plenty of water. Do not consume any caffeinated/decaffeinated beverages or chocolate 12 hours prior to your test. Do not take any antihistamines 12 hours prior to your test.  On the Day of the Test: Drink plenty of water until 1 hour prior to the test. Do not eat any food 1 hour prior to test. You may take your regular medications prior to the test.  Take metoprolol (Lopressor) two hours prior to test. If you take Furosemide/Hydrochlorothiazide/Spironolactone/Chlorthalidone,  please HOLD on the morning of the test. Patients who wear a continuous glucose monitor MUST remove the device prior to scanning. FEMALES- please wear underwire-free bra if available, avoid dresses & tight clothing  After the Test: Drink plenty of water. After receiving IV contrast, you may experience a mild flushed feeling. This is normal. On occasion, you may experience a mild rash up to 24 hours after the test. This is not dangerous. If this occurs, you can take Benadryl 25 mg, Zyrtec, Claritin, or Allegra and increase your fluid intake. (Patients taking Tikosyn should avoid Benadryl, and may take Zyrtec, Claritin, or Allegra) If you experience trouble breathing, this can be serious. If it is severe call 911 IMMEDIATELY. If it is mild, please call our office.  We will call to schedule your test 2-4 weeks out understanding that some insurance companies will need an authorization prior to the service being performed.   For more information and frequently asked questions, please visit our website : [email or web address]  For non-scheduling related questions, please contact the cardiac imaging nurse navigator should you have any questions/concerns: Cardiac Imaging Nurse Navigators Direct Office Dial: [phone]   For scheduling needs, including cancellations and rescheduling, please call [place], [phone].   Orders placed and RX sent into pharmacy of choice. Instructions sent to pt via MyChart

## 2023-05-11 ENCOUNTER — Telehealth (HOSPITAL_COMMUNITY): Payer: Self-pay | Admitting: *Deleted

## 2023-05-11 NOTE — Telephone Encounter (Signed)
 Reaching out to patient to offer assistance regarding upcoming cardiac imaging study; pt verbalizes understanding of appt date/time, parking situation and where to check in, pre-test NPO status and medications ordered, and verified current allergies; name and call back number provided for further questions should they arise Johney Frame RN Navigator Cardiac Imaging [facility] [phone] office [phone] cell

## 2023-05-12 ENCOUNTER — Ambulatory Visit (HOSPITAL_COMMUNITY)
Admission: RE | Admit: 2023-05-12 | Discharge: 2023-05-12 | Disposition: A | Discharge status: Home / Self Care | Source: Ambulatory Visit | Attending: Cardiology | Admitting: Cardiology

## 2023-05-12 ENCOUNTER — Ambulatory Visit (HOSPITAL_BASED_OUTPATIENT_CLINIC_OR_DEPARTMENT_OTHER)
Admission: RE | Admit: 2023-05-12 | Discharge: 2023-05-12 | Disposition: A | Discharge status: Home / Self Care | Source: Ambulatory Visit | Attending: Cardiology | Admitting: Cardiology

## 2023-05-12 ENCOUNTER — Other Ambulatory Visit: Payer: Self-pay | Admitting: Cardiology

## 2023-05-12 DIAGNOSIS — I2510 Atherosclerotic heart disease of native coronary artery without angina pectoris: Secondary | ICD-10-CM

## 2023-05-12 DIAGNOSIS — R931 Abnormal findings on diagnostic imaging of heart and coronary circulation: Secondary | ICD-10-CM

## 2023-05-12 DIAGNOSIS — R072 Precordial pain: Secondary | ICD-10-CM

## 2023-05-12 MED ORDER — NITROGLYCERIN 0.4 MG SL SUBL
SUBLINGUAL_TABLET | SUBLINGUAL | Status: AC
  Filled 2023-05-12: qty 2

## 2023-05-12 MED ORDER — NITROGLYCERIN 0.4 MG SL SUBL: 0.8000 mg | SUBLINGUAL_TABLET | Freq: Once | SUBLINGUAL | Status: DC

## 2023-05-12 MED ORDER — IOHEXOL 350 MG/ML SOLN
95.0000 mL | Freq: Once | INTRAVENOUS | Status: AC | PRN
  Administered 2023-05-12: 95 mL via INTRAVENOUS

## 2023-05-13 ENCOUNTER — Encounter: Payer: Self-pay | Admitting: Cardiology

## 2023-05-13 NOTE — Telephone Encounter (Signed)
 Pt called in about results. She is having a hard time getting through [place], can someone call her with results please.

## 2023-05-13 NOTE — Telephone Encounter (Signed)
 Spoke with pt regarding her results. Pt plans to start ASA 81 mg over the counter and to continue taking Crestor. Pt was told to exercise regularly and to follow a Mediterranean diet (low fat, low carb). Pt told to reach out to [place] should he symptoms worsen. Pt verbalized understanding. All questions, if any, were answered.

## 2023-05-19 ENCOUNTER — Other Ambulatory Visit: Payer: Self-pay | Admitting: *Deleted

## 2023-05-19 MED ORDER — ASPIRIN 81 MG PO TBEC: 81.0000 mg | DELAYED_RELEASE_TABLET | Freq: Every day | ORAL | Status: AC

## 2023-06-24 ENCOUNTER — Encounter: Payer: Self-pay | Admitting: Cardiology

## 2023-06-24 ENCOUNTER — Ambulatory Visit: Attending: Cardiology | Admitting: Cardiology

## 2023-06-24 VITALS — BP 132/78 | HR 83 | Ht 62.0 in | Wt 166.8 lb

## 2023-06-24 DIAGNOSIS — I10 Essential (primary) hypertension: Secondary | ICD-10-CM | POA: Diagnosis not present

## 2023-06-24 DIAGNOSIS — G4733 Obstructive sleep apnea (adult) (pediatric): Secondary | ICD-10-CM

## 2023-06-24 NOTE — Progress Notes (Signed)
 Sleep Medicine CONSULT Note    Date:  06/24/2023   ID:  Doris [name], DOB [birth year], MRN [id number]  PCP:  Nohemi Batters, MD  Cardiologist: Dorothye Gathers, MD   Chief Complaint  Patient presents with   New Patient (Initial Visit)    Obstructive sleep apnea    History of Present Illness:  Doris [name] is a 78 y.o. female who is being seen today for the evaluation of OSA at the request of Dorothye Gathers, MD.  This is a 77yo female with a hx of GERD, HLD and HTN.  Apparently patient called in stating that she was having problems with feeling sleepy during the day with excessive daytime sleepiness and was told by her daughter that she snored loudly.  Home sleep study was ordered which demonstrated severe obstructive sleep apnea with an AHI of 29.9/h with no significant central events.  O2 saturations were as low as 76% and less than 88% for 84 minutes.  She underwent CPAP titration to 12 cm H2O.  She was fitted with a small [employer] fullface Vitera mask.  She is now referred for sleep medicine consultation to establish sleep care and treatment of obstructive sleep apnea.  She is doing well with her PAP device and thinks that she has gotten used to it.  She tolerates the nasal cushion mask and feels the pressure is adequate.  Since going on PAP she feels rested in the am and has no significant daytime sleepiness.  She denies any significant mouth or nasal dryness or nasal congestion.  She does not think that she snores.    Past Medical History:  Diagnosis Date   Acid reflux    Back pain    HLD (hyperlipidemia)    Hypertension    Insomnia    Migraine    OSA on CPAP    severe obstructive sleep apnea with an AHI of 29.9/h.  On CPAP at 10cm H2O   Osteoarthritis    knee   Psychosis (HCC)    followed by Dr. Tiny Forest [phone]   Thyroid  nodule 02/16/2008   TSH 04/2008 = 0.296, no follow up noted in [facility]   Vertigo     Past Surgical History:  Procedure Laterality  Date   TONSILLECTOMY     UTERINE FIBROID SURGERY      Current Medications: Current Meds  Medication Sig   amLODipine  (NORVASC ) 5 MG tablet Take 1 tablet (5 mg total) by mouth daily.   aspirin  EC 81 MG tablet Take 1 tablet (81 mg total) by mouth daily. Swallow whole.   busPIRone (BUSPAR) 7.5 MG tablet Take 7.5 mg by mouth at bedtime.   celecoxib (CELEBREX) 200 MG capsule Take 200 mg by mouth daily.   diclofenac Sodium (VOLTAREN) 1 % GEL Apply topically as needed.   DULoxetine (CYMBALTA) 60 MG capsule Take by mouth daily.   hydrALAZINE  (APRESOLINE ) 25 MG tablet TAKE ONE ( 1) TABLET BY MOUTH ( 25 MG) THREE TIMES DAILY AS NEEDED FOR BP GREATER THAN 155.   hydrOXYzine  (ATARAX /VISTARIL ) 10 MG tablet Take 10 mg by mouth.    irbesartan  (AVAPRO ) 300 MG tablet Take 1 tablet (300 mg total) by mouth daily.   levothyroxine (SYNTHROID, LEVOTHROID) 25 MCG tablet Take 25 mcg by mouth daily before breakfast.   metoprolol  succinate (TOPROL  XL) 50 MG 24 hr tablet Take 1 tablet (50 mg total) by mouth daily. Take with or immediately following a meal.   metoprolol  tartrate (LOPRESSOR ) 50 MG  tablet Take 1 tablet (50 mg total) by mouth as directed. Take one tablet 2 hours before your Coronary CT scan   mirtazapine (REMERON) 15 MG tablet Take 15 mg by mouth at bedtime.   omeprazole  (PRILOSEC) 40 MG capsule Take 40 mg by mouth daily.   rosuvastatin  (CRESTOR ) 10 MG tablet Take 1 tablet (10 mg total) by mouth daily.    Allergies:   Cortisone, Ibuprofen , Lidocaine , and Olanzapine   Social History   Socioeconomic History   Marital status: Divorced    Spouse name: Not on file   Number of children: Not on file   Years of education: Not on file   Highest education level: Not on file  Occupational History   Occupation: retired  Tobacco Use   Smoking status: Never   Smokeless tobacco: Never  Vaping Use   Vaping status: Never Used  Substance and Sexual Activity   Alcohol use: No    Comment: occ   Drug use:  No   Sexual activity: Yes  Other Topics Concern   Not on file  Social History Narrative   Not on file   Social Drivers of Health   Financial Resource Strain: Not on file  Food Insecurity: Not on file  Transportation Needs: Not on file  Physical Activity: Not on file  Stress: Not on file  Social Connections: Unknown (06/29/2021)   Received from [facility]   Social Network    Social Network: Not on file     Family History:  The patient's family history includes Heart disease in her father, mother, and another family member.   ROS:   Please see the history of present illness.    ROS All other systems reviewed and are negative.      No data to display             PHYSICAL EXAM:   VS:  BP 132/78   Pulse 83   Ht 5\' 2"  (1.575 m)   Wt 166 lb 12.8 oz (75.7 kg)   LMP 03/27/1984   SpO2 98%   BMI 30.51 kg/m    GEN: Well nourished, well developed, in no acute distress  HEENT: normal  Neck: no JVD, carotid bruits, or masses Cardiac: RRR; no murmurs, rubs, or gallops,no edema.  Intact distal pulses bilaterally.  Respiratory:  clear to auscultation bilaterally, normal work of breathing GI: soft, nontender, nondistended, + BS MS: no deformity or atrophy  Skin: warm and dry, no rash Neuro:  Alert and Oriented x 3, Strength and sensation are intact Psych: euthymic mood, full affect  Wt Readings from Last 3 Encounters:  06/24/23 166 lb 12.8 oz (75.7 kg)  03/17/23 160 lb (72.6 kg)  01/03/23 168 lb 12.8 oz (76.6 kg)      Studies/Labs Reviewed:   HST, CPAP titration, PAP compliance download  Recent Labs: 11/29/2022: BUN 18; Creatinine, Ser 0.86; Potassium 3.4; Sodium 142    Additional studies/ records that were reviewed today include:  none    ASSESSMENT:    1. OSA (obstructive sleep apnea)   2. Essential hypertension   3. OSA on CPAP      PLAN:  In order of problems listed above:  OSA - The patient is tolerating PAP therapy well  without any problems. The PAP download performed by his DME was personally reviewed and interpreted by me today and showed an AHI of 1.8/hr on 10 cm H2O with 77% compliance in using more than 4 hours nightly.  The patient has been using and benefiting from PAP use and will continue to benefit from therapy.   HTN -BP controlled -she says that her BP has improved since starting CPAP -continue Amlodipine  5mg  daily, Irbesartan  300mg  daily,  -take Hydralazine  25mg  TID PRN for SBP>155    Time Spent: 20 minutes total time of encounter, including 15 minutes spent in face-to-face patient care on the date of this encounter. This time includes coordination of care and counseling regarding above mentioned problem list. Remainder of non-face-to-face time involved reviewing chart documents/testing relevant to the patient encounter and documentation in the medical record. I have independently reviewed documentation from referring provider  Medication Adjustments/Labs and Tests Ordered: Current medicines are reviewed at length with the patient today.  Concerns regarding medicines are outlined above.  Medication changes, Labs and Tests ordered today are listed in the Patient Instructions below.  There are no Patient Instructions on file for this visit.   Signed, Gaylyn Keas, MD  06/24/2023 9:58 AM    [facility] [address] [place], [place], [place]  [address] Phone: [phone]; Fax: [phone]

## 2023-06-24 NOTE — Patient Instructions (Signed)
 Medication Instructions:  No changes *If you need a refill on your cardiac medications before your next appointment, please call your pharmacy*  Lab Work: none If you have labs (blood work) drawn today and your tests are completely normal, you will receive your results only by: MyChart Message (if you have MyChart) OR A paper copy in the mail If you have any lab test that is abnormal or we need to change your treatment, we will call you to review the results.  Testing/Procedures: none  Follow-Up: At [facility], you and your health needs are our priority.  As part of our continuing mission to provide you with exceptional heart care, our providers are all part of one team.  This team includes your primary Cardiologist (physician) and Advanced Practice Providers or APPs (Physician Assistants and Nurse Practitioners) who all work together to provide you with the care you need, when you need it.  Your next appointment:   12 month(s)  Provider:   Gaylyn Keas, MD    We recommend signing up for the patient portal called "MyChart".  Sign up information is provided on this After Visit Summary.  MyChart is used to connect with patients for Virtual Visits (Telemedicine).  Patients are able to view lab/test results, encounter notes, upcoming appointments, etc.  Non-urgent messages can be sent to your provider as well.   To learn more about what you can do with MyChart, go to [email or web address].   Other Instructions

## 2023-07-12 ENCOUNTER — Ambulatory Visit: Attending: Cardiology | Admitting: Pharmacist

## 2023-07-12 VITALS — BP 144/82 | HR 68

## 2023-07-12 DIAGNOSIS — I10 Essential (primary) hypertension: Secondary | ICD-10-CM

## 2023-07-12 NOTE — Patient Instructions (Addendum)
 Please try to take your medications at a more consistent time Try to go to bed and wake up generally around the same time each day Hydralazine  can be taken every 6-8 hours Continue amlodipine  5mg  daily (AM), irbesartan  300mg  daily (AM), metoprolol  succinate 50mg  daily (PM), hydralazine  25mg  three times a day  Continue to monitor blood pressure Continue to increase activity - add in some walking on the days you are not volunteering

## 2023-07-12 NOTE — Progress Notes (Signed)
 Patient ID: Doris [name]                 DOB: April 04, 1945                      MRN: [id number]      HPI: Doris [name] is a 78 y.o. female referred by Dr. Renna Cary to HTN clinic. PMH is significant for HTN, acid reflux, OA, HDL and CAD seen on CT. Seen in ER in Jan for hypertensive urgency. Her amlodipine  was increased to 10mg  but this caused edema. Seen by Dr. Renna Cary on 5/31. BP in office was 168/100.  At visit with pharmD on 6/25 blood pressure was 140/90. Patient reported that she feels off when her blood pressure is less than 140. We discussed that this may be her body not used to a lower blood pressure and assured her that this should improve after a few days. If symptoms persist then we can discuss adjusting the goal. Losartan  was stopped and irbesartan  150mg  daily was started. She was encouraged to increase her exercise.   At visit with PharmD on 09/02/22 clinic BP reading was well controlled although home readings were elevated. She was counseled on proper BP check technique as she had been checking first thing in the morning when sitting on the side of the bed. Brought in home BP cuff which was found to be accurate.   At visit with PharmD, BP was 138/80. Patient was counseled on taking her medications at a consistent time. Anxiety thought to be contributing to her elevated blood pressure. No medication changes were made.  Seen by Lovette Rud 8/22. Metoprolol  was increased to 50mg  daily. At visit 9/10 with PharmD, irbesartan  was increase to 300mg  daily.   Seen by Lovette Rud on 01/03/2023.  Hydralazine  25 mg 3 times daily as needed for systolic above 155 was started.  Patient seen in PharmD clinic 02/07/23. BP was variable at home. 132/80 in clinic. She was asked to take hydralazine  scheduled instead of prn.   Patient seen 04/07/2023.  Blood pressure in clinic was at goal, however her home readings were variable.  Unsure if this was due to improper technique or inconsistent use of  hydralazine .  She reported taking hydralazine  in the afternoon and evening but not on the morning.  She was asked to start taking 3 times a day in the morning at 9 AM, 6 PM and then at bedtime. Her home cuff was found to be accurate. Educated to rest prior to checking.  Patient last seen 05/05/23. She has started wearing CPAP and had seen improvement in her blood pressures. She was encouraged to take her hydralazine  3 times a day (9 AM, 6 PM, bedtime midnight). Saw Dr. Micael Adas 5/9. BP was 132/78.  Patient presents today for follow up. She has been volunteering at [employer] 2 days a week. 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 I encouraged her to find something else to keep her busy as it seems to motivate her to get up and move.   She brings in a good list of blood pressure readings. Range from 121/78-144/75. She says she feels better when her blood pressure is in the 140's. Does not take her medications at a consistent time. When she doesn't go to volunteer she takes her AM meds at 8-9 but the other days not until 11-12. Does not always take 3 doses  of hydralazine . Not doing much exercise, just the days she volunteers.   Current HTN meds: amlodipine  5mg  daily (AM), irbesartan  300mg  daily (AM), metoprolol  succinate 50mg  daily (PM), hydralazine  25mg  TID Previously tried: amlodipine  10mg  daily (edema), quinapril  BP goal: <130/80  Family History:  Family History  Problem Relation Age of Onset   Heart disease Mother    Heart disease Father    Heart disease Other    Stroke Neg Hx    Cancer Neg Hx    Colon cancer Neg Hx     Social History: no tobacco, no ETOH  Diet:  Breakfast: ensure or cereal Supper: vegetables, meat alternatives , another ensure Vegetarian Snack: unsalted saltine crackers  Exercise:  Walks to get the mail- had been walking more but her plantar fascitis flared up  Home BP readings:   144/75, 144/81, 136/73, 141/76, 130/75, 129/75, 144/75, 130/75, 134/81, 133/73, 132/75, 140/73, 132/77, 132/73, 124/76, 143/74, 134/78, 144/72, 144/73, 129/81, 126/80, 121/71, 145/75, 132/77, 124/78, 134/79, 140/85, 140/90, 138/82, 146/79 HR 60's-70's  Wt Readings from Last 3 Encounters:  06/24/23 166 lb 12.8 oz (75.7 kg)  03/17/23 160 lb (72.6 kg)  01/03/23 168 lb 12.8 oz (76.6 kg)   BP Readings from Last 3 Encounters:  06/24/23 132/78  05/12/23 117/69  05/05/23 128/82   Pulse Readings from Last 3 Encounters:  06/24/23 83  05/05/23 76  03/21/23 76    Renal function: CrCl cannot be calculated (Patient's most recent lab result is older than the maximum 21 days allowed.).  Past Medical History:  Diagnosis Date   Acid reflux    Back pain    HLD (hyperlipidemia)    Hypertension    Insomnia    Migraine    OSA on CPAP    severe obstructive sleep apnea with an AHI of 29.9/h.  On CPAP at 10cm H2O   Osteoarthritis    knee   Psychosis (HCC)    followed by Dr. Tiny Forest [phone]   Thyroid  nodule 02/16/2008   TSH 04/2008 = 0.296, no follow up noted in [facility]   Vertigo     Current Outpatient Medications on File Prior to Visit  Medication Sig Dispense Refill   amLODipine  (NORVASC ) 5 MG tablet Take 1 tablet (5 mg total) by mouth daily. 90 tablet 3   aspirin  EC 81 MG tablet Take 1 tablet (81 mg total) by mouth daily. Swallow whole.     busPIRone (BUSPAR) 7.5 MG tablet Take 7.5 mg by mouth at bedtime.     celecoxib (CELEBREX) 200 MG capsule Take 200 mg by mouth daily.     diclofenac Sodium (VOLTAREN) 1 % GEL Apply topically as needed.     DULoxetine (CYMBALTA) 60 MG capsule Take by mouth daily.     hydrALAZINE  (APRESOLINE ) 25 MG tablet TAKE ONE ( 1) TABLET BY MOUTH ( 25 MG) THREE TIMES DAILY AS NEEDED FOR BP GREATER THAN 155. 270 tablet 2   hydrOXYzine  (ATARAX /VISTARIL ) 10 MG tablet Take 10 mg by mouth.      irbesartan  (AVAPRO ) 300 MG tablet Take 1 tablet (300 mg total) by  mouth daily. 90 tablet 3   levothyroxine (SYNTHROID, LEVOTHROID) 25 MCG tablet Take 25 mcg by mouth daily before breakfast.     metoprolol  succinate (TOPROL  XL) 50 MG 24 hr tablet Take 1 tablet (50 mg total) by mouth daily. Take with or immediately following a meal. 90 tablet 3   metoprolol  tartrate (LOPRESSOR ) 50 MG tablet Take 1 tablet (50 mg total) by mouth as  directed. Take one tablet 2 hours before your Coronary CT scan 1 tablet 0   mirtazapine (REMERON) 15 MG tablet Take 15 mg by mouth at bedtime.     nitroGLYCERIN  (NITROSTAT ) 0.4 MG SL tablet Place 1 tablet (0.4 mg total) under the tongue every 5 (five) minutes as needed for chest pain. 25 tablet 3   omeprazole  (PRILOSEC) 40 MG capsule Take 40 mg by mouth daily.     rosuvastatin  (CRESTOR ) 10 MG tablet Take 1 tablet (10 mg total) by mouth daily. 90 tablet 3   No current facility-administered medications on file prior to visit.    Allergies  Allergen Reactions   Cortisone Anaphylaxis   Ibuprofen     Lidocaine      REACTION: seizure  PT STATES THIS IS SECONDARY TO ORAL LIDOCAINE  ONLY WHEN MIXED MYLANTA   Olanzapine     REACTION: unknown reaction    Last menstrual period 03/27/1984.   Assessment/Plan:  1. Hypertension -  Assessment: Blood pressure is above goal in clinic today Home blood pressures have been varying, she does have several readings in the 130s over 70s and some in the 120's/70's. Not taking medications at a consistent time Wearing her CPAP most of the time I do see blood pressures improving She has been more active, but it sounds like this might change come summer We discussed the importance of going to bed and waking up around the same time each day. We talked about the importance of taking medications consistently and a consistent time  Plan:  Take medications at a more consistent time Continue amlodipine  5mg  daily, metoprolol  succinate 50mg  daily and irbesartan  300mg  daily and hydralazine  25mg  three times  a day Increase physical activity Follow-up in 2 months  Doris [name], Pharm.Monika Annas, CPP [facility] A Division of [facility] [address] [place], [place] [address]  Phone: [phone]; Fax: [phone]

## 2023-07-15 ENCOUNTER — Ambulatory Visit: Admitting: Cardiology

## 2023-09-12 ENCOUNTER — Ambulatory Visit: Attending: Internal Medicine | Admitting: Pharmacist

## 2023-09-12 VITALS — BP 150/82

## 2023-09-12 DIAGNOSIS — I10 Essential (primary) hypertension: Secondary | ICD-10-CM | POA: Diagnosis present

## 2023-09-12 NOTE — Progress Notes (Signed)
 Patient ID: Doris [name]                 DOB: [birth year]                      MRN: [id number]      HPI: Doris [name] is a 78 y.o. female referred by Dr. Jeffrie to HTN clinic. PMH is significant for HTN, acid reflux, OA, HDL and CAD seen on CT. Seen in ER in Jan for hypertensive urgency. Her amlodipine  was increased to 10mg  but this caused edema. Seen by Dr. Jeffrie on 5/31. BP in office was 168/100.  At visit with pharmD on 6/25 blood pressure was 140/90. Patient reported that she feels off when her blood pressure is less than 140. We discussed that this may be her body not used to a lower blood pressure and assured her that this should improve after a few days. If symptoms persist then we can discuss adjusting the goal. Losartan  was stopped and irbesartan  150mg  daily was started. She was encouraged to increase her exercise.   At visit with PharmD on 09/02/22 clinic BP reading was well controlled although home readings were elevated. She was counseled on proper BP check technique as she had been checking first thing in the morning when sitting on the side of the bed. Brought in home BP cuff which was found to be accurate.   At visit with PharmD, BP was 138/80. Patient was counseled on taking her medications at a consistent time. Anxiety thought to be contributing to her elevated blood pressure. No medication changes were made.  Seen by Orren Fabry 8/22. Metoprolol  was increased to 50mg  daily. At visit 9/10 with PharmD, irbesartan  was increase to 300mg  daily.   Seen by Orren Fabry on 01/03/2023.  Hydralazine  25 mg 3 times daily as needed for systolic above 155 was started.  Patient seen in PharmD clinic 02/07/23. BP was variable at home. 132/80 in clinic. She was asked to take hydralazine  scheduled instead of prn.   Patient seen 04/07/2023.  Blood pressure in clinic was at goal, however her home readings were variable.  Unsure if this was due to improper technique or inconsistent use of  hydralazine .  She reported taking hydralazine  in the afternoon and evening but not on the morning.  She was asked to start taking 3 times a day in the morning at 9 AM, 6 PM and then at bedtime. Her home cuff was found to be accurate. Educated to rest prior to checking.  Patient last seen 05/05/23. She has started wearing CPAP and had seen improvement in her blood pressures. She was encouraged to take her hydralazine  3 times a day (9 AM, 6 PM, bedtime midnight). Saw Dr. Shlomo 5/9. BP was 132/78.  Last seen by me 07/12/23. BP was 144/82. Home reading were improving. We talked about taking medications at a consistent time.   She presents today for follow up.  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  But like to go back 1 day a week in the fall.  Her home blood pressures are much better with many of them at goal or just slightly above goal.  Blood pressure higher today in clinic, but patient worked up about losing her purse.  Her daughter bought her a desk cycle but she has not used it yet.    Current HTN meds: amlodipine  5mg  daily (AM), irbesartan  300mg  daily (AM), metoprolol  succinate 50mg  daily (PM), hydralazine  25mg  TID  Previously tried: amlodipine  10mg  daily (edema), quinapril  BP goal: <130/80  Family History:  Family History  Problem Relation Age of Onset   Heart disease Mother    Heart disease Father    Heart disease Other    Stroke Neg Hx    Cancer Neg Hx    Colon cancer Neg Hx     Social History: no tobacco, no ETOH  Diet:  Breakfast: ensure or cereal Supper: vegetables, meat alternatives , another ensure Vegetarian Snack: unsalted saltine crackers  Exercise:  Walks to get the mail- had been walking more but her plantar fascitis flared up Daughter bought her desk cycle  Home BP readings:  131/81, 131/76, 134/81, 128/83, 128/72, 136/74, 138/81, 112, 144/78, 136/76, 125/75, 132/83, 123/81, 127/78, 122/76, 129/73, 128/75, 127/80  Wt Readings from Last 3  Encounters:  06/24/23 166 lb 12.8 oz (75.7 kg)  03/17/23 160 lb (72.6 kg)  01/03/23 168 lb 12.8 oz (76.6 kg)   BP Readings from Last 3 Encounters:  07/12/23 (!) 144/82  06/24/23 132/78  05/12/23 117/69   Pulse Readings from Last 3 Encounters:  07/12/23 68  06/24/23 83  05/05/23 76    Renal function: CrCl cannot be calculated (Patient's most recent lab result is older than the maximum 21 days allowed.).  Past Medical History:  Diagnosis Date   Acid reflux    Back pain    HLD (hyperlipidemia)    Hypertension    Insomnia    Migraine    OSA on CPAP    severe obstructive sleep apnea with an AHI of 29.9/h.  On CPAP at 10cm H2O   Osteoarthritis    knee   Psychosis (HCC)    followed by Dr. Feliciano [phone]   Thyroid  nodule 02/16/2008   TSH 04/2008 = 0.296, no follow up noted in [facility]   Vertigo     Current Outpatient Medications on File Prior to Visit  Medication Sig Dispense Refill   amLODipine  (NORVASC ) 5 MG tablet Take 1 tablet (5 mg total) by mouth daily. 90 tablet 3   aspirin  EC 81 MG tablet Take 1 tablet (81 mg total) by mouth daily. Swallow whole.     busPIRone (BUSPAR) 7.5 MG tablet Take 7.5 mg by mouth at bedtime.     celecoxib (CELEBREX) 200 MG capsule Take 200 mg by mouth daily.     diclofenac Sodium (VOLTAREN) 1 % GEL Apply topically as needed.     DULoxetine (CYMBALTA) 60 MG capsule Take by mouth daily.     hydrALAZINE  (APRESOLINE ) 25 MG tablet TAKE ONE ( 1) TABLET BY MOUTH ( 25 MG) THREE TIMES DAILY AS NEEDED FOR BP GREATER THAN 155. 270 tablet 2   hydrOXYzine  (ATARAX /VISTARIL ) 10 MG tablet Take 10 mg by mouth.      irbesartan  (AVAPRO ) 300 MG tablet Take 1 tablet (300 mg total) by mouth daily. 90 tablet 3   levothyroxine (SYNTHROID, LEVOTHROID) 25 MCG tablet Take 25 mcg by mouth daily before breakfast.     metoprolol  succinate (TOPROL  XL) 50 MG 24 hr tablet Take 1 tablet (50 mg total) by mouth daily. Take with or immediately following a meal. 90 tablet 3    metoprolol  tartrate (LOPRESSOR ) 50 MG tablet Take 1 tablet (50 mg total) by mouth as directed. Take one tablet 2 hours before your Coronary CT scan 1 tablet 0   mirtazapine (REMERON) 15 MG tablet Take 15 mg by mouth at bedtime.     nitroGLYCERIN  (NITROSTAT ) 0.4 MG SL tablet Place 1  tablet (0.4 mg total) under the tongue every 5 (five) minutes as needed for chest pain. 25 tablet 3   omeprazole  (PRILOSEC) 40 MG capsule Take 40 mg by mouth daily.     rosuvastatin  (CRESTOR ) 10 MG tablet Take 1 tablet (10 mg total) by mouth daily. 90 tablet 3   No current facility-administered medications on file prior to visit.    Allergies  Allergen Reactions   Cortisone Anaphylaxis   Ibuprofen     Lidocaine      REACTION: seizure  PT STATES THIS IS SECONDARY TO ORAL LIDOCAINE  ONLY WHEN MIXED MYLANTA   Olanzapine     REACTION: unknown reaction    Last menstrual period 03/27/1984.   Assessment/Plan:  1. Hypertension -  Assessment: Blood pressure is above goal in clinic today Home blood pressures much improved.  Most of them at goal  Wearing her CPAP most of the time She is trying to be more active.  Daughter brought her a desk cycle as she plans to start using.  Having some fears about going back into the pool so may not do water aerobics  Plan:  Continue amlodipine  5mg  daily, metoprolol  succinate 50mg  daily and irbesartan  300mg  daily and hydralazine  25mg  three times a day Increase physical activity Follow-up as needed  Doris [name], Pharm.Doris [name], CPP [facility] A Division of [facility] [address] [place], [place] [address]  Phone: [phone]; Fax: [phone]

## 2023-09-12 NOTE — Patient Instructions (Addendum)
 Continue amlodipine  5mg  daily (AM), irbesartan  300mg  daily (AM), metoprolol  succinate 50mg  daily (PM), hydralazine  25mg  three times a day  Try to increase your physical activity- the desk cycle your daughter gave you is a great idea

## 2023-09-22 ENCOUNTER — Telehealth: Payer: Self-pay | Admitting: Cardiology

## 2023-09-22 DIAGNOSIS — R002 Palpitations: Secondary | ICD-10-CM

## 2023-09-22 NOTE — Telephone Encounter (Signed)
 Patient c/o Palpitations:  STAT if patient reporting lightheadedness, shortness of breath, or chest pain  How long have you had palpitations/irregular HR/ Afib? Are you having the symptoms now?   No  Are you currently experiencing lightheadedness, SOB or CP?   SOB - comes and goes  Do you have a history of afib (atrial fibrillation) or irregular heart rhythm?  Yes  Have you checked your BP or HR? (document readings if available):   Patient stated her BP has been doing good  Are you experiencing any other symptoms?  No   Patient is concerned she has been having weird sensation in her chest and SOB which subsides when she coughs.

## 2023-09-22 NOTE — Telephone Encounter (Signed)
 Spoke with pt who reports she is having shortness of breathing and feeling like her heart is fluttering or something is draining inside her chest.  She seems to feel it more frequently at night and when laying on her left side.  This has been occurring for several months.  Coughing does help resolve the sensation.  BP has been 120s/70s.  HR has been 70-80 bpm but she has not checked her heart rate or rhythm when she feels the fluttering.  HR does not always respond to activity but she believes that is r/t her medications.  She denies any dizziness, lightheadedness or passing out.  Advised to continue to monitor and try to check her heart rate when having the fluttering symptoms.  Aware I will forward this to Dr Jeffrie for his review and further orders.

## 2023-09-22 NOTE — Telephone Encounter (Signed)
 Patient identification verified by 2 forms.   Called and spoke to patient  Patient states:  -Can't describe it - feels like flutter, maybe something rushing through -Recent CT scan -Constant feeling -Pt is aware of the feeling -Pt wants to know if she needs to be seen in office.  Patient denies:  -Being able to really describe    Interventions/Plan: -Monitor symptoms at home -Send message to Dr. Jeffrie for review.   Reviewed ED warning signs/precautions  Patient agrees with plan, no questions at this time

## 2023-09-26 ENCOUNTER — Ambulatory Visit: Attending: Cardiology

## 2023-09-26 DIAGNOSIS — R002 Palpitations: Secondary | ICD-10-CM

## 2023-09-26 NOTE — Progress Notes (Unsigned)
 Enrolled for [employer] to mail a ZIO XT long term holter monitor to the patients address on file.   [id number] mailed to patient.  Patient made error in application. Mliss Miu at [facility] sent email to cancel charges and associate a new serial # to order.  Office inventory serial # [id number] applied to patient.

## 2023-09-26 NOTE — Telephone Encounter (Signed)
 Let's check a ZIO Thanks Oneil Parchment, MD  Spoke with pt who is aware of the order for Zio.  Order placed and pt is aware it will be mailed to her home address.  She will call back if any questions or concerns.

## 2023-09-28 ENCOUNTER — Other Ambulatory Visit: Payer: Self-pay | Admitting: Cardiology

## 2023-10-04 ENCOUNTER — Ambulatory Visit: Attending: Cardiology

## 2023-10-04 NOTE — Telephone Encounter (Signed)
 Pt needs a new heart monitor to be mailed out as she believes she made a mistake putting it on and it is not working. She would prefer to come into the office to have it put on her. Please advise.

## 2023-10-04 NOTE — Telephone Encounter (Signed)
 Patient scheduled to come in today at 2:30 PM to have a redo ZIO patch from office inventory applied.

## 2023-10-04 NOTE — Telephone Encounter (Signed)
 Will forward this message to our monitor dept/techs, for further management and assistance.

## 2023-10-09 ENCOUNTER — Other Ambulatory Visit: Payer: Self-pay | Admitting: Physician Assistant

## 2023-10-11 ENCOUNTER — Other Ambulatory Visit: Payer: Self-pay | Admitting: Physician Assistant

## 2023-10-12 MED ORDER — ROSUVASTATIN CALCIUM 10 MG PO TABS: 10.0000 mg | ORAL_TABLET | Freq: Every day | ORAL | 3 refills | Status: AC

## 2023-10-16 ENCOUNTER — Other Ambulatory Visit: Payer: Self-pay | Admitting: Physician Assistant

## 2023-11-09 ENCOUNTER — Telehealth: Payer: Self-pay | Admitting: Cardiology

## 2023-11-09 ENCOUNTER — Ambulatory Visit: Payer: Self-pay | Admitting: Cardiology

## 2023-11-09 DIAGNOSIS — R002 Palpitations: Secondary | ICD-10-CM

## 2023-11-09 NOTE — Telephone Encounter (Signed)
 I see that the patient had the order, but I do not see that this had resulted. Can you see if where the monitor might be in processing?

## 2023-11-09 NOTE — Telephone Encounter (Signed)
 Patient called in for heart monitor results. I did advise her that those results can take a while to get back. She got the monitor 08/11 and wore for 14 days, I told her it looked like the results were in process and she would get a call once we have an update. FYI

## 2023-11-11 NOTE — Telephone Encounter (Signed)
 Sinus rhythm, average heart rate 78 bpm   Brief episode, longest 2 minutes 8 seconds of atrial tachycardia   Rare PACs, rare PVCs   No evidence of atrial fibrillation, no pauses.   Triggered events of heart racing/shortness of breath were associated with sinus rhythm, no adverse arrhythmias.  Overall reassuring monitor.    Written by Oneil JAYSON Parchment, MD on 11/09/2023  3:47 PM EDT  Released to MyChart for pt's review.

## 2023-11-17 NOTE — Telephone Encounter (Signed)
 Pt has been notified of results as listed below.  She was appreciative of the call back and information.

## 2023-11-29 ENCOUNTER — Telehealth: Payer: Self-pay | Admitting: Cardiology

## 2023-11-29 MED ORDER — AMLODIPINE BESYLATE 5 MG PO TABS: 5.0000 mg | ORAL_TABLET | Freq: Every day | ORAL | 0 refills | Status: AC

## 2023-11-29 NOTE — Telephone Encounter (Signed)
 RX sent in

## 2023-11-29 NOTE — Telephone Encounter (Signed)
*  STAT* If patient is at the pharmacy, call can be transferred to refill team.   1. Which medications need to be refilled? (please list name of each medication and dose if known) amLODipine  (NORVASC ) 5 MG tablet   2. Which pharmacy/location (including street and city if local pharmacy) is medication to be sent to? [id number] [place], [place] - [address] [facility] [address] Phone: [phone]  Fax: [phone]      3. Do they need a 30 day or 90 day supply? Pt made appt for 02/21/23 please refill til then. Pt is out of medication

## 2023-12-27 ENCOUNTER — Other Ambulatory Visit: Payer: Self-pay | Admitting: Cardiology

## 2024-02-21 ENCOUNTER — Ambulatory Visit: Attending: Cardiology | Admitting: Cardiology

## 2024-02-21 ENCOUNTER — Telehealth: Payer: Self-pay | Admitting: Pharmacist

## 2024-02-21 VITALS — BP 148/86 | HR 78 | Ht 62.0 in | Wt 164.0 lb

## 2024-02-21 DIAGNOSIS — G4733 Obstructive sleep apnea (adult) (pediatric): Secondary | ICD-10-CM | POA: Diagnosis not present

## 2024-02-21 DIAGNOSIS — E785 Hyperlipidemia, unspecified: Secondary | ICD-10-CM | POA: Diagnosis not present

## 2024-02-21 DIAGNOSIS — I10 Essential (primary) hypertension: Secondary | ICD-10-CM | POA: Diagnosis not present

## 2024-02-21 DIAGNOSIS — R002 Palpitations: Secondary | ICD-10-CM | POA: Insufficient documentation

## 2024-02-21 NOTE — Progress Notes (Signed)
 " Cardiology Office Note:  .   Date:  02/21/2024  ID:  Doris [name], DOB [birth year], MRN [id number] PCP: Roanna Ezekiel NOVAK, MD  [facility] Providers Cardiologist:  Oneil Parchment, MD     History of Present Illness: .   Doris [name] is a 79 y.o. female Discussed the use of AI scribe  History of Present Illness Doris [name] is a 79 year old female with coronary artery disease and hypertension who presents for cardiovascular follow-up.  She has been feeling well overall, with only one episode of heart discomfort a couple of months ago while lying on her left side, which resolved upon turning to her right side. Previous heart monitoring showed a brief episode of atrial tachycardia lasting two minutes, rare PVCs and PACs, but no atrial fibrillation or pauses. She experienced shortness of breath associated with sinus rhythm and no adverse arrhythmias. She had issues with her Xio monitors but reports feeling good overall.  Her history of coronary artery disease includes a coronary CT in March 2025 showing a calcium  score of 749, placing her in the 93rd percentile. 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 FFR showed no flow-limiting lesions. She takes rosuvastatin  10 mg daily for lipid management.  Her hypertension is managed with amlodipine  5 mg daily, irbesartan  300 mg daily, metoprolol  succinate 50 mg in the evening, and hydralazine  25 mg as needed for blood pressure over 155. She monitors her blood pressure daily at home. She was last seen in the hypertension clinic in July 2025.  She has obstructive sleep apnea, which is treated with CPAP. She is trying to be more active.       Studies Reviewed: .        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  Diagnostic Ambulatory cardiac monitor: Two-minute episode of atrial tachycardia, rare premature ventricular contractions and premature atrial contractions, no atrial fibrillation, no pauses; symptomatic episodes associated with sinus rhythm and no adverse arrhythmias. FFR (04/2023): No flow limiting lesions. Risk Assessment/Calculations:           Physical Exam:   VS:  BP (!) 148/86 (BP Location: Right Arm, Patient Position: Sitting, Cuff Size: Normal)   Pulse 78   Ht 5' 2 (1.575 m)   Wt 164 lb (74.4 kg)   LMP 03/27/1984   SpO2 95%   BMI 30.00 kg/m    Wt Readings from Last 3 Encounters:  02/21/24 164 lb (74.4 kg)  06/24/23 166 lb 12.8 oz (75.7 kg)  03/17/23 160 lb (72.6 kg)    GEN: Well nourished, well developed in no acute distress NECK: No JVD; No carotid bruits CARDIAC: RRR, no murmurs, no rubs, no gallops RESPIRATORY:  Clear to auscultation without rales, wheezing or rhonchi  ABDOMEN: Soft, non-tender, non-distended EXTREMITIES:  No edema; No deformity   ASSESSMENT AND PLAN: .    Assessment and Plan Assessment & Plan Essential hypertension Blood pressure is well-controlled with current medication regimen. Regular home monitoring is being performed. - Continue current antihypertensive medications: amlodipine  5 mg daily, irbesartan  300 mg daily, metoprolol  succinate 50 mg in the evening,  hydralazine  25 mg as needed for blood pressure over 155. - Continue daily home blood pressure monitoring.  Coronary artery disease with coronary artery calcification Coronary CT showed a calcium  score of 749, in the 93rd percentile, with moderate atherosclerosis (50-69% calcified mid edema and 50-69% soft right posterior lateral  artery branch). FFR showed no flow-limiting lesions. Rosuvastatin  is being used to manage lipid levels and stabilize coronary arteries. - Continue rosuvastatin  10 mg daily.  Atrial tachycardia and premature beats Heart monitor showed brief episode of two minutes of atrial tachycardia, rare PVCs and PACs, but no atrial fibrillation and no pauses. No adverse arrhythmias associated with dyspnea. Symptoms are well-managed and not concerning. - Continue current management and monitoring.  Obstructive sleep apnea Managed with CPAP. She is trying to be more active. - Continue CPAP therapy. - Encouraged increased physical activity.         Dispo: 1 yr with APP  Signed, Oneil Parchment, MD  "

## 2024-02-21 NOTE — Patient Instructions (Addendum)
 Medication Instructions:  The current medical regimen is effective;  continue present plan and medications.  *If you need a refill on your cardiac medications before your next appointment, please call your pharmacy*  Follow-Up: At [facility], you and your health needs are our priority.  As part of our continuing mission to provide you with exceptional heart care, our providers are all part of one team.  This team includes your primary Cardiologist (physician) and Advanced Practice Providers or APPs (Physician Assistants and Nurse Practitioners) who all work together to provide you with the care you need, when you need it.  Your next appointment:   1 year(s)  Provider:   One of our Advanced Practice Providers (APPs): Morse Clause, PA-C  Lamarr Satterfield, NP Miriam Shams, NP  Olivia Pavy, PA-C Josefa Beauvais, NP  Leontine Salen, PA-C Orren Fabry, PA-C  Floyd, PA-C Ernest Dick, NP  Damien Braver, NP Jon Hails, PA-C  Waddell Donath, PA-C    Dayna Dunn, PA-C  Scott Weaver, PA-C Lum Louis, NP Katlyn West, NP Callie Goodrich, PA-C  Xika Zhao, NP Sheng Haley, PA-C    Kathleen Johnson, PA-C   Or Dr Jeffrie   We recommend signing up for the patient portal called MyChart.  Sign up information is provided on this After Visit Summary.  MyChart is used to connect with patients for Virtual Visits (Telemedicine).  Patients are able to view lab/test results, encounter notes, upcoming appointments, etc.  Non-urgent messages can be sent to your provider as well.   To learn more about what you can do with MyChart, go to [email or web address].

## 2024-03-13 ENCOUNTER — Other Ambulatory Visit: Payer: Self-pay | Admitting: Cardiology

## 2024-03-16 NOTE — Telephone Encounter (Signed)
 Pt of Dr. Jeffrie. Last CBC done in 2024. Does Dr. Jeffrie want to refill? Please advise.

## 2024-03-19 MED ORDER — HYDRALAZINE HCL 25 MG PO TABS: ORAL_TABLET | ORAL | 2 refills | Status: AC
